# Patient Record
Sex: Male | Born: 1935 | Race: White | Hispanic: No | Marital: Married | Smoking: Former smoker
Health system: Southern US, Community
[De-identification: ages and names within clinical notes are randomized; demographics above are authoritative.]

## PROBLEM LIST (undated history)

## (undated) DIAGNOSIS — K219 Gastro-esophageal reflux disease without esophagitis: Secondary | ICD-10-CM

## (undated) DIAGNOSIS — F329 Major depressive disorder, single episode, unspecified: Secondary | ICD-10-CM

## (undated) DIAGNOSIS — K449 Diaphragmatic hernia without obstruction or gangrene: Secondary | ICD-10-CM

## (undated) DIAGNOSIS — K5790 Diverticulosis of intestine, part unspecified, without perforation or abscess without bleeding: Secondary | ICD-10-CM

## (undated) DIAGNOSIS — D649 Anemia, unspecified: Secondary | ICD-10-CM

## (undated) DIAGNOSIS — M5430 Sciatica, unspecified side: Secondary | ICD-10-CM

## (undated) DIAGNOSIS — F32A Depression, unspecified: Secondary | ICD-10-CM

## (undated) DIAGNOSIS — R972 Elevated prostate specific antigen [PSA]: Secondary | ICD-10-CM

## (undated) DIAGNOSIS — M353 Polymyalgia rheumatica: Secondary | ICD-10-CM

## (undated) DIAGNOSIS — K279 Peptic ulcer, site unspecified, unspecified as acute or chronic, without hemorrhage or perforation: Secondary | ICD-10-CM

## (undated) DIAGNOSIS — F419 Anxiety disorder, unspecified: Secondary | ICD-10-CM

## (undated) DIAGNOSIS — N390 Urinary tract infection, site not specified: Secondary | ICD-10-CM

## (undated) DIAGNOSIS — E538 Deficiency of other specified B group vitamins: Secondary | ICD-10-CM

## (undated) DIAGNOSIS — M069 Rheumatoid arthritis, unspecified: Secondary | ICD-10-CM

## (undated) DIAGNOSIS — D126 Benign neoplasm of colon, unspecified: Secondary | ICD-10-CM

## (undated) DIAGNOSIS — G459 Transient cerebral ischemic attack, unspecified: Secondary | ICD-10-CM

## (undated) DIAGNOSIS — M1990 Unspecified osteoarthritis, unspecified site: Secondary | ICD-10-CM

## (undated) DIAGNOSIS — E785 Hyperlipidemia, unspecified: Secondary | ICD-10-CM

## (undated) DIAGNOSIS — I10 Essential (primary) hypertension: Secondary | ICD-10-CM

## (undated) DIAGNOSIS — I2510 Atherosclerotic heart disease of native coronary artery without angina pectoris: Secondary | ICD-10-CM

## (undated) DIAGNOSIS — K589 Irritable bowel syndrome without diarrhea: Secondary | ICD-10-CM

## (undated) HISTORY — DX: Gastro-esophageal reflux disease without esophagitis: K21.9

## (undated) HISTORY — DX: Sciatica, unspecified side: M54.30

## (undated) HISTORY — DX: Anemia, unspecified: D64.9

## (undated) HISTORY — DX: Deficiency of other specified B group vitamins: E53.8

## (undated) HISTORY — PX: HERNIA REPAIR: SHX51

## (undated) HISTORY — DX: Anxiety disorder, unspecified: F41.9

## (undated) HISTORY — DX: Irritable bowel syndrome, unspecified: K58.9

## (undated) HISTORY — DX: Benign neoplasm of colon, unspecified: D12.6

## (undated) HISTORY — DX: Essential (primary) hypertension: I10

## (undated) HISTORY — DX: Atherosclerotic heart disease of native coronary artery without angina pectoris: I25.10

## (undated) HISTORY — DX: Depression, unspecified: F32.A

## (undated) HISTORY — DX: Elevated prostate specific antigen (PSA): R97.20

## (undated) HISTORY — DX: Polymyalgia rheumatica: M35.3

## (undated) HISTORY — DX: Unspecified osteoarthritis, unspecified site: M19.90

## (undated) HISTORY — DX: Peptic ulcer, site unspecified, unspecified as acute or chronic, without hemorrhage or perforation: K27.9

## (undated) HISTORY — DX: Urinary tract infection, site not specified: N39.0

## (undated) HISTORY — DX: Diaphragmatic hernia without obstruction or gangrene: K44.9

## (undated) HISTORY — DX: Transient cerebral ischemic attack, unspecified: G45.9

## (undated) HISTORY — DX: Major depressive disorder, single episode, unspecified: F32.9

## (undated) HISTORY — DX: Rheumatoid arthritis, unspecified: M06.9

## (undated) HISTORY — DX: Hyperlipidemia, unspecified: E78.5

## (undated) HISTORY — DX: Diverticulosis of intestine, part unspecified, without perforation or abscess without bleeding: K57.90

## (undated) HISTORY — PX: CATARACT EXTRACTION: SUR2

---

## 1998-07-28 DIAGNOSIS — D126 Benign neoplasm of colon, unspecified: Secondary | ICD-10-CM

## 1998-07-28 HISTORY — DX: Benign neoplasm of colon, unspecified: D12.6

## 1998-08-08 ENCOUNTER — Other Ambulatory Visit: Admission: RE | Admit: 1998-08-08 | Discharge: 1998-08-08 | Payer: Self-pay | Admitting: Gastroenterology

## 1999-03-30 HISTORY — PX: CHOLECYSTECTOMY: SHX55

## 1999-08-10 ENCOUNTER — Encounter: Admission: RE | Admit: 1999-08-10 | Discharge: 1999-08-10 | Payer: Self-pay | Admitting: Family Medicine

## 1999-08-10 ENCOUNTER — Encounter: Payer: Self-pay | Admitting: Family Medicine

## 1999-08-14 ENCOUNTER — Ambulatory Visit (HOSPITAL_COMMUNITY): Admission: RE | Admit: 1999-08-14 | Discharge: 1999-08-15 | Payer: Self-pay

## 1999-08-14 ENCOUNTER — Encounter (INDEPENDENT_AMBULATORY_CARE_PROVIDER_SITE_OTHER): Payer: Self-pay | Admitting: Specialist

## 2002-05-01 ENCOUNTER — Encounter: Admission: RE | Admit: 2002-05-01 | Discharge: 2002-05-01 | Payer: Self-pay | Admitting: *Deleted

## 2002-06-14 ENCOUNTER — Ambulatory Visit (HOSPITAL_COMMUNITY): Admission: RE | Admit: 2002-06-14 | Discharge: 2002-06-14 | Payer: Self-pay | Admitting: Gastroenterology

## 2002-06-14 ENCOUNTER — Encounter: Payer: Self-pay | Admitting: Gastroenterology

## 2003-05-01 ENCOUNTER — Inpatient Hospital Stay (HOSPITAL_COMMUNITY): Admission: EM | Admit: 2003-05-01 | Discharge: 2003-05-02 | Payer: Self-pay | Admitting: Emergency Medicine

## 2004-03-29 HISTORY — PX: PTCA: SHX146

## 2004-04-15 ENCOUNTER — Ambulatory Visit: Payer: Self-pay | Admitting: Family Medicine

## 2004-04-17 ENCOUNTER — Ambulatory Visit: Payer: Self-pay | Admitting: Family Medicine

## 2004-05-04 ENCOUNTER — Ambulatory Visit: Payer: Self-pay | Admitting: Cardiology

## 2004-05-05 ENCOUNTER — Ambulatory Visit (HOSPITAL_COMMUNITY): Admission: RE | Admit: 2004-05-05 | Discharge: 2004-05-06 | Payer: Self-pay | Admitting: Cardiology

## 2004-05-05 ENCOUNTER — Ambulatory Visit: Payer: Self-pay | Admitting: Cardiology

## 2004-05-19 ENCOUNTER — Ambulatory Visit: Payer: Self-pay

## 2004-05-27 ENCOUNTER — Ambulatory Visit: Payer: Self-pay | Admitting: Family Medicine

## 2004-06-15 ENCOUNTER — Encounter (HOSPITAL_COMMUNITY): Admission: RE | Admit: 2004-06-15 | Discharge: 2004-09-13 | Payer: Self-pay | Admitting: Cardiology

## 2004-07-16 ENCOUNTER — Ambulatory Visit: Payer: Self-pay | Admitting: Cardiology

## 2004-07-17 ENCOUNTER — Ambulatory Visit: Payer: Self-pay | Admitting: Cardiology

## 2004-07-29 ENCOUNTER — Ambulatory Visit: Payer: Self-pay | Admitting: Family Medicine

## 2004-08-06 ENCOUNTER — Ambulatory Visit: Payer: Self-pay | Admitting: Cardiology

## 2004-08-08 ENCOUNTER — Emergency Department (HOSPITAL_COMMUNITY): Admission: EM | Admit: 2004-08-08 | Discharge: 2004-08-08 | Payer: Self-pay | Admitting: Emergency Medicine

## 2004-08-12 ENCOUNTER — Ambulatory Visit: Payer: Self-pay | Admitting: Family Medicine

## 2004-08-13 ENCOUNTER — Ambulatory Visit: Payer: Self-pay | Admitting: Cardiology

## 2004-08-18 ENCOUNTER — Ambulatory Visit: Payer: Self-pay | Admitting: Internal Medicine

## 2005-01-13 ENCOUNTER — Ambulatory Visit: Payer: Self-pay | Admitting: Family Medicine

## 2005-02-10 ENCOUNTER — Ambulatory Visit: Payer: Self-pay | Admitting: Family Medicine

## 2005-03-17 ENCOUNTER — Ambulatory Visit: Payer: Self-pay | Admitting: Family Medicine

## 2005-03-30 ENCOUNTER — Ambulatory Visit: Payer: Self-pay | Admitting: Cardiology

## 2005-05-12 ENCOUNTER — Ambulatory Visit: Payer: Self-pay | Admitting: Family Medicine

## 2005-05-27 ENCOUNTER — Ambulatory Visit: Payer: Self-pay | Admitting: Family Medicine

## 2005-05-28 ENCOUNTER — Ambulatory Visit: Payer: Self-pay | Admitting: Family Medicine

## 2005-06-01 ENCOUNTER — Ambulatory Visit: Payer: Self-pay | Admitting: Cardiology

## 2005-07-19 ENCOUNTER — Ambulatory Visit: Payer: Self-pay | Admitting: Cardiology

## 2005-09-14 ENCOUNTER — Ambulatory Visit: Payer: Self-pay | Admitting: Family Medicine

## 2005-10-06 ENCOUNTER — Ambulatory Visit: Payer: Self-pay | Admitting: Family Medicine

## 2005-10-28 ENCOUNTER — Ambulatory Visit: Payer: Self-pay | Admitting: Cardiology

## 2005-10-28 ENCOUNTER — Ambulatory Visit: Payer: Self-pay | Admitting: Family Medicine

## 2005-11-02 ENCOUNTER — Ambulatory Visit (HOSPITAL_COMMUNITY): Admission: RE | Admit: 2005-11-02 | Discharge: 2005-11-02 | Payer: Self-pay | Admitting: Urology

## 2005-12-29 ENCOUNTER — Ambulatory Visit: Payer: Self-pay | Admitting: Family Medicine

## 2006-06-23 ENCOUNTER — Ambulatory Visit: Payer: Self-pay | Admitting: Family Medicine

## 2006-06-23 LAB — CONVERTED CEMR LAB
ALT: 38 units/L (ref 0–40)
Bilirubin, Direct: 0.2 mg/dL (ref 0.0–0.3)
CO2: 32 meq/L (ref 19–32)
Calcium: 9.7 mg/dL (ref 8.4–10.5)
Cholesterol: 152 mg/dL (ref 0–200)
Creatinine, Ser: 1.1 mg/dL (ref 0.4–1.5)
GFR calc Af Amer: 83 mL/min
Glucose, Bld: 81 mg/dL (ref 70–99)
Potassium: 4.1 meq/L (ref 3.5–5.1)
Total Bilirubin: 1.5 mg/dL — ABNORMAL HIGH (ref 0.3–1.2)
Total CHOL/HDL Ratio: 4.1
Total Protein: 6.8 g/dL (ref 6.0–8.3)
Triglycerides: 73 mg/dL (ref 0–149)

## 2006-06-26 ENCOUNTER — Encounter: Admission: RE | Admit: 2006-06-26 | Discharge: 2006-06-26 | Payer: Self-pay | Admitting: Family Medicine

## 2006-06-30 ENCOUNTER — Ambulatory Visit: Payer: Self-pay | Admitting: Gastroenterology

## 2006-07-07 ENCOUNTER — Ambulatory Visit: Payer: Self-pay | Admitting: Gastroenterology

## 2006-07-07 ENCOUNTER — Encounter (INDEPENDENT_AMBULATORY_CARE_PROVIDER_SITE_OTHER): Payer: Self-pay | Admitting: Specialist

## 2006-07-13 ENCOUNTER — Ambulatory Visit: Payer: Self-pay | Admitting: Family Medicine

## 2006-07-22 ENCOUNTER — Ambulatory Visit: Payer: Self-pay | Admitting: Cardiology

## 2006-07-26 ENCOUNTER — Encounter: Payer: Self-pay | Admitting: Gastroenterology

## 2006-07-26 ENCOUNTER — Ambulatory Visit: Payer: Self-pay | Admitting: Gastroenterology

## 2006-07-28 ENCOUNTER — Ambulatory Visit: Payer: Self-pay | Admitting: Family Medicine

## 2006-10-19 DIAGNOSIS — E785 Hyperlipidemia, unspecified: Secondary | ICD-10-CM

## 2006-10-19 DIAGNOSIS — I2510 Atherosclerotic heart disease of native coronary artery without angina pectoris: Secondary | ICD-10-CM | POA: Insufficient documentation

## 2006-10-19 DIAGNOSIS — K279 Peptic ulcer, site unspecified, unspecified as acute or chronic, without hemorrhage or perforation: Secondary | ICD-10-CM | POA: Insufficient documentation

## 2006-10-19 DIAGNOSIS — Z8679 Personal history of other diseases of the circulatory system: Secondary | ICD-10-CM

## 2006-10-31 ENCOUNTER — Encounter: Payer: Self-pay | Admitting: Family Medicine

## 2007-02-02 ENCOUNTER — Encounter: Payer: Self-pay | Admitting: Family Medicine

## 2007-02-09 ENCOUNTER — Telehealth: Payer: Self-pay | Admitting: Family Medicine

## 2007-03-20 ENCOUNTER — Ambulatory Visit: Payer: Self-pay | Admitting: Gastroenterology

## 2007-03-30 HISTORY — PX: HIP FRACTURE SURGERY: SHX118

## 2007-04-11 ENCOUNTER — Telehealth: Payer: Self-pay | Admitting: Family Medicine

## 2007-04-25 ENCOUNTER — Inpatient Hospital Stay (HOSPITAL_COMMUNITY): Admission: EM | Admit: 2007-04-25 | Discharge: 2007-04-29 | Payer: Self-pay | Admitting: Emergency Medicine

## 2007-06-22 ENCOUNTER — Ambulatory Visit: Payer: Self-pay | Admitting: Family Medicine

## 2007-06-26 LAB — CONVERTED CEMR LAB
Basophils Absolute: 0.1 10*3/uL (ref 0.0–0.1)
Glucose, Bld: 96 mg/dL (ref 70–99)
Hemoglobin: 13.4 g/dL (ref 13.0–17.0)
Hgb A1c MFr Bld: 5.8 % (ref 4.6–6.0)
Lymphocytes Relative: 27.9 % (ref 12.0–46.0)
MCHC: 32.7 g/dL (ref 30.0–36.0)
Monocytes Relative: 7 % (ref 3.0–11.0)
Neutro Abs: 3.1 10*3/uL (ref 1.4–7.7)
Neutrophils Relative %: 61 % (ref 43.0–77.0)
RDW: 12.1 % (ref 11.5–14.6)

## 2007-07-25 ENCOUNTER — Ambulatory Visit: Payer: Self-pay | Admitting: Cardiology

## 2007-07-28 ENCOUNTER — Encounter: Payer: Self-pay | Admitting: Family Medicine

## 2007-07-28 ENCOUNTER — Ambulatory Visit: Payer: Self-pay

## 2007-07-28 LAB — CONVERTED CEMR LAB
ALT: 22 units/L (ref 0–53)
AST: 23 units/L (ref 0–37)
Albumin: 3.5 g/dL (ref 3.5–5.2)
Alkaline Phosphatase: 105 units/L (ref 39–117)
Cholesterol: 108 mg/dL (ref 0–200)
HDL: 29.4 mg/dL — ABNORMAL LOW (ref >39.0)
Total Protein: 7 g/dL (ref 6.0–8.3)
Triglycerides: 64 mg/dL (ref 0–149)

## 2007-08-02 ENCOUNTER — Ambulatory Visit: Payer: Self-pay | Admitting: Family Medicine

## 2007-08-30 ENCOUNTER — Ambulatory Visit: Payer: Self-pay | Admitting: Family Medicine

## 2007-10-09 ENCOUNTER — Ambulatory Visit: Payer: Self-pay | Admitting: Cardiology

## 2007-10-09 LAB — CONVERTED CEMR LAB
ALT: 24 units/L (ref 0–53)
Alkaline Phosphatase: 74 units/L (ref 39–117)
Bilirubin, Direct: 0.2 mg/dL (ref 0.0–0.3)
Cholesterol: 123 mg/dL (ref 0–200)
Total Bilirubin: 1.2 mg/dL (ref 0.3–1.2)
Total Protein: 6.8 g/dL (ref 6.0–8.3)
VLDL: 15 mg/dL (ref 0–40)

## 2007-11-21 ENCOUNTER — Ambulatory Visit: Payer: Self-pay | Admitting: Family Medicine

## 2007-11-22 ENCOUNTER — Ambulatory Visit: Payer: Self-pay | Admitting: Family Medicine

## 2007-11-22 LAB — CONVERTED CEMR LAB
LDL Cholesterol: 66 mg/dL (ref 0–99)
Total CHOL/HDL Ratio: 3.8
Triglycerides: 111 mg/dL (ref 0–149)

## 2007-11-23 ENCOUNTER — Encounter: Payer: Self-pay | Admitting: Family Medicine

## 2007-11-24 ENCOUNTER — Encounter: Admission: RE | Admit: 2007-11-24 | Discharge: 2007-11-24 | Payer: Self-pay | Admitting: Otolaryngology

## 2007-11-24 LAB — CONVERTED CEMR LAB
Eosinophils Relative: 2 % (ref 0.0–5.0)
HCT: 45.5 % (ref 39.0–52.0)
Hemoglobin: 15.4 g/dL (ref 13.0–17.0)
Lymphocytes Relative: 18.2 % (ref 12.0–46.0)
Monocytes Relative: 5.6 % (ref 3.0–12.0)
Neutro Abs: 6.2 10*3/uL (ref 1.4–7.7)
Platelets: 140 10*3/uL — ABNORMAL LOW (ref 150–400)
RDW: 12.5 % (ref 11.5–14.6)
WBC: 8.5 10*3/uL (ref 4.5–10.5)

## 2007-11-28 HISTORY — PX: SALIVARY STONE REMOVAL: SHX5213

## 2007-11-30 ENCOUNTER — Encounter (INDEPENDENT_AMBULATORY_CARE_PROVIDER_SITE_OTHER): Payer: Self-pay | Admitting: Otolaryngology

## 2007-11-30 ENCOUNTER — Ambulatory Visit (HOSPITAL_COMMUNITY): Admission: RE | Admit: 2007-11-30 | Discharge: 2007-12-01 | Payer: Self-pay | Admitting: Otolaryngology

## 2007-12-02 ENCOUNTER — Emergency Department (HOSPITAL_COMMUNITY): Admission: EM | Admit: 2007-12-02 | Discharge: 2007-12-02 | Payer: Self-pay | Admitting: Emergency Medicine

## 2007-12-05 ENCOUNTER — Telehealth: Payer: Self-pay | Admitting: Family Medicine

## 2008-01-23 ENCOUNTER — Ambulatory Visit: Payer: Self-pay | Admitting: Family Medicine

## 2008-01-23 DIAGNOSIS — M479 Spondylosis, unspecified: Secondary | ICD-10-CM | POA: Insufficient documentation

## 2008-01-25 ENCOUNTER — Ambulatory Visit: Payer: Self-pay | Admitting: Cardiology

## 2008-02-13 ENCOUNTER — Ambulatory Visit: Payer: Self-pay | Admitting: Family Medicine

## 2008-02-13 LAB — CONVERTED CEMR LAB
Bilirubin Urine: NEGATIVE
Blood in Urine, dipstick: NEGATIVE
Glucose, Urine, Semiquant: NEGATIVE
Urobilinogen, UA: 0.2
pH: 6.5

## 2008-02-14 ENCOUNTER — Encounter: Admission: RE | Admit: 2008-02-14 | Discharge: 2008-02-14 | Payer: Self-pay | Admitting: Family Medicine

## 2008-02-14 LAB — CONVERTED CEMR LAB
CO2: 33 meq/L — ABNORMAL HIGH (ref 19–32)
Chloride: 107 meq/L (ref 96–112)
GFR calc Af Amer: 92 mL/min
GFR calc non Af Amer: 76 mL/min
Glucose, Bld: 74 mg/dL (ref 70–99)
Sodium: 144 meq/L (ref 135–145)

## 2008-02-16 LAB — CONVERTED CEMR LAB
Albumin: 3.4 g/dL — ABNORMAL LOW (ref 3.5–5.2)
Calcium: 9.3 mg/dL (ref 8.4–10.5)
Folate: 10.1 ng/mL
GFR calc Af Amer: 92 mL/min
GFR calc non Af Amer: 76 mL/min
Glucose, Bld: 74 mg/dL (ref 70–99)
Phosphorus: 2.7 mg/dL (ref 2.3–4.6)
Sodium: 144 meq/L (ref 135–145)
Vitamin B-12: 170 pg/mL — ABNORMAL LOW (ref 211–911)

## 2008-02-20 ENCOUNTER — Ambulatory Visit: Payer: Self-pay | Admitting: Family Medicine

## 2008-02-27 ENCOUNTER — Ambulatory Visit: Payer: Self-pay | Admitting: Family Medicine

## 2008-02-27 DIAGNOSIS — R972 Elevated prostate specific antigen [PSA]: Secondary | ICD-10-CM

## 2008-02-27 DIAGNOSIS — F341 Dysthymic disorder: Secondary | ICD-10-CM | POA: Insufficient documentation

## 2008-02-28 ENCOUNTER — Ambulatory Visit: Payer: Self-pay | Admitting: Cardiology

## 2008-03-01 ENCOUNTER — Ambulatory Visit: Payer: Self-pay | Admitting: Family Medicine

## 2008-03-01 ENCOUNTER — Ambulatory Visit: Payer: Self-pay | Admitting: Cardiology

## 2008-03-05 ENCOUNTER — Ambulatory Visit: Payer: Self-pay | Admitting: Family Medicine

## 2008-03-05 LAB — CONVERTED CEMR LAB: PSA: 4.89 ng/mL — ABNORMAL HIGH (ref 0.10–4.00)

## 2008-03-06 ENCOUNTER — Ambulatory Visit: Payer: Self-pay | Admitting: Gastroenterology

## 2008-03-06 DIAGNOSIS — Z860100 Personal history of colon polyps, unspecified: Secondary | ICD-10-CM | POA: Insufficient documentation

## 2008-03-06 DIAGNOSIS — K219 Gastro-esophageal reflux disease without esophagitis: Secondary | ICD-10-CM

## 2008-03-13 ENCOUNTER — Ambulatory Visit: Payer: Self-pay | Admitting: Family Medicine

## 2008-03-14 ENCOUNTER — Ambulatory Visit: Payer: Self-pay | Admitting: Cardiology

## 2008-03-14 LAB — CONVERTED CEMR LAB
Albumin: 3.7 g/dL (ref 3.5–5.2)
Alkaline Phosphatase: 68 units/L (ref 39–117)
LDL Cholesterol: 71 mg/dL (ref 0–99)
Total Bilirubin: 0.7 mg/dL (ref 0.3–1.2)
Total CHOL/HDL Ratio: 3.5
Triglycerides: 60 mg/dL (ref 0–149)

## 2008-03-26 ENCOUNTER — Encounter: Payer: Self-pay | Admitting: Family Medicine

## 2008-03-26 ENCOUNTER — Ambulatory Visit: Payer: Self-pay | Admitting: Internal Medicine

## 2008-04-11 ENCOUNTER — Ambulatory Visit: Payer: Self-pay | Admitting: Family Medicine

## 2008-05-15 ENCOUNTER — Ambulatory Visit: Payer: Self-pay | Admitting: Family Medicine

## 2008-06-24 ENCOUNTER — Telehealth: Payer: Self-pay | Admitting: Gastroenterology

## 2008-06-25 ENCOUNTER — Ambulatory Visit: Payer: Self-pay | Admitting: Internal Medicine

## 2008-06-25 DIAGNOSIS — K5730 Diverticulosis of large intestine without perforation or abscess without bleeding: Secondary | ICD-10-CM | POA: Insufficient documentation

## 2008-07-08 ENCOUNTER — Telehealth: Payer: Self-pay | Admitting: Gastroenterology

## 2008-10-15 ENCOUNTER — Encounter (INDEPENDENT_AMBULATORY_CARE_PROVIDER_SITE_OTHER): Payer: Self-pay | Admitting: *Deleted

## 2008-10-16 ENCOUNTER — Telehealth: Payer: Self-pay | Admitting: Cardiology

## 2008-11-28 ENCOUNTER — Ambulatory Visit: Payer: Self-pay | Admitting: Cardiology

## 2009-02-25 ENCOUNTER — Ambulatory Visit: Payer: Self-pay | Admitting: Family Medicine

## 2009-03-06 ENCOUNTER — Telehealth: Payer: Self-pay | Admitting: Family Medicine

## 2009-03-07 ENCOUNTER — Telehealth: Payer: Self-pay | Admitting: Gastroenterology

## 2009-03-11 ENCOUNTER — Ambulatory Visit: Payer: Self-pay | Admitting: Gastroenterology

## 2009-04-09 ENCOUNTER — Ambulatory Visit: Payer: Self-pay | Admitting: Gastroenterology

## 2009-06-17 ENCOUNTER — Telehealth: Payer: Self-pay | Admitting: Cardiology

## 2009-06-18 ENCOUNTER — Telehealth (INDEPENDENT_AMBULATORY_CARE_PROVIDER_SITE_OTHER): Payer: Self-pay | Admitting: *Deleted

## 2009-06-19 ENCOUNTER — Ambulatory Visit: Payer: Self-pay

## 2009-06-19 ENCOUNTER — Ambulatory Visit: Payer: Self-pay | Admitting: Internal Medicine

## 2009-06-19 ENCOUNTER — Encounter (HOSPITAL_COMMUNITY): Admission: RE | Admit: 2009-06-19 | Discharge: 2009-08-27 | Payer: Self-pay | Admitting: Cardiology

## 2009-06-25 ENCOUNTER — Encounter: Payer: Self-pay | Admitting: Cardiology

## 2009-06-26 ENCOUNTER — Encounter: Payer: Self-pay | Admitting: Family Medicine

## 2009-06-27 ENCOUNTER — Ambulatory Visit: Payer: Self-pay | Admitting: Cardiology

## 2009-07-03 ENCOUNTER — Telehealth: Payer: Self-pay | Admitting: Gastroenterology

## 2009-07-09 ENCOUNTER — Ambulatory Visit: Payer: Self-pay | Admitting: Family Medicine

## 2009-07-09 DIAGNOSIS — E039 Hypothyroidism, unspecified: Secondary | ICD-10-CM | POA: Insufficient documentation

## 2009-07-09 DIAGNOSIS — H3530 Unspecified macular degeneration: Secondary | ICD-10-CM | POA: Insufficient documentation

## 2009-07-09 DIAGNOSIS — D649 Anemia, unspecified: Secondary | ICD-10-CM

## 2009-07-09 LAB — CONVERTED CEMR LAB
Blood in Urine, dipstick: NEGATIVE
Ketones, urine, test strip: NEGATIVE
Nitrite: NEGATIVE
Specific Gravity, Urine: >=1.03

## 2009-07-10 LAB — CONVERTED CEMR LAB
Alkaline Phosphatase: 92 units/L (ref 39–117)
Basophils Absolute: 0 10*3/uL (ref 0.0–0.1)
Bilirubin, Direct: 0.2 mg/dL (ref 0.0–0.3)
CO2: 30 meq/L (ref 19–32)
Calcium: 9.5 mg/dL (ref 8.4–10.5)
Creatinine, Ser: 1.2 mg/dL (ref 0.4–1.5)
Eosinophils Absolute: 0.2 10*3/uL (ref 0.0–0.7)
Glucose, Bld: 87 mg/dL (ref 70–99)
HDL: 42.3 mg/dL (ref >39.00)
Lymphocytes Relative: 24 % (ref 12.0–46.0)
MCHC: 34.1 g/dL (ref 30.0–36.0)
Neutrophils Relative %: 66 % (ref 43.0–77.0)
RDW: 13.5 % (ref 11.5–14.6)
Total CHOL/HDL Ratio: 2
Triglycerides: 47 mg/dL (ref 0.0–149.0)

## 2009-07-14 ENCOUNTER — Encounter: Payer: Self-pay | Admitting: Family Medicine

## 2009-07-15 ENCOUNTER — Ambulatory Visit: Payer: Self-pay | Admitting: Family Medicine

## 2009-07-15 LAB — CONVERTED CEMR LAB
OCCULT 1: NEGATIVE
OCCULT 3: NEGATIVE
PSA: 4.08 ng/mL — ABNORMAL HIGH (ref 0.10–4.00)

## 2009-07-16 ENCOUNTER — Telehealth: Payer: Self-pay | Admitting: Gastroenterology

## 2009-07-23 ENCOUNTER — Ambulatory Visit: Payer: Self-pay | Admitting: Family Medicine

## 2009-07-23 DIAGNOSIS — L719 Rosacea, unspecified: Secondary | ICD-10-CM

## 2009-07-23 DIAGNOSIS — R1310 Dysphagia, unspecified: Secondary | ICD-10-CM | POA: Insufficient documentation

## 2009-07-29 ENCOUNTER — Telehealth: Payer: Self-pay | Admitting: Family Medicine

## 2009-07-30 ENCOUNTER — Ambulatory Visit: Payer: Self-pay | Admitting: Gastroenterology

## 2009-08-06 ENCOUNTER — Ambulatory Visit: Payer: Self-pay | Admitting: Gastroenterology

## 2009-08-10 ENCOUNTER — Encounter: Payer: Self-pay | Admitting: Family Medicine

## 2009-08-11 ENCOUNTER — Encounter: Payer: Self-pay | Admitting: Gastroenterology

## 2009-08-12 ENCOUNTER — Ambulatory Visit: Payer: Self-pay | Admitting: Family Medicine

## 2009-08-21 ENCOUNTER — Ambulatory Visit: Payer: Self-pay | Admitting: Family Medicine

## 2009-08-22 ENCOUNTER — Telehealth: Payer: Self-pay | Admitting: Family Medicine

## 2009-09-18 ENCOUNTER — Ambulatory Visit: Payer: Self-pay | Admitting: Family Medicine

## 2009-11-11 ENCOUNTER — Ambulatory Visit: Payer: Self-pay | Admitting: Family Medicine

## 2009-12-23 ENCOUNTER — Ambulatory Visit: Payer: Self-pay | Admitting: Family Medicine

## 2009-12-23 DIAGNOSIS — B370 Candidal stomatitis: Secondary | ICD-10-CM

## 2010-02-05 ENCOUNTER — Ambulatory Visit: Payer: Self-pay | Admitting: Family Medicine

## 2010-02-05 DIAGNOSIS — R030 Elevated blood-pressure reading, without diagnosis of hypertension: Secondary | ICD-10-CM

## 2010-02-05 LAB — CONVERTED CEMR LAB
AST: 23 units/L (ref 0–37)
Albumin: 3.4 g/dL — ABNORMAL LOW (ref 3.5–5.2)
Alkaline Phosphatase: 91 units/L (ref 39–117)
Basophils Absolute: 0 10*3/uL (ref 0.0–0.1)
Bilirubin, Direct: 0.1 mg/dL (ref 0.0–0.3)
Eosinophils Relative: 3 % (ref 0.0–5.0)
GFR calc non Af Amer: 76.25 mL/min (ref >60)
Glucose, Bld: 111 mg/dL — ABNORMAL HIGH (ref 70–99)
MCV: 91.6 fL (ref 78.0–100.0)
Monocytes Absolute: 0.2 10*3/uL (ref 0.1–1.0)
Neutrophils Relative %: 64.1 % (ref 43.0–77.0)
Platelets: 159 10*3/uL (ref 150.0–400.0)
Potassium: 3.6 meq/L (ref 3.5–5.1)
Sodium: 143 meq/L (ref 135–145)
Total Bilirubin: 0.5 mg/dL (ref 0.3–1.2)
WBC: 5.1 10*3/uL (ref 4.5–10.5)

## 2010-02-11 ENCOUNTER — Telehealth: Payer: Self-pay | Admitting: Family Medicine

## 2010-03-16 ENCOUNTER — Telehealth: Payer: Self-pay | Admitting: Gastroenterology

## 2010-03-16 ENCOUNTER — Ambulatory Visit: Payer: Self-pay | Admitting: Internal Medicine

## 2010-04-16 ENCOUNTER — Ambulatory Visit
Admission: RE | Admit: 2010-04-16 | Discharge: 2010-04-16 | Payer: Self-pay | Source: Home / Self Care | Attending: Family Medicine | Admitting: Family Medicine

## 2010-04-16 DIAGNOSIS — R05 Cough: Secondary | ICD-10-CM | POA: Insufficient documentation

## 2010-04-16 DIAGNOSIS — R413 Other amnesia: Secondary | ICD-10-CM | POA: Insufficient documentation

## 2010-04-22 ENCOUNTER — Telehealth: Payer: Self-pay | Admitting: Family Medicine

## 2010-04-30 NOTE — Letter (Signed)
Summary: EGD Instructions  [facility]  [address] [place], [place] [address]   Phone: [phone]  Fax: [phone]       [name]    1923/02/10    MRN: [id number]       Procedure Day Dorna Bloom: Wednesday May 11th, 2011     Arrival Time:  2:30pm     Procedure Time: 3:30pm     Location of Procedure:                    _ x _ [facility] (4th Floor)    PREPARATION FOR ENDOSCOPY   On 08/06/09 THE DAY OF THE PROCEDURE:  1.   No solid foods, milk or milk products are allowed after midnight the night before your procedure.  2.   Do not drink anything colored red or purple.  Avoid juices with pulp.  No orange juice.  3.  You may drink clear liquids until 1:30pm, which is 2 hours before your procedure.                                                                                                CLEAR LIQUIDS INCLUDE: Water Jello Ice Popsicles Tea (sugar ok, no milk/cream) Powdered fruit flavored drinks Coffee (sugar ok, no milk/cream) Gatorade Juice: apple, white grape, white cranberry  Lemonade Clear bullion, consomm, broth Carbonated beverages (any kind) Strained chicken noodle soup Hard Candy   MEDICATION INSTRUCTIONS  Unless otherwise instructed, you should take regular prescription medications with a small sip of water as early as possible the morning of your procedure.         OTHER INSTRUCTIONS  You will need a responsible adult at least 75 years of age to accompany you and drive you home.   This person must remain in the waiting room during your procedure.  Wear loose fitting clothing that is easily removed.  Leave jewelry and other valuables at home.  However, you may wish to bring a book to read or an iPod/MP3 player to listen to music as you wait for your procedure to start.  Remove all body piercing jewelry and leave at home.  Total time from sign-in until discharge is approximately 2-3 hours.  You should go home  directly after your procedure and rest.  You can resume normal activities the day after your procedure.  The day of your procedure you should not:   Drive   Make legal decisions   Operate machinery   Drink alcohol   Return to work  You will receive specific instructions about eating, activities and medications before you leave.    The above instructions have been reviewed and explained to me by   Marchelle Folks.     I fully understand and can verbalize these instructions _____________________________ Date _________

## 2010-04-30 NOTE — Assessment & Plan Note (Signed)
Summary: wife concerned about meds/pt rsc/cjr   Vital Signs:  Patient profile:   77 year old male Weight:      168 pounds BMI:     23.52 O2 Sat:      98 % Temp:     98 degrees F Pulse rate:   65 / minute BP sitting:   120 / 70  (left arm)  Vitals Entered By: Pura Spice, RN (Aug 12, 2009 11:53 AM) CC: discuss visit from dr hochrein and dr stark. wife concerned    History of Present Illness: This 77 year old white male is in with his wife to discuss his medical problems and also her concern regarding taking too many medications. She also relates that he is for anxious apprehensive and many times will raise his voice to her We will go over one medicine at a time and decide if there is any that we can leave off We have discussed his office visit with Dr. Margarite Gouge which reveals that he is not having any ischemia or cardiac problem causing any chest pain but relates the pain to esophageal spasm or GERD He has had upper endoscopy by Dr. Russella Dar which only revealed a hiatal hernia and has had esophagitis in the past but is relieved with Nexium b.i.d. She is anxiety depression has improved slightly with Lexapro 10 mg each day but it seems it needs to be increased in dosage No other complaints  Allergies: 1)  ! * Pcn Derivatives 2)  ! * Int. Crestor 3)  Amoxicillin (Amoxicillin) 4)  Erythromycin (Erythromycin)  Past History:  Past Medical History: Last updated: 04/09/2009 CONSTIPATION (ICD-564.00) IRRITABLE BOWEL SYNDROME (ICD-564.1) HEMORRHOIDS (ICD-455.6) DIVERTICULAR DISEASE (ICD-562.10) GASTRITIS (ICD-535.50) PERSONAL HX COLONIC POLYPS (ICD-V12.72) GERD (ICD-530.81) ANXIETY DEPRESSION (ICD-300.4) ELEVATED PROSTATE SPECIFIC ANTIGEN (ICD-790.93) VITAMIN B12 DEFICIENCY (ICD-266.2) UTI (ICD-599.0) ARTHRITIS, CERVICAL SPINE (ICD-721.90) SCIATICA (ICD-724.3) ANEMIA (ICD-285.9) TRANSIENT ISCHEMIC ATTACK, HX OF (ICD-V12.50) PEPTIC ULCER DISEASE (ICD-533.90) HYPERLIPIDEMIA  (ICD-272.4) CORONARY ARTERY DISEASE (ICD-414.00) (PTCA and stenting of proximal LAD with 75%     stenosis reduced to 0%.  This was in 2006.  Stress perfusion study     in April 2009 demonstrated well-preserved ejection fraction with no     evidence of ischemia or infarct)  Past Surgical History: Last updated: 04/09/2009 Cataract extraction-left eye Cholecystectomy Hernia Surgery Hip surgery after fracture 1/09-right Rt. sub. mand. gland extraction 9/09 Stent-05/06/2004 EGD-07/26/2006 Colonoscopy-07/07/2006  Social History: Last updated: 04/09/2009 Retired  Married Former Smoker-stopped 1974 Alcohol use-yes-occasional Drug use-no Daily Caffeine Use 1 cup/day Patient gets regular exercise.  Risk Factors: Smoking Status: quit (10/19/2006)  Review of Systems  The patient denies anorexia, fever, weight loss, weight gain, vision loss, decreased hearing, hoarseness, chest pain, syncope, dyspnea on exertion, peripheral edema, prolonged cough, headaches, hemoptysis, abdominal pain, melena, hematochezia, severe indigestion/heartburn, hematuria, incontinence, genital sores, muscle weakness, suspicious skin lesions, transient blindness, difficulty walking, depression, unusual weight change, abnormal bleeding, enlarged lymph nodes, angioedema, breast masses, and testicular masses.    Physical Exam  General:  Well-developed,well-nourished,in no acute distress; alert,appropriate and cooperative throughout examination Lungs:  Normal respiratory effort, chest expands symmetrically. Lungs are clear to auscultation, no crackles or wheezes. Heart:  Normal rate and regular rhythm. S1 and S2 normal without gallop, murmur, click, rub or other extra sounds. Abdomen:  Bowel sounds positive,abdomen soft and non-tender without masses, organomegaly or hernias noted. Skin:  rosacea has improved but continues to persist Psych:  appears anxious at times but also depressed   Impression &  Recommendations:  Problem # 1:  ROSACEA (ICD-695.3) Assessment Improved  Problem # 2:  DYSPHAGIA UNSPECIFIED (ICD-787.20) Assessment: Improved  Problem # 3:  ANXIETY DEPRESSION (ICD-300.4) Assessment: Unchanged increase Lexapro to 20 mg q. day  Problem # 4:  CORONARY ARTERY DISEASE (ICD-414.00) Assessment: Improved  His updated medication list for this problem includes:    Metoprolol Tartrate 25 Mg Tabs (Metoprolol tartrate) .Marland Kitchen... Take 1 tablet by mouth once a day    Nitroquick 0.4 Mg Subl (Nitroglycerin) ..... Use as directed    Bayer Aspirin Ec Low Dose 81 Mg Tbec (Aspirin) .Marland Kitchen... 2 po two times a day  Problem # 5:  GERD (ICD-530.81) Assessment: Improved  His updated medication list for this problem includes:    Nexium 40 Mg Cpdr (Esomeprazole magnesium) .Marland Kitchen... Take 1 capsule by mouth two times a day  Complete Medication List: 1)  Metoprolol Tartrate 25 Mg Tabs (Metoprolol tartrate) .... Take 1 tablet by mouth once a day 2)  Nexium 40 Mg Cpdr (Esomeprazole magnesium) .... Take 1 capsule by mouth two times a day 3)  Nitroquick 0.4 Mg Subl (Nitroglycerin) .... Use as directed 4)  Bayer Aspirin Ec Low Dose 81 Mg Tbec (Aspirin) .... 2 po two times a day 5)  Lipitor 40 Mg Tabs (Atorvastatin calcium) .... Once daily 6)  Tricor 145 Mg Tabs (Fenofibrate) .... Daily 7)  Miralax Powd (Polyethylene glycol 3350) .Marland Kitchen.. 1 measure  each day am or pm for a regular bm 8)  Ocuvite Preservision Tabs (Multiple vitamins-minerals) 9)  Lexapro 10 Mg Tabs (Escitalopram oxalate) .Marland Kitchen.. 1 once daily for anxiety and depression 10)  Vitamin D (ergocalciferol) 50000 Unit Caps (Ergocalciferol) .Marland KitchenMarland KitchenMarland Kitchen 1 weekly for 12 weeks 11)  Triamcinolone in Absorbase 0.05 % Oint (Triamcinolone acetonide) .... Apply once daily to affected area 12)  Cetaphil Defense Spf 50 Crea (Sunscreens) .... Apply daily 13)  Lexapro 20 Mg Tabs (Escitalopram oxalate) .Marland Kitchen.. 1 once daily for anxiety depression  Patient Instructions: 1)   Reviewed all her medications I feel that we cannot this continue any of them but increased Lexapro 20 mg 2)  Continue your medications as prescribed 3)  Return in 6 weeks to evaluate pain on a depression Prescriptions: LEXAPRO 20 MG TABS (ESCITALOPRAM OXALATE) 1 once daily for anxiety depression  #30 x 11   Entered and Authorized by:   Judithann Sheen MD   Signed by:   Judithann Sheen MD on 08/12/2009   Method used:   Electronically to        [id number] (retail)       [address]       [place], [place]  [address]       Ph: [phone] or [phone]       Fax: [phone]   RxID:   [id number]

## 2010-04-30 NOTE — Assessment & Plan Note (Signed)
Summary: cough/dm   Vital Signs:  Patient profile:   77 year old male Weight:      181 pounds O2 Sat:      96 % on Room air Temp:     98.4 degrees F oral Pulse rate:   70 / minute Pulse rhythm:   regular BP sitting:   120 / 80  (left arm) Cuff size:   regular  Vitals Entered By: Romualdo Bolk, CMA (AAMA) (April 16, 2010 2:12 PM)  O2 Flow:  Room air CC: Coughing x 3 weeks, lump on throat when eats and congestion   History of Present Illness: This 77 year old white married male he is in today complaining of cough for approximately 3 weeks. He had denied cough but he feels that there's a lump in his throat also the cough was related to heat after eating he will have a call. He also has a problem with nasal congestion and postnasal drainage which also aggravates a call he does take Mucinex DM with some help here didn't help him he take Nexium 40 mg b.i.d. to help esophageal reflux with associated cough Another problem he has been concerned about his memory and in fact it he got lost colon to the gym recently he could not find his wife this has happened worrying frequently but we discussed the fact for him to think about the possibility of taking the medication as there is  Current Medications (verified): 1)  Metoprolol Tartrate 25 Mg Tabs (Metoprolol Tartrate) .... Take 1 Tablet By Mouth Once A Day 2)  Nexium 40 Mg Cpdr (Esomeprazole Magnesium) .... Take 1 Capsule By Mouth Two Times A Day 3)  Nitroquick 0.4 Mg Subl (Nitroglycerin) .... Use As Directed 4)  Bayer Aspirin Ec Low Dose 81 Mg Tbec (Aspirin) .... 2 Po Two Times A Day 5)  Lipitor 40 Mg Tabs (Atorvastatin Calcium) .... Once Daily 6)  Tricor 145 Mg Tabs (Fenofibrate) .... Daily 7)  Miralax  Powd (Polyethylene Glycol 3350) .Marland Kitchen.. 1 Measure  Each Day Am or Pm For A Regular Bm 8)  Ocuvite Preservision  Tabs (Multiple Vitamins-Minerals) 9)  Triamcinolone in Absorbase 0.05 % Oint (Triamcinolone Acetonide) .... Apply Once Daily To  Affected Area 10)  Cetaphil Defense Spf 50  Crea (Sunscreens) .... Apply Daily 11)  Mucinex Fast-Max Cold & Sinus 10-650-400 Mg/40ml Liqd (Phenylephrine-Apap-Guaifenesin)  Allergies (verified): 1)  ! * Pcn Derivatives 2)  ! * Int. Crestor 3)  ! * Doxycycline 4)  ! Bactrim 5)  Amoxicillin (Amoxicillin) 6)  Erythromycin (Erythromycin)  Past History:  Past Medical History: Last updated: 04/09/2009 CONSTIPATION (ICD-564.00) IRRITABLE BOWEL SYNDROME (ICD-564.1) HEMORRHOIDS (ICD-455.6) DIVERTICULAR DISEASE (ICD-562.10) GASTRITIS (ICD-535.50) PERSONAL HX COLONIC POLYPS (ICD-V12.72) GERD (ICD-530.81) ANXIETY DEPRESSION (ICD-300.4) ELEVATED PROSTATE SPECIFIC ANTIGEN (ICD-790.93) VITAMIN B12 DEFICIENCY (ICD-266.2) UTI (ICD-599.0) ARTHRITIS, CERVICAL SPINE (ICD-721.90) SCIATICA (ICD-724.3) ANEMIA (ICD-285.9) TRANSIENT ISCHEMIC ATTACK, HX OF (ICD-V12.50) PEPTIC ULCER DISEASE (ICD-533.90) HYPERLIPIDEMIA (ICD-272.4) CORONARY ARTERY DISEASE (ICD-414.00) (PTCA and stenting of proximal LAD with 75%     stenosis reduced to 0%.  This was in 2006.  Stress perfusion study     in April 2009 demonstrated well-preserved ejection fraction with no     evidence of ischemia or infarct)  Past Surgical History: Last updated: 04/09/2009 Cataract extraction-left eye Cholecystectomy Hernia Surgery Hip surgery after fracture 1/09-right Rt. sub. mand. gland extraction 9/09 Stent-05/06/2004 EGD-07/26/2006 Colonoscopy-07/07/2006  Family History: Last updated: 04/09/2009 Family History Hypertension Family History of Cardiovascular disorder Family History of Colon Cancer: Mat. Uncle ?50  yo Family History of Coronary Artery Disease: Father  Social History: Last updated: 04/09/2009 Retired  Married Former Smoker-stopped 1974 Alcohol use-yes-occasional Drug use-no Daily Caffeine Use 1 cup/day Patient gets regular exercise.  Risk Factors: Smoking Status: quit (10/19/2006)  Review of Systems       See HPI  Physical Exam  General:  Well-developed,well-nourished,in no acute distress; alert,appropriate and cooperative throughout examination Head:  Normocephalic and atraumatic without obvious abnormalities. No apparent alopecia or balding. Eyes:  No corneal or conjunctival inflammation noted. EOMI. Perrla. Funduscopic exam benign, without hemorrhages, exudates or papilledema. Vision grossly normal. Ears:  External ear exam shows no significant lesions or deformities.  Otoscopic examination reveals clear canals, tympanic membranes are intact bilaterally without bulging, retraction, inflammation or discharge. Hearing is grossly normal bilaterally. Nose:  nasal mucosa boggy swollen draining clear fluid some nasal congestion Mouth:  postnasal drainage Neck:  No deformities, masses, or tenderness noted. Lungs:  Normal respiratory effort, chest expands symmetrically. Lungs are clear to auscultation, no crackles or wheezes. Heart:  Normal rate and regular rhythm. S1 and S2 normal without gallop, murmur, click, rub or other extra sounds. Abdomen:  Bowel sounds positive,abdomen soft and non-tender without masses, organomegaly or hernias noted.   Impression & Recommendations:  Problem # 1:  MEMORY LOSS (ICD-780.93) Assessment New  Aricept 5 mg for one month and increase to 10 mg  Orders: Prescription Created Electronically [phone])  Problem # 2:  COUGH (ICD-786.2) Assessment: Deteriorated  Problem # 3:  MACULAR DEGENERATION (ICD-362.50) Assessment: Unchanged  Problem # 4:  GERD (ICD-530.81)  His updated medication list for this problem includes:    Nexium 40 Mg Cpdr (Esomeprazole magnesium) .Marland Kitchen... Take 1 capsule by mouth two times a day  Problem # 5:  CORONARY ARTERY DISEASE (ICD-414.00) Assessment: Improved  His updated medication list for this problem includes:    Metoprolol Tartrate 25 Mg Tabs (Metoprolol tartrate) .Marland Kitchen... Take 1 tablet by mouth once a day    Nitroquick 0.4 Mg  Subl (Nitroglycerin) ..... Use as directed    Bayer Aspirin Ec Low Dose 81 Mg Tbec (Aspirin) .Marland Kitchen... 2 po two times a day  Complete Medication List: 1)  Metoprolol Tartrate 25 Mg Tabs (Metoprolol tartrate) .... Take 1 tablet by mouth once a day 2)  Nexium 40 Mg Cpdr (Esomeprazole magnesium) .... Take 1 capsule by mouth two times a day 3)  Nitroquick 0.4 Mg Subl (Nitroglycerin) .... Use as directed 4)  Bayer Aspirin Ec Low Dose 81 Mg Tbec (Aspirin) .... 2 po two times a day 5)  Lipitor 40 Mg Tabs (Atorvastatin calcium) .... Once daily 6)  Tricor 145 Mg Tabs (Fenofibrate) .... Daily 7)  Miralax Powd (Polyethylene glycol 3350) .Marland Kitchen.. 1 measure  each day am or pm for a regular bm 8)  Ocuvite Preservision Tabs (Multiple vitamins-minerals) 9)  Triamcinolone in Absorbase 0.05 % Oint (Triamcinolone acetonide) .... Apply once daily to affected area 10)  Cetaphil Defense Spf 50 Crea (Sunscreens) .... Apply daily 11)  Mucinex Fast-max Cold & Sinus 10-650-400 Mg/2ml Liqd (Phenylephrine-apap-guaifenesin) 12)  Nasonex 50 Mcg/act Susp (Mometasone furoate) .... 2 sprays eavh nostril qd 13)  Medrol (pak) 4 Mg Tabs (Methylprednisolone) .... As directed package 14)  Aricept 5 Mg Tabs (Donepezil hcl) .Marland Kitchen.. 1 once daily then reill with 10mg  tablets 15)  Hydromet 5-1.5 Mg/78ml Syrp (Hydrocodone-homatropine) .Marland Kitchen.. 1-2 tsp q4h as needed for cough  Patient Instructions: 1)  cOUGH SECONDARY TO POSTNAL DRAINAGE AS WELL AS RELATED TO ESOPHAGEAL REFLUX 2)  hYDROMET  COUGH EXPECTORANT 3)  MEDROL DOSEPAK 4)  sTART USING NASONEX DAILY 2 SPRAYS EACH NOSTRIL 5)  TO START ARICEPT 10 MG EACH DAY TO HELP MEMORY LOSS 6)  CONTINUE TAKING MUCINEX dm Prescriptions: HYDROMET 5-1.5 MG/5ML SYRP (HYDROCODONE-HOMATROPINE) 1-2 TSP Q4H as needed FOR COUGH  #230CC x 2   Entered and Authorized by:   Judithann Sheen MD   Signed by:   Judithann Sheen MD on 04/16/2010   Method used:   Print then Give to Patient   RxID:    [id number] ARICEPT 5 MG TABS (DONEPEZIL HCL) 1 once daily THEN REILL WITH 10MG  TABLETS  #30 x 11   Entered and Authorized by:   Judithann Sheen MD   Signed by:   Judithann Sheen MD on 04/16/2010   Method used:   Electronically to        [id number] ([facility]ail)       [address]       [place], [place]  [address]       Ph: [phone] or [phone]       Fax: [phone]   RxID:   [id number] MEDROL (PAK) 4 MG TABS (METHYLPREDNISOLONE) AS DIRECTED PACKAGE  #1 x 1   Entered and Authorized by:   Judithann Sheen MD   Signed by:   Judithann Sheen MD on 04/16/2010   Method used:   Electronically to        [id number] (retail)       [address]       [place], [place]  [address]       Ph: [phone] or [phone]       Fax: [phone]   RxID:   [id number]    Orders Added: 1)  Prescription Created Electronically [G8553] 2)  Est. Patient Level IV [62831]

## 2010-04-30 NOTE — Assessment & Plan Note (Signed)
Summary: problems with tonsils/cjr rsc due to car trouble/njr   Vital Signs:  Patient profile:   77 year old male Height:      71 inches (180.34 cm) Weight:      168.31 pounds (76.50 kg) O2 Sat:      97 % on Room air Temp:     98.3 degrees F (36.83 degrees C) oral Pulse rate:   85 / minute BP sitting:   150 / 64  (left arm) Cuff size:   regular  Vitals Entered By: Josph Macho RMA (February 05, 2010 9:12 AM)  O2 Flow:  Room air  Serial Vital Signs/Assessments:  Time      Position  BP       Pulse  Resp  Temp     By                     138/78                         Danise Edge MD  CC: problem w/tonsils on and off (discuss with MD)/ CF Is Patient Diabetic? No   History of Present Illness: 77 yo Caucasian male in today for evaluation of an insect bite that has previously been treated with an antibiotic in the past.  He is concerned about some irritation on the tip of his tongue, this irritation is slowly improving but still present. He reports this has been present since a TIA in 1991. He first noting tingling at the tip of the tongue then and it has never fully resolved. He never has seen any white plaque or obvious sores or lesions. He notes the mouthwash he was given at his last visit was somewhat helpful. He is denying any pharyngitis, dyspepsia, reflux, CP, palp, SOB, GI or GU c/o. He is concerned about some recurrent skin lesions he believes were bug bites and he worries he could still be dealing with some residual secondary infections but he denies any f/c/malaise/anorexia/fatigue.  Current Medications (verified): 1)  Metoprolol Tartrate 25 Mg Tabs (Metoprolol Tartrate) .... Take 1 Tablet By Mouth Once A Day 2)  Nexium 40 Mg Cpdr (Esomeprazole Magnesium) .... Take 1 Capsule By Mouth Two Times A Day 3)  Nitroquick 0.4 Mg Subl (Nitroglycerin) .... Use As Directed 4)  Bayer Aspirin Ec Low Dose 81 Mg Tbec (Aspirin) .... 2 Po Two Times A Day 5)  Lipitor 40 Mg Tabs (Atorvastatin  Calcium) .... Once Daily 6)  Tricor 145 Mg Tabs (Fenofibrate) .... Daily 7)  Miralax  Powd (Polyethylene Glycol 3350) .Marland Kitchen.. 1 Measure  Each Day Am or Pm For A Regular Bm 8)  Ocuvite Preservision  Tabs (Multiple Vitamins-Minerals) 9)  Triamcinolone in Absorbase 0.05 % Oint (Triamcinolone Acetonide) .... Apply Once Daily To Affected Area 10)  Cetaphil Defense Spf 50  Crea (Sunscreens) .... Apply Daily 11)  Clarinex 5 Mg Tabs (Desloratadine) .Marland Kitchen.. 1 Qd 12)  Clobetasol Propionate 0.05 % Oint (Clobetasol Propionate) 13)  Medrol 8 Mg Tabs (Methylprednisolone) .Marland Kitchen.. 1 Two Times A Day Pc For 7 Days Then 1 Once Daily For 11 Days 14)  Hydroxyzine Hcl 25 Mg Tabs (Hydroxyzine Hcl) .Marland Kitchen.. 1 Morn Midaternoon and Hs To Prevent Rash and Itching 15)  Diflucan 100 Mg Tabs (Fluconazole) .Marland Kitchen.. 1 Tab By Mouth Q Week X 2 16)  First-Dukes Mouthwash  Susp (Diphenhyd-Hydrocort-Nystatin) .... 5 Cc By Mouth 4 X Daily As Needed Mouth Sores, As Needed Pain X 7days  Allergies (verified): 1)  ! * Pcn Derivatives 2)  ! * Int. Crestor 3)  ! * Doxycycline 4)  ! Bactrim 5)  Amoxicillin (Amoxicillin) 6)  Erythromycin (Erythromycin)  Past History:  Past medical history reviewed for relevance to current acute and chronic problems. Social history (including risk factors) reviewed for relevance to current acute and chronic problems.  Past Medical History: Reviewed history from 04/09/2009 and no changes required. CONSTIPATION (ICD-564.00) IRRITABLE BOWEL SYNDROME (ICD-564.1) HEMORRHOIDS (ICD-455.6) DIVERTICULAR DISEASE (ICD-562.10) GASTRITIS (ICD-535.50) PERSONAL HX COLONIC POLYPS (ICD-V12.72) GERD (ICD-530.81) ANXIETY DEPRESSION (ICD-300.4) ELEVATED PROSTATE SPECIFIC ANTIGEN (ICD-790.93) VITAMIN B12 DEFICIENCY (ICD-266.2) UTI (ICD-599.0) ARTHRITIS, CERVICAL SPINE (ICD-721.90) SCIATICA (ICD-724.3) ANEMIA (ICD-285.9) TRANSIENT ISCHEMIC ATTACK, HX OF (ICD-V12.50) PEPTIC ULCER DISEASE (ICD-533.90) HYPERLIPIDEMIA  (ICD-272.4) CORONARY ARTERY DISEASE (ICD-414.00) (PTCA and stenting of proximal LAD with 75%     stenosis reduced to 0%.  This was in 2006.  Stress perfusion study     in April 2009 demonstrated well-preserved ejection fraction with no     evidence of ischemia or infarct)  Social History: Reviewed history from 04/09/2009 and no changes required. Retired  Married Former Smoker-stopped 1974 Alcohol use-yes-occasional Drug use-no Daily Caffeine Use 1 cup/day Patient gets regular exercise.  Review of Systems      See HPI  Physical Exam  General:  Well-developed,well-nourished,in no acute distress; alert,appropriate and cooperative throughout examination Head:  Normocephalic and atraumatic without obvious abnormalities. No apparent alopecia or balding. Ears:  External ear exam shows no significant lesions or deformities.  Otoscopic examination reveals clear canals, tympanic membranes are intact bilaterally  Mouth:  Oral mucosa and oropharynx without lesions or exudates.  Teeth in good repair. Neck:  No deformities, masses, or tenderness noted. Lungs:  Normal respiratory effort, chest expands symmetrically. Lungs are clear to auscultation, no crackles or wheezes. Heart:  Normal rate and regular rhythm. S1 and S2 normal without gallop, murmur, click, rub or other extra sounds. Abdomen:  Bowel sounds positive,abdomen soft and non-tender without masses, organomegaly or hernias noted. Extremities:  No clubbing, cyanosis, edema, or deformity noted with normal full range of motion of all joints.   Skin:  Intact without suspicious lesions or rashes Cervical Nodes:  No lymphadenopathy noted Psych:  Cognition and judgment appear intact. Alert and cooperative with normal attention span and concentration. No apparent delusions, illusions, hallucinations   Impression & Recommendations:  Problem # 1:  CANDIDIASIS, ORAL (ICD-112.0) No obvious lesions in mouth, consider a neurologic disorder vs a  candidal flare but will not further investigate unless symptoms worsen  Problem # 2:  CELLULITIS, ARM (ICD-682.3)  Orders: Specimen Handling (16109) Venipuncture (60454) TLB-CBC Platelet - w/Differential (85025-CBCD) Resolved but patient is very anxious so will evaluate labs to provide further clinical information  Problem # 3:  ELEVATED BP READING WITHOUT DX HYPERTENSION (ICD-796.2)  His updated medication list for this problem includes:    Metoprolol Tartrate 25 Mg Tabs (Metoprolol tartrate) .Marland Kitchen... Take 1 tablet by mouth once a day Improved on repeat no changes today to meds, avoid sodium  Complete Medication List: 1)  Metoprolol Tartrate 25 Mg Tabs (Metoprolol tartrate) .... Take 1 tablet by mouth once a day 2)  Nexium 40 Mg Cpdr (Esomeprazole magnesium) .... Take 1 capsule by mouth two times a day 3)  Nitroquick 0.4 Mg Subl (Nitroglycerin) .... Use as directed 4)  Bayer Aspirin Ec Low Dose 81 Mg Tbec (Aspirin) .... 2 po two times a day 5)  Lipitor 40 Mg Tabs (Atorvastatin calcium) .... Once daily  6)  Tricor 145 Mg Tabs (Fenofibrate) .... Daily 7)  Miralax Powd (Polyethylene glycol 3350) .Marland Kitchen.. 1 measure  each day am or pm for a regular bm 8)  Ocuvite Preservision Tabs (Multiple vitamins-minerals) 9)  Triamcinolone in Absorbase 0.05 % Oint (Triamcinolone acetonide) .... Apply once daily to affected area 10)  Cetaphil Defense Spf 50 Crea (Sunscreens) .... Apply daily 11)  Clarinex 5 Mg Tabs (Desloratadine) .Marland Kitchen.. 1 qd 12)  Clobetasol Propionate 0.05 % Oint (Clobetasol propionate) 13)  Medrol 8 Mg Tabs (Methylprednisolone) .Marland Kitchen.. 1 two times a day pc for 7 days then 1 once daily for 11 days 14)  Hydroxyzine Hcl 25 Mg Tabs (Hydroxyzine hcl) .Marland Kitchen.. 1 morn midaternoon and hs to prevent rash and itching 15)  Diflucan 100 Mg Tabs (Fluconazole) .Marland Kitchen.. 1 tab by mouth q week x 2 16)  First-dukes Mouthwash Susp (Diphenhyd-hydrocort-nystatin) .... 5 cc by mouth 4 x daily as needed mouth sores, as needed  pain x 7days 17)  Align 4 Mg Caps (Probiotic product) .Marland Kitchen.. 1 cap daily x 30 days then as needed for antibiotic use 18)  Bactroban 2 % Oint (Mupirocin) .... Apply small amount via clean qtip to each nostril at bedtime x 7 days  Other Orders: TLB-BMP (Basic Metabolic Panel-BMET) (80048-METABOL) TLB-Hepatic/Liver Function Pnl (80076-HEPATIC)  Patient Instructions: 1)  Please schedule a follow-up appointment in 1 to 2 months with Dr Scotty Court 2)  Start the probiotic Align caps, 1 daily for the next month to help the mouth. 3)  Wash the skin daily with Cetaphil antibacterial soap 4)  If any lesions develop let us see them and cleanse regularly with [facility] Astringent. 5)  Bath once weekly with 1 tbls of Bleach in the bath 6)  Apply Bactroban ointment to b/l nares nightly x 7 nights Prescriptions: BACTROBAN 2 % OINT (MUPIROCIN) apply small amount via clean qtip to each nostril at bedtime x 7 days  #1 tube x 0   Entered and Authorized by:   Danise Edge MD   Signed by:   Danise Edge MD on 02/05/2010   Method used:   Historical   RxID:   [id number]    Orders Added: 1)  Specimen Handling [99000] 2)  Venipuncture [46962] 3)  TLB-CBC Platelet - w/Differential [85025-CBCD] 4)  TLB-BMP (Basic Metabolic Panel-BMET) [80048-METABOL] 5)  TLB-Hepatic/Liver Function Pnl [80076-HEPATIC] 6)  Est. Patient Level IV [95284]

## 2010-04-30 NOTE — Progress Notes (Signed)
Summary: triage   Phone Note Call from Patient Call back at Home Phone [phone]   Caller: Patient Call For: Dr. Russella Dar Reason for Call: Talk to Nurse Summary of Call: dysphagia, loss of appetite, weight loss, severe weakness and fatigue... offered next available (June) and pt says he cannot wait that long Initial call taken by: Vallarie Mare,  July 16, 2009 11:22 AM  Follow-up for Phone Call        Patient  reports that he has had worsening dysphagia , he reports that he has to swallow multiple times to get his food down. He reports daily nausea but no vomiting.  He preports an 7 lb wt loss , according to EMR wt was 176 06/27/09 last weight 169 on 07/09/09.  He reports no appetite and he forces himself to eat.  He reports at the office visit in January yoiu indicated a colon could be done if he wanted to further persue, but not needed.  Patient  is requesting a direct colon and EGD to evaluate the above.  Please advise Follow-up by: Darcey Nora RN, CGRN,  July 16, 2009 11:59 AM  Additional Follow-up for Phone Call Additional follow up Details #1::        Given his multiple complaints he needs and REV. Should see an extender if I do not have availablity. Additional Follow-up by: Meryl Dare MD Clementeen Graham,  July 16, 2009 12:12 PM    Additional Follow-up for Phone Call Additional follow up Details #2::    Patient  offered an appointment with the extender for this week, patient prefers to wait for Dr Russella Dar.  REV scheduled for 07/30/09 10:15 Follow-up by: Darcey Nora RN, CGRN,  July 16, 2009 1:38 PM

## 2010-04-30 NOTE — Consult Note (Signed)
Summary: [facility]  [facility]   Imported By: Maryln Gottron 11/26/2009 10:22:13  _____________________________________________________________________  External Attachment:    Type:   Image     Comment:   External Document

## 2010-04-30 NOTE — Assessment & Plan Note (Signed)
Summary: consult re: bacterial inf/cjr   Vital Signs:  Patient profile:   77 year old male Weight:      165 pounds BMI:     23.10 O2 Sat:      97 % Temp:     98.1 degrees F Pulse rate:   69 / minute Pulse rhythm:   regular BP sitting:   130 / 76  (left arm)  Vitals Entered By: Pura Spice, RN (November 11, 2009 9:48 AM) CC: states has "Baacterial infection all over"    History of Present Illness: This 77 year old white paranasal recently has had a cellulitis or bacterial infection with rash and was treated with Bactroban and improved. He has now had a recurrence of an erythematous rash over his entire body space on the chest and her aspects of the forearm. Lesions are fascicular but not purulent His wife blames the rash on working on the grass and contact with fertilizers, rash is very pruritic Hypertension well controlled Patient is emotionally much better not as anxious and depressed, continues to take Lexapro 20 mg q. day  Allergies: 1)  ! * Pcn Derivatives 2)  ! * Int. Crestor 3)  ! * Doxycycline 4)  ! Bactrim 5)  Amoxicillin (Amoxicillin) 6)  Erythromycin (Erythromycin)  Past History:  Past Medical History: Last updated: 04/09/2009 CONSTIPATION (ICD-564.00) IRRITABLE BOWEL SYNDROME (ICD-564.1) HEMORRHOIDS (ICD-455.6) DIVERTICULAR DISEASE (ICD-562.10) GASTRITIS (ICD-535.50) PERSONAL HX COLONIC POLYPS (ICD-V12.72) GERD (ICD-530.81) ANXIETY DEPRESSION (ICD-300.4) ELEVATED PROSTATE SPECIFIC ANTIGEN (ICD-790.93) VITAMIN B12 DEFICIENCY (ICD-266.2) UTI (ICD-599.0) ARTHRITIS, CERVICAL SPINE (ICD-721.90) SCIATICA (ICD-724.3) ANEMIA (ICD-285.9) TRANSIENT ISCHEMIC ATTACK, HX OF (ICD-V12.50) PEPTIC ULCER DISEASE (ICD-533.90) HYPERLIPIDEMIA (ICD-272.4) CORONARY ARTERY DISEASE (ICD-414.00) (PTCA and stenting of proximal LAD with 75%     stenosis reduced to 0%.  This was in 2006.  Stress perfusion study     in April 2009 demonstrated well-preserved ejection fraction with  no     evidence of ischemia or infarct)  Past Surgical History: Last updated: 04/09/2009 Cataract extraction-left eye Cholecystectomy Hernia Surgery Hip surgery after fracture 1/09-right Rt. sub. mand. gland extraction 9/09 Stent-05/06/2004 EGD-07/26/2006 Colonoscopy-07/07/2006  Social History: Last updated: 04/09/2009 Retired  Married Former Smoker-stopped 1974 Alcohol use-yes-occasional Drug use-no Daily Caffeine Use 1 cup/day Patient gets regular exercise.  Risk Factors: Smoking Status: quit (10/19/2006)  Review of Systems      See HPI  The patient denies anorexia, fever, weight loss, weight gain, vision loss, decreased hearing, hoarseness, chest pain, syncope, dyspnea on exertion, peripheral edema, prolonged cough, headaches, hemoptysis, abdominal pain, melena, hematochezia, severe indigestion/heartburn, hematuria, incontinence, genital sores, muscle weakness, suspicious skin lesions, transient blindness, difficulty walking, depression, unusual weight change, abnormal bleeding, enlarged lymph nodes, angioedema, breast masses, and testicular masses.    Physical Exam  General:  Well-developed,well-nourished,in no acute distress; alert,appropriate and cooperative throughout examination Lungs:  Normal respiratory effort, chest expands symmetrically. Lungs are clear to auscultation, no crackles or wheezes. Heart:  Normal rate and regular rhythm. S1 and S2 normal without gallop, murmur, click, rub or other extra sounds. Skin:  erythematous rash distributed over the bodies as well as anterior chest and inner aspects of the forearm some lesions or vesicular others or not   Impression & Recommendations:  Problem # 1:  DERMATITIS, ATOPIC (ICD-691.8) Assessment New  His updated medication list for this problem includes:    Triamcinolone in Absorbase 0.05 % Oint (Triamcinolone acetonide) .Marland Kitchen... Apply once daily to affected area    Clobetasol Propionate 0.05 % Oint (Clobetasol  propionate)  Orders: Prescription Created Electronically [phone])  Problem # 2:  INSECT BITE (ICD-919.4) Assessment: Improved  Problem # 3:  DYSPHAGIA UNSPECIFIED (ICD-787.20) Assessment: Improved  Problem # 4:  MACULAR DEGENERATION (ICD-362.50) Assessment: Improved  Problem # 5:  GERD (ICD-530.81) Assessment: Improved  His updated medication list for this problem includes:    Nexium 40 Mg Cpdr (Esomeprazole magnesium) .Marland Kitchen... Take 1 capsule by mouth two times a day  Problem # 6:  ANXIETY DEPRESSION (ICD-300.4) Assessment: Improved Lexapro 20 mg q.d.  Complete Medication List: 1)  Metoprolol Tartrate 25 Mg Tabs (Metoprolol tartrate) .... Take 1 tablet by mouth once a day 2)  Nexium 40 Mg Cpdr (Esomeprazole magnesium) .... Take 1 capsule by mouth two times a day 3)  Nitroquick 0.4 Mg Subl (Nitroglycerin) .... Use as directed 4)  Bayer Aspirin Ec Low Dose 81 Mg Tbec (Aspirin) .... 2 po two times a day 5)  Lipitor 40 Mg Tabs (Atorvastatin calcium) .... Once daily 6)  Tricor 145 Mg Tabs (Fenofibrate) .... Daily 7)  Miralax Powd (Polyethylene glycol 3350) .Marland Kitchen.. 1 measure  each day am or pm for a regular bm 8)  Ocuvite Preservision Tabs (Multiple vitamins-minerals) 9)  Lexapro 10 Mg Tabs (Escitalopram oxalate) .Marland Kitchen.. 1 once daily for anxiety and depression 10)  Triamcinolone in Absorbase 0.05 % Oint (Triamcinolone acetonide) .... Apply once daily to affected area 11)  Cetaphil Defense Spf 50 Crea (Sunscreens) .... Apply daily 12)  Lexapro 20 Mg Tabs (Escitalopram oxalate) .Marland Kitchen.. 1 once daily for anxiety depression 13)  Clarinex 5 Mg Tabs (Desloratadine) .Marland Kitchen.. 1 qd 14)  Clobetasol Propionate 0.05 % Oint (Clobetasol propionate) 15)  Medrol 8 Mg Tabs (Methylprednisolone) .Marland Kitchen.. 1 two times a day pc for 7 days then 1 once daily for 11 days 16)  Hydroxyzine Hcl 25 Mg Tabs (Hydroxyzine hcl) .Marland Kitchen.. 1 morn midaternoon and hs to prevent rash and itching  Patient Instructions: 1)  in general a few you  do and much better her likely to continue the regular medications that you're taking at the present time 2)  For the atopic dermatitis and pruritus or itching take medications as prescribed Medrol, hydroxyzine and steroid cream 3)  Return or follow better Prescriptions: HYDROXYZINE HCL 25 MG TABS (HYDROXYZINE HCL) 1 morn midaternoon and hs to prevent rash and itching  #60 x 5   Entered and Authorized by:   Judithann Sheen MD   Signed by:   Judithann Sheen MD on 11/11/2009   Method used:   Electronically to        [id number] (retail)       [address]       [place], [place]  [address]       Ph: [phone] or [phone]       Fax: [phone]   RxID:   [id number] MEDROL 8 MG TABS (METHYLPREDNISOLONE) 1 two times a day pc for 7 days then 1 once daily for 11 days  #18 x 3   Entered and Authorized by:   Judithann Sheen MD   Signed by:   Judithann Sheen MD on 11/11/2009   Method used:   Electronically to        [id number] (retail)       [address]       [place], [place]  [address]       Ph: [phone] or [phone]       Fax: [phone]   RxID:  [id number]  

## 2010-04-30 NOTE — Progress Notes (Signed)
Summary: Nexium  Medications Added NEXIUM 40 MG CPDR (ESOMEPRAZOLE MAGNESIUM) Take 1 capsule by mouth two times a day       Phone Note Call from Patient   Caller: Patient Call For: Dr. Russella Dar Reason for Call: Refill Medication Summary of Call: needs rx for Nexium renewed... [facility] [address]... would like 90 day supply Initial call taken by: Vallarie Mare,  July 03, 2009 9:51 AM  Follow-up for Phone Call        Rx was sent to pts pharmacy.  Follow-up by: Christie Nottingham CMA (AAMA),  July 03, 2009 10:08 AM    New/Updated Medications: NEXIUM 40 MG CPDR (ESOMEPRAZOLE MAGNESIUM) Take 1 capsule by mouth two times a day Prescriptions: NEXIUM 40 MG CPDR (ESOMEPRAZOLE MAGNESIUM) Take 1 capsule by mouth two times a day  #180 x 3   Entered by:   Christie Nottingham CMA (AAMA)   Authorized by:   Meryl Dare MD [facility]   Signed by:   Christie Nottingham CMA (AAMA) on 07/03/2009   Method used:   Electronically to        [id number] (retail)       [address]       [place], [place]  [address]       Ph: [phone] or [phone]       Fax: [phone]   RxID:   [id number]

## 2010-04-30 NOTE — Assessment & Plan Note (Signed)
Summary: f/u myoview 06-19-09/sl  Medications Added DOXYCYCLINE HYCLATE 50 MG CAPS (DOXYCYCLINE HYCLATE) 1 by mouth two times a day      Allergies Added:   Visit Type:  Follow-up Referring Provider:  n/a Primary Provider:  Rickard Patience, MD  CC:  chest pain.  History of Present Illness: The patient presents for followup of chest discomfort. He had a stress perfusion study last week which demonstrated a well preserved ejection fraction and no evidence of ischemia. This was ordered because of chest tightness that he gets occasionally with walking or when he is upset and tense. This will go away spontaneously. It's not always reproducible with activity. He can go for walks on some days without shortness of breath or chest discomfort. Again the stress test was normal. On further conversation he is more irritable and anxious than he has been. He is not having any new GI complaints and recently saw his gastroenterologist. He's not had any new shortness of breath, PND or orthopnea.  Current Medications (verified): 1)  Metoprolol Tartrate 25 Mg Tabs (Metoprolol Tartrate) .... Take 1 Tablet By Mouth Once A Day 2)  Nexium 40 Mg Cpdr (Esomeprazole Magnesium) .... Take 1 Capsule By Mouth Two Times A Day 3)  Nitroquick 0.4 Mg Subl (Nitroglycerin) .... Use As Directed 4)  Bayer Aspirin Ec Low Dose 81 Mg Tbec (Aspirin) .... 2 Po Two Times A Day 5)  Lipitor 40 Mg Tabs (Atorvastatin Calcium) .... Once Daily 6)  Tricor 145 Mg Tabs (Fenofibrate) .... Daily 7)  Mucinex Dm Maximum Strength 60-1200 Mg Xr12h-Tab (Dextromethorphan-Guaifenesin) .... As Needed 8)  Bepreve 1.5 % Soln (Bepotastine Besilate) .... One Drop Each Eye Two Times A Day 9)  Macula Protect .Marland Kitchen.. 4 By Mouth Daiy 10)  Miralax  Powd (Polyethylene Glycol 3350) .Marland Kitchen.. 1 Measure  Each Day Am or Pm For A Regular Bm 11)  Doxycycline Hyclate 50 Mg Caps (Doxycycline Hyclate) .Marland Kitchen.. 1 By Mouth Two Times A Day  Allergies (verified): 1)  ! * Pcn  Derivatives 2)  ! * Int. Crestor 3)  Amoxicillin (Amoxicillin) 4)  Erythromycin (Erythromycin)  Past History:  Past Medical History: Reviewed history from 04/09/2009 and no changes required. CONSTIPATION (ICD-564.00) IRRITABLE BOWEL SYNDROME (ICD-564.1) HEMORRHOIDS (ICD-455.6) DIVERTICULAR DISEASE (ICD-562.10) GASTRITIS (ICD-535.50) PERSONAL HX COLONIC POLYPS (ICD-V12.72) GERD (ICD-530.81) ANXIETY DEPRESSION (ICD-300.4) ELEVATED PROSTATE SPECIFIC ANTIGEN (ICD-790.93) VITAMIN B12 DEFICIENCY (ICD-266.2) UTI (ICD-599.0) ARTHRITIS, CERVICAL SPINE (ICD-721.90) SCIATICA (ICD-724.3) ANEMIA (ICD-285.9) TRANSIENT ISCHEMIC ATTACK, HX OF (ICD-V12.50) PEPTIC ULCER DISEASE (ICD-533.90) HYPERLIPIDEMIA (ICD-272.4) CORONARY ARTERY DISEASE (ICD-414.00) (PTCA and stenting of proximal LAD with 75%     stenosis reduced to 0%.  This was in 2006.  Stress perfusion study     in April 2009 demonstrated well-preserved ejection fraction with no     evidence of ischemia or infarct)  Past Surgical History: Reviewed history from 04/09/2009 and no changes required. Cataract extraction-left eye Cholecystectomy Hernia Surgery Hip surgery after fracture 1/09-right Rt. sub. mand. gland extraction 9/09 Stent-05/06/2004 EGD-07/26/2006 Colonoscopy-07/07/2006  Review of Systems       As stated in the HPI and negative for all other systems.   Vital Signs:  Patient profile:   77 year old male Height:      71 inches Weight:      176 pounds Pulse rate:   78 / minute Resp:     16 per minute BP sitting:   132 / 78  (right arm)  Vitals Entered By: Marrion Coy, CNA (June 27, 2009 9:15  AM)  Physical Exam  General:  Well developed, well nourished, in no acute distress. Head:  normocephalic and atraumatic Eyes:  PERRLA/EOM intact; conjunctiva and lids normal. Nose:  External nasal examination shows no deformity or inflammation. Nasal mucosa are pink and moist without lesions or exudates. Mouth:   dentures.  Oral mucosa normal. Neck:  Neck supple, no JVD. No masses, thyromegaly or abnormal cervical nodes. Chest Wall:  no deformities or breast masses noted Lungs:  Clear bilaterally to auscultation and percussion. Heart:  Non-displaced PMI, chest non-tender; regular rate and rhythm, S1, S2 without murmurs, rubs or gallops. Carotid upstroke normal, no bruit. Normal abdominal aortic size, no bruits. Femorals normal pulses, no bruits. Pedals normal pulses. No edema, no varicosities. Abdomen:  Bowel sounds positive; abdomen soft and non-tender without masses, organomegaly, or hernias noted. No hepatosplenomegaly. Msk:  Back normal, normal gait. Muscle strength and tone normal. Extremities:  No clubbing or cyanosis. Neurologic:  Alert and oriented x 3. Skin:  Intact without lesions or rashes. Psych:  Normal affect.   Impression & Recommendations:  Problem # 1:  ANXIETY DEPRESSION (ICD-300.4) I feel that this is strongly tied to his current symptoms. I have discussed this with him and he would be receptive to treatment. I will send this note to Dr. Scotty Court to see if he will consider addressing this.  Problem # 2:  CORONARY ARTERY DISEASE (ICD-414.00) His perfusion study was within normal limits. I did not suggest a cardiac etiology or further testing at this point. He needs continued risk reduction.  Problem # 3:  HYPERLIPIDEMIA (ICD-272.4) Per Dr. Scotty Court with a goal LDL less than 100 and HDL greater than 40.  Patient Instructions: 1)  Your physician recommends that you schedule a follow-up appointment in: 12 MONTHS WITH DR [facility] 2)  Your physician recommends that you continue on your current medications as directed. Please refer to the Current Medication list given to you today.

## 2010-04-30 NOTE — Progress Notes (Signed)
Summary: Triage   Phone Note Call from Patient Call back at Home Phone [phone]   Caller: Patient Call For: Dr.stark Reason for Call: Talk to Nurse Summary of Call: Pt is coughing alot with mucus coming up, wants to speak with nurse  about who he needs to see for this primary care/ENT/GI Initial call taken by: [place] Johnson,  March 16, 2010 10:31 AM  Follow-up for Phone Call        productive cough.  He reports coughing and having mucus comimg up.  He is advised should see his primary care MD. Follow-up by: Darcey Nora RN, CGRN,  March 16, 2010 12:09 PM

## 2010-04-30 NOTE — Assessment & Plan Note (Signed)
Summary: rash/dm   Vital Signs:  Patient profile:   77 year old male Weight:      165 pounds BMI:     23.10 O2 Sat:      92 % Temp:     98.3 degrees F Pulse rate:   68 / minute Pulse rhythm:   regular BP sitting:   110 / 72  (left arm)  Vitals Entered By: Pura Spice, RN (Aug 21, 2009 11:40 AM) CC: ck bumps on rt arm denies itching or burning or pain from area.    History of Present Illness: This 77 year old white married male he is in today complaining of leg erythematous tender or painful it she wants centimeter lesion under her right upper arm which he is able to palpate. This occurred 3-4 days ago and hasn't decreased in size and symptoms and he is in for treatment Also relates he continues to have some costochondral pain but not as severe as previously Anxiety and depression have not Improved sufficiently and discussed increasing Lexapro 1-1/2 tablet per day He has coronary artery disease and is under the care cardiologist as well as persistent and recurrent GERD which haven't relates he has a difficult time distinguishing between the 2 Continues to lose weight but we have been unable to determine the reason listed is due to anxiety stress As had studies done to determine the cause and all were negative at this time           Allergies: 1)  ! * Pcn Derivatives 2)  ! * Int. Crestor 3)  ! * Doxycycline 4)  Amoxicillin (Amoxicillin) 5)  Erythromycin (Erythromycin)  Past History:  Past Medical History: Last updated: 04/09/2009 CONSTIPATION (ICD-564.00) IRRITABLE BOWEL SYNDROME (ICD-564.1) HEMORRHOIDS (ICD-455.6) DIVERTICULAR DISEASE (ICD-562.10) GASTRITIS (ICD-535.50) PERSONAL HX COLONIC POLYPS (ICD-V12.72) GERD (ICD-530.81) ANXIETY DEPRESSION (ICD-300.4) ELEVATED PROSTATE SPECIFIC ANTIGEN (ICD-790.93) VITAMIN B12 DEFICIENCY (ICD-266.2) UTI (ICD-599.0) ARTHRITIS, CERVICAL SPINE (ICD-721.90) SCIATICA (ICD-724.3) ANEMIA (ICD-285.9) TRANSIENT ISCHEMIC  ATTACK, HX OF (ICD-V12.50) PEPTIC ULCER DISEASE (ICD-533.90) HYPERLIPIDEMIA (ICD-272.4) CORONARY ARTERY DISEASE (ICD-414.00) (PTCA and stenting of proximal LAD with 75%     stenosis reduced to 0%.  This was in 2006.  Stress perfusion study     in April 2009 demonstrated well-preserved ejection fraction with no     evidence of ischemia or infarct)  Past Surgical History: Last updated: 04/09/2009 Cataract extraction-left eye Cholecystectomy Hernia Surgery Hip surgery after fracture 1/09-right Rt. sub. mand. gland extraction 9/09 Stent-05/06/2004 EGD-07/26/2006 Colonoscopy-07/07/2006  Social History: Last updated: 04/09/2009 Retired  Married Former Smoker-stopped 1974 Alcohol use-yes-occasional Drug use-no Daily Caffeine Use 1 cup/day Patient gets regular exercise.  Risk Factors: Smoking Status: quit (10/19/2006)  Review of Systems      See HPI  The patient denies anorexia, fever, weight loss, weight gain, vision loss, decreased hearing, hoarseness, chest pain, syncope, dyspnea on exertion, peripheral edema, prolonged cough, headaches, hemoptysis, abdominal pain, melena, hematochezia, severe indigestion/heartburn, hematuria, incontinence, genital sores, muscle weakness, suspicious skin lesions, transient blindness, difficulty walking, depression, unusual weight change, abnormal bleeding, enlarged lymph nodes, angioedema, breast masses, and testicular masses.    Physical Exam  General:  Well-developed,well-nourished,in no acute distress; alert,appropriate and cooperative throughout examinationunderweight appearing.   Chest Wall:  minimal costochondral tenderness to through 5 Lungs:  Normal respiratory effort, chest expands symmetrically. Lungs are clear to auscultation, no crackles or wheezes. Heart:  Normal rate and regular rhythm. S1 and S2 normal without gallop, murmur, click, rub or other extra sounds. Abdomen:  Bowel sounds positive,abdomen  soft and non-tender without masses,  organomegaly or hernias noted. Skin:  There is a 1 cm elevated bump on posterior aspect of the right upper arm near erythematous and tender Axillary Nodes:  No palpable lymphadenopathy   Impression & Recommendations:  Problem # 1:  INSECT BITE (ICD-919.4) Assessment New clarinex  5mg  qd  Problem # 2:  CELLULITIS, ARM (ICD-682.3) Assessment: New  The following medications were removed from the medication list:    Doxycycline Hyclate 100 Mg Caps (Doxycycline hyclate) .Marland Kitchen... 1 two times a day until finished for cellulitis from insect bite His updated medication list for this problem includes:    Bactrim Ds 800-160 Mg Tabs (Sulfamethoxazole-trimethoprim)  Orders: Prescription Created Electronically [phone])  Problem # 3:  COSTOCHONDRITIS, LEFT (ICD-733.6) Assessment: New  His updated medication list for this problem includes:    Vitamin D (ergocalciferol) 50000 Unit Caps (Ergocalciferol) .Marland Kitchen... 1 weekly for 12 weeks mild, no other tx at this time  Problem # 4:  ANXIETY DEPRESSION (ICD-300.4) Assessment: Improved  Problem # 5:  GERD (ICD-530.81) Assessment: Improved  His updated medication list for this problem includes:    Nexium 40 Mg Cpdr (Esomeprazole magnesium) .Marland Kitchen... Take 1 capsule by mouth two times a day  Problem # 6:  CORONARY ARTERY DISEASE (ICD-414.00) Assessment: Unchanged  His updated medication list for this problem includes:    Metoprolol Tartrate 25 Mg Tabs (Metoprolol tartrate) .Marland Kitchen... Take 1 tablet by mouth once a day    Nitroquick 0.4 Mg Subl (Nitroglycerin) ..... Use as directed    Bayer Aspirin Ec Low Dose 81 Mg Tbec (Aspirin) .Marland Kitchen... 2 po two times a day  Complete Medication List: 1)  Metoprolol Tartrate 25 Mg Tabs (Metoprolol tartrate) .... Take 1 tablet by mouth once a day 2)  Nexium 40 Mg Cpdr (Esomeprazole magnesium) .... Take 1 capsule by mouth two times a day 3)  Nitroquick 0.4 Mg Subl (Nitroglycerin) .... Use as directed 4)  Bayer Aspirin Ec Low Dose  81 Mg Tbec (Aspirin) .... 2 po two times a day 5)  Lipitor 40 Mg Tabs (Atorvastatin calcium) .... Once daily 6)  Tricor 145 Mg Tabs (Fenofibrate) .... Daily 7)  Miralax Powd (Polyethylene glycol 3350) .Marland Kitchen.. 1 measure  each day am or pm for a regular bm 8)  Ocuvite Preservision Tabs (Multiple vitamins-minerals) 9)  Lexapro 10 Mg Tabs (Escitalopram oxalate) .Marland Kitchen.. 1 once daily for anxiety and depression 10)  Vitamin D (ergocalciferol) 50000 Unit Caps (Ergocalciferol) .Marland KitchenMarland KitchenMarland Kitchen 1 weekly for 12 weeks 11)  Triamcinolone in Absorbase 0.05 % Oint (Triamcinolone acetonide) .... Apply once daily to affected area 12)  Cetaphil Defense Spf 50 Crea (Sunscreens) .... Apply daily 13)  Lexapro 20 Mg Tabs (Escitalopram oxalate) .Marland Kitchen.. 1 once daily for anxiety depression 14)  Clarinex 5 Mg Tabs (Desloratadine) .Marland Kitchen.. 1 qd 15)  Bactrim Ds 800-160 Mg Tabs (Sulfamethoxazole-trimethoprim)  Patient Instructions: 1)  insect  bite with underlying cellulitis 2)  clarinex daily for 10 days 3)  Doxycycline 100mg  two times a day 4)  for 10 days 5)  take 1/2 tab Lexapro for 1 week then resume with whole tab 6)  costochondritis, mild no new tratment aat this time, idf gets more severe then call Prescriptions: DOXYCYCLINE HYCLATE 100 MG CAPS (DOXYCYCLINE HYCLATE) 1 two times a day until finished for cellulitis from insect bite  #20 x 0   Entered and Authorized by:   Judithann Sheen MD   Signed by:   Judithann Sheen MD on  08/21/2009   Method used:   Electronically to        [id number] (retail)       [address]       [place], [place]  [address]       Ph: [phone] or [phone]       Fax: [phone]   RxID:   [id number]

## 2010-04-30 NOTE — Assessment & Plan Note (Signed)
Summary: dysphagia, wt loss,/sheri   History of Present Illness Visit Type: Follow-up Visit Primary GI MD: Elie Goody MD [facility] Primary Provider: Rickard Patience, MD Requesting Provider: n/a Chief Complaint: occasional solid and liquid dysphagia, 10 pound weight loss since December when he was taking Doxycycline-pt is unsure if the two are related History of Present Illness:   [name] returns today with his wife, complaining of intermittent difficulty swallowing, solids and liquids, and a gradual 10 pound weight loss since December. He feels his swallowing symptoms may have worsened after taking a course of doxycycline in March. She relates a poor appetite no taste for food. He was recently started on Lexapro by Dr. Scotty Court for anxiety and depression. He had a recent extensive cardiac evaluation by Dr. Antoine Poche and anxiety was felt to be the likley cause of his chest pain.   GI Review of Systems    Reports chest pain, dysphagia with liquids, dysphagia with solids, loss of appetite, nausea, and  weight loss.   Weight loss of 10 pounds over 4 months.   Denies abdominal pain, acid reflux, belching, bloating, heartburn, vomiting, vomiting blood, and  weight gain.        Denies anal fissure, black tarry stools, change in bowel habit, constipation, diarrhea, diverticulosis, fecal incontinence, heme positive stool, hemorrhoids, irritable bowel syndrome, jaundice, light color stool, liver problems, rectal bleeding, and  rectal pain.   Current Medications (verified): 1)  Metoprolol Tartrate 25 Mg Tabs (Metoprolol Tartrate) .... Take 1 Tablet By Mouth Once A Day 2)  Nexium 40 Mg Cpdr (Esomeprazole Magnesium) .... Take 1 Capsule By Mouth Two Times A Day 3)  Nitroquick 0.4 Mg Subl (Nitroglycerin) .... Use As Directed 4)  Bayer Aspirin Ec Low Dose 81 Mg Tbec (Aspirin) .... 2 Po Two Times A Day 5)  Lipitor 40 Mg Tabs (Atorvastatin Calcium) .... Once Daily 6)  Tricor 145 Mg Tabs (Fenofibrate)  .... Daily 7)  Miralax  Powd (Polyethylene Glycol 3350) .Marland Kitchen.. 1 Measure  Each Day Am or Pm For A Regular Bm 8)  Ocuvite Preservision  Tabs (Multiple Vitamins-Minerals) 9)  Lexapro 10 Mg Tabs (Escitalopram Oxalate) .Marland Kitchen.. 1 Once Daily For Anxiety and Depression 10)  Vitamin D (Ergocalciferol) 50000 Unit Caps (Ergocalciferol) .Marland Kitchen.. 1 Weekly For 12 Weeks 11)  Triamcinolone in Absorbase 0.05 % Oint (Triamcinolone Acetonide) .... Apply Once Daily To Affected Area 12)  Cetaphil Defense Spf 50  Crea (Sunscreens) .... Apply Daily  Allergies (verified): 1)  ! * Pcn Derivatives 2)  ! * Int. Crestor 3)  Amoxicillin (Amoxicillin) 4)  Erythromycin (Erythromycin)  Past History:  Past Medical History: Reviewed history from 04/09/2009 and no changes required. CONSTIPATION (ICD-564.00) IRRITABLE BOWEL SYNDROME (ICD-564.1) HEMORRHOIDS (ICD-455.6) DIVERTICULAR DISEASE (ICD-562.10) GASTRITIS (ICD-535.50) PERSONAL HX COLONIC POLYPS (ICD-V12.72) GERD (ICD-530.81) ANXIETY DEPRESSION (ICD-300.4) ELEVATED PROSTATE SPECIFIC ANTIGEN (ICD-790.93) VITAMIN B12 DEFICIENCY (ICD-266.2) UTI (ICD-599.0) ARTHRITIS, CERVICAL SPINE (ICD-721.90) SCIATICA (ICD-724.3) ANEMIA (ICD-285.9) TRANSIENT ISCHEMIC ATTACK, HX OF (ICD-V12.50) PEPTIC ULCER DISEASE (ICD-533.90) HYPERLIPIDEMIA (ICD-272.4) CORONARY ARTERY DISEASE (ICD-414.00) (PTCA and stenting of proximal LAD with 75%     stenosis reduced to 0%.  This was in 2006.  Stress perfusion study     in April 2009 demonstrated well-preserved ejection fraction with no     evidence of ischemia or infarct)  Past Surgical History: Reviewed history from 04/09/2009 and no changes required. Cataract extraction-left eye Cholecystectomy Hernia Surgery Hip surgery after fracture 1/09-right Rt. sub. mand. gland extraction 9/09 Stent-05/06/2004 EGD-07/26/2006 Colonoscopy-07/07/2006  Family History: Reviewed history  from 04/09/2009 and no changes required. Family History  Hypertension Family History of Cardiovascular disorder Family History of Colon Cancer: Mat. Uncle ?75 yo Family History of Coronary Artery Disease: Father  Social History: Reviewed history from 04/09/2009 and no changes required. Retired  Married Former Smoker-stopped 1974 Alcohol use-yes-occasional Drug use-no Daily Caffeine Use 1 cup/day Patient gets regular exercise.  Review of Systems       The patient complains of voice change.         The pertinent positives and negatives are noted as above and in the HPI. All other ROS were reviewed and were negative.   Vital Signs:  Patient profile:   77 year old male Height:      71 inches Weight:      167 pounds BMI:     23.38 Pulse rate:   68 / minute Pulse rhythm:   regular BP sitting:   128 / 58  (left arm) Cuff size:   regular  Vitals Entered By: Francee Piccolo CMA Duncan Dull) (Jul 30, 2009 10:45 AM)  Physical Exam  General:  Well developed, well nourished, no acute distress. Head:  Normocephalic and atraumatic. Eyes:  PERRLA, no icterus. Mouth:  No deformity or lesions, dentition normal. Lungs:  Clear throughout to auscultation. Heart:  Regular rate and rhythm; no murmurs, rubs,  or bruits. Abdomen:  Soft, nontender and nondistended. No masses, hepatosplenomegaly or hernias noted. Normal bowel sounds. Psych:  depressed affect.    Impression & Recommendations:  Problem # 1:  OTHER DYSPHAGIA (ICD-787.29) Intermittent solid and liquid dysphagia, associated with a slow steady weight loss, anorexia, and no taste for food. He recently had cardiac evaluation for chest pain which was negative. He remains on Nexium twice daily for GERD. EGD in April 2008 showed only mild gastritis. He underwent empiric dilation for dysphagia without a stricture. The risks, benefits and alternatives to endoscopy with possible biopsy and possible dilation were discussed with the patient and they consent to proceed. The procedure will be scheduled  electively. Orders: EGD (EGD)  Problem # 2:  ANXIETY DEPRESSION (ICD-300.4) I am concerned that the majority of his gastrointestinal complaints are related to anxiety and depression and he is advised to follow up with Dr. Scotty Court.  Problem # 3:  CONSTIPATION (ICD-564.00) Problem stable on MiraLax.  Problem # 4:  WEIGHT LOSS-ABNORMAL (ICD-783.21) A slow, steady, mild weight loss, likely related to her decreased intake related to appetite changes. See problem #2.  Patient Instructions: 1)  Upper Endoscopy brochure given.  2)  Please continue current medications.  3)  Copy sent to : Rickard Patience, MD 4)  The medication list was reviewed and reconciled.  All changed / newly prescribed medications were explained.  A complete medication list was provided to the patient / caregiver.  Appended Document: dysphagia, wt loss,/sheri reviewed

## 2010-04-30 NOTE — Letter (Signed)
Summary: Patient [facility] Biopsy Results  [facility]  [address] [place], [place] [address]   Phone: [phone]  Fax: [phone]        Aug 10, 2009 MRN: [id number]    [facility] [address] [place], [place]  [address]    Dear Mr. Czaja,  I am pleased to inform you that the biopsies taken during your recent endoscopic examination did not show any evidence of cancer upon pathologic examination. The biopsies showed minimal chronic inflammation and intestinal metaplasia.  Continue with the treatment plan as outlined on the day of your      exam.  Please call us if you are having persistent problems or have questions about your condition that have not been fully answered at this time.  Sincerely,  Meryl Dare MD [facility]  This letter has been electronically signed by your physician.  Appended Document: Patient Notice-Endo Biopsy Results reviewed

## 2010-04-30 NOTE — Progress Notes (Signed)
  Phone Note Call from Patient   Caller: Patient Call For: Judithann Sheen MD Summary of Call: Medications seems to be helping, but wants to know if he should refill the dose pack of medrol.  better but still coughing some. [phone] Initial call taken by: Lynann Beaver CMA AAMA,  April 22, 2010 1:14 PM  Follow-up for Phone Call        will call and refill medrol Follow-up by: Judithann Sheen MD,  April 25, 2010 3:53 PM

## 2010-04-30 NOTE — Progress Notes (Signed)
Summary: Shari Prows  Phone Note Call from Patient   Caller: Wallene Huh, wife Complaint: Urinary/GYN Problems Summary of Call: She thinks he needs to go to psychiatrist.  Micah Flesher to heart specialist as Dr. Scotty Court said.  He said the stents are fine.  He is losing weight, won't eat.  She thinks it's all the meds causing this.  Also it's always something.  Yesterday he said his thumb was burning.  Please call her back.   Initial call taken by: Rudy Jew, RN,  Jul 29, 2009 11:48 AM  Follow-up for Phone Call        wife called and spoken to about issues with her vit d --stated she missed dose last week -reassurance given to her to cont. this week . Her biggest complaint/concern is her husb. She thinks takes to much medicine and stomach issures . Pt will be seeing Dr Russella Dar tomorrow and assured her those stomach issues will be addressed at office visit. She thinks he needs referral to psychiatric eval. Appt  given for him next wednes Aug 06 2009. wife aware. Follow-up by: Pura Spice, RN,  Jul 29, 2009 1:00 PM     Appended Document: Ina Wife did not want Malic to hear that she was calling Dr. Scotty Court & telling him that she thinks he needs psychiatrist appointment.

## 2010-04-30 NOTE — Assessment & Plan Note (Signed)
Summary: cough/congestion/njr   Vital Signs:  Patient profile:   77 year old male Weight:      175 pounds Temp:     98.1 degrees F oral BP sitting:   110 / 70  (right arm) Cuff size:   regular  Vitals Entered By: Duard Brady LPN (March 16, 2010 3:34 PM) CC: c/o head congestion, productive cough, 'throat congestion" Is Patient Diabetic? No   Primary Care Alora Gorey:  Rickard Patience, MD  CC:  c/o head congestion, productive cough, and 'throat congestion".  History of Present Illness: 17 -year-old patient who is seen today with a one week history of head and chest congestion, mild sore throat, and productive cough.  There's been no documented fever.  Paroxysmal of cough and have aggravated his heartburn, slightly area and he has treated gastroesophageal reflux disease, hypertension, and coronary artery disease, medical regimen includes b.i.d., Nexium, denies any chest pain, wheezing, or shortness of breath  Allergies: 1)  ! * Pcn Derivatives 2)  ! * Int. Crestor 3)  ! * Doxycycline 4)  ! Bactrim 5)  Amoxicillin (Amoxicillin) 6)  Erythromycin (Erythromycin)  Past History:  Past Medical History: Reviewed history from 04/09/2009 and no changes required. CONSTIPATION (ICD-564.00) IRRITABLE BOWEL SYNDROME (ICD-564.1) HEMORRHOIDS (ICD-455.6) DIVERTICULAR DISEASE (ICD-562.10) GASTRITIS (ICD-535.50) PERSONAL HX COLONIC POLYPS (ICD-V12.72) GERD (ICD-530.81) ANXIETY DEPRESSION (ICD-300.4) ELEVATED PROSTATE SPECIFIC ANTIGEN (ICD-790.93) VITAMIN B12 DEFICIENCY (ICD-266.2) UTI (ICD-599.0) ARTHRITIS, CERVICAL SPINE (ICD-721.90) SCIATICA (ICD-724.3) ANEMIA (ICD-285.9) TRANSIENT ISCHEMIC ATTACK, HX OF (ICD-V12.50) PEPTIC ULCER DISEASE (ICD-533.90) HYPERLIPIDEMIA (ICD-272.4) CORONARY ARTERY DISEASE (ICD-414.00) (PTCA and stenting of proximal LAD with 75%     stenosis reduced to 0%.  This was in 2006.  Stress perfusion study     in April 2009 demonstrated well-preserved  ejection fraction with no     evidence of ischemia or infarct)  Past Surgical History: Reviewed history from 04/09/2009 and no changes required. Cataract extraction-left eye Cholecystectomy Hernia Surgery Hip surgery after fracture 1/09-right Rt. sub. mand. gland extraction 9/09 Stent-05/06/2004 EGD-07/26/2006 Colonoscopy-07/07/2006  Review of Systems       The patient complains of anorexia, hoarseness, prolonged cough, and severe indigestion/heartburn.  The patient denies fever, weight loss, weight gain, vision loss, decreased hearing, chest pain, syncope, dyspnea on exertion, peripheral edema, headaches, hemoptysis, abdominal pain, melena, hematochezia, hematuria, incontinence, genital sores, muscle weakness, suspicious skin lesions, transient blindness, difficulty walking, depression, unusual weight change, abnormal bleeding, enlarged lymph nodes, angioedema, breast masses, and testicular masses.    Physical Exam  General:  Well-developed,well-nourished,in no acute distress; alert,appropriate and cooperative throughout examination Head:  Normocephalic and atraumatic without obvious abnormalities. No apparent alopecia or balding. Eyes:  No corneal or conjunctival inflammation noted. EOMI. Perrla. Funduscopic exam benign, without hemorrhages, exudates or papilledema. Vision grossly normal. Ears:  External ear exam shows no significant lesions or deformities.  Otoscopic examination reveals clear canals, tympanic membranes are intact bilaterally without bulging, retraction, inflammation or discharge. Hearing is grossly normal bilaterally. Mouth:  Oral mucosa and oropharynx without lesions or exudates.  Teeth in good repair. Neck:  No deformities, masses, or tenderness noted. Lungs:  Normal respiratory effort, chest expands symmetrically. Lungs are clear to auscultation, no crackles or wheezes. Heart:  Normal rate and regular rhythm. S1 and S2 normal without gallop, murmur, click, rub or other  extra sounds. Abdomen:  Bowel sounds positive,abdomen soft and non-tender without masses, organomegaly or hernias noted.   Impression & Recommendations:  Problem # 1:  URI (ICD-465.9)  The following medications were removed from  the medication list:    Clarinex 5 Mg Tabs (Desloratadine) .Marland Kitchen... 1 qd His updated medication list for this problem includes:    Bayer Aspirin Ec Low Dose 81 Mg Tbec (Aspirin) .Marland Kitchen... 2 po two times a day  Problem # 2:  CORONARY ARTERY DISEASE (ICD-414.00)  His updated medication list for this problem includes:    Metoprolol Tartrate 25 Mg Tabs (Metoprolol tartrate) .Marland Kitchen... Take 1 tablet by mouth once a day    Nitroquick 0.4 Mg Subl (Nitroglycerin) ..... Use as directed    Bayer Aspirin Ec Low Dose 81 Mg Tbec (Aspirin) .Marland Kitchen... 2 po two times a day  Complete Medication List: 1)  Metoprolol Tartrate 25 Mg Tabs (Metoprolol tartrate) .... Take 1 tablet by mouth once a day 2)  Nexium 40 Mg Cpdr (Esomeprazole magnesium) .... Take 1 capsule by mouth two times a day 3)  Nitroquick 0.4 Mg Subl (Nitroglycerin) .... Use as directed 4)  Bayer Aspirin Ec Low Dose 81 Mg Tbec (Aspirin) .... 2 po two times a day 5)  Lipitor 40 Mg Tabs (Atorvastatin calcium) .... Once daily 6)  Tricor 145 Mg Tabs (Fenofibrate) .... Daily 7)  Miralax Powd (Polyethylene glycol 3350) .Marland Kitchen.. 1 measure  each day am or pm for a regular bm 8)  Ocuvite Preservision Tabs (Multiple vitamins-minerals) 9)  Triamcinolone in Absorbase 0.05 % Oint (Triamcinolone acetonide) .... Apply once daily to affected area 10)  Cetaphil Defense Spf 50 Crea (Sunscreens) .... Apply daily  Patient Instructions: 1)  Get plenty of rest, drink lots of clear liquids, and use Tylenol or Ibuprofen for fever and comfort. Return in 7-10 days if you're not better:sooner if you're feeling worse.   Orders Added: 1)  Est. Patient Level III [16109]

## 2010-04-30 NOTE — Assessment & Plan Note (Signed)
Summary: Cardiology Nuclear Study  Nuclear Med Background Indications for Stress Test: Stent Patency, PTCA Patency   History: Angioplasty, Heart Catheterization, Myocardial Perfusion Study, Stents  History Comments: '06 Heart Cath EF>60%-Angioplasty-Stents LAD N/O CAD 05/09 MPS NL EF 65%  Symptoms: Chest Tightness, Dizziness, Nausea, Palpitations, SOB    Nuclear Pre-Procedure Cardiac Risk Factors: Family History - CAD, History of Smoking, Lipids, TIA Caffeine/Decaff Intake: None NPO After: 8:00 PM Lungs: clear IV 0.9% NS with Angio Cath: 20g     IV Site: (R) AC IV Started by: Irean Hong RN Chest Size (in) 40     Height (in): 71 Weight (lb): 169 BMI: 23.66  Nuclear Med Study 1 or 2 day study:  1 day     Stress Test Type:  Stress Reading MD:  Arvilla Meres, MD     Referring MD:  Shela Commons.Hochrein Resting Radionuclide:  Technetium 4m Tetrofosmin     Resting Radionuclide Dose:  10.6 mCi  Stress Radionuclide:  Technetium 76m Tetrofosmin     Stress Radionuclide Dose:  33.0 mCi   Stress Protocol Exercise Time (min):  6:00 min     Max HR:  137 bpm     Predicted Max HR:  136 bpm  Max Systolic BP: 207 mm Hg     Percent Max HR:  100.74 %     METS: 7.0 Rate Pressure Product:  16109    Stress Test Technologist:  Milana Na EMT-P     Nuclear Technologist:  Harlow Asa CNMT  Rest Procedure  Myocardial perfusion imaging was performed at rest 45 minutes following the intravenous administration of Myoview Technetium 23m Tetrofosmin.  Stress Procedure  The patient exercised for 6:00. The patient stopped due to fatigue, sob, bilateral leg tightness, and chest tightness.  There were no significant ST-T wave changes and occ pacs/pvcs.  Myoview was injected at peak exercise and myocardial perfusion imaging was performed after a brief delay.  QPS Raw Data Images:  Normal; no motion artifact; normal heart/lung ratio. Stress Images:  There is normal uptake in all areas. Rest Images:   Normal homogeneous uptake in all areas of the myocardium. Subtraction (SDS):  Normal Transient Ischemic Dilatation:  1.05  (Normal <1.22)  Lung/Heart Ratio:  .30  (Normal <0.45)  Quantitative Gated Spect Images QGS EDV:  86 ml QGS ESV:  27 ml QGS EF:  69 % QGS cine images:  Normal  Findings Normal nuclear study      Overall Impression  Exercise Capacity: Fair exercise capacity. BP Response: Hypertensive blood pressure response. Clinical Symptoms: CP and dyspnea predomianntly in recovery. + bilateral leg tightness with walking ECG Impression: No significant ST segment change suggestive of ischemia. Overall Impression: Normal stress nuclear study. Overall Impression Comments: Normal myoview. Exertional leg tightness suggestive of possible PAD.   Appended Document: Cardiology Nuclear Study No evidence of ischemia.  Appended Document: Cardiology Nuclear Study pt's wife aware of results.

## 2010-04-30 NOTE — Op Note (Signed)
Summary: Intravitreal Injection/[facility]  Intravitreal Injection/[facility]   Imported By: Maryln Gottron 07/25/2009 15:15:00  _____________________________________________________________________  External Attachment:    Type:   Image     Comment:   External Document

## 2010-04-30 NOTE — Progress Notes (Signed)
Summary: pt having very little chest tightness   Phone Note Call from Patient Call back at Home Phone [phone]   Caller: Patient Reason for Call: Talk to Nurse, Talk to Doctor Summary of Call: per pt day before yesterday he had chest tightness upon excertion ,loss of appetite,numbness in his chest. per pt he is having very little tightness right now and pcp told him to call and tell us about it. Initial call taken by: Omer Jack,  June 17, 2009 4:02 PM  Follow-up for Phone Call        episode lasted 2 hours SOB, chest tightness, pt was at home.  took a NTG did not help. If starts walking now the tightness comes comes back. can be walking for about 5 min and pain starts.  he has no energy.  Discussed the above with Dr Eden Emms.  Pt to have a stress test this week and follow up with Dr Antoine Poche asap.      Patient Instructions: 1)  Your physician recommends that you schedule a follow-up appointment in: asap with Dr Antoine Poche after stress test

## 2010-04-30 NOTE — Assessment & Plan Note (Signed)
Summary: burning sensation on pts tongue/hurts to eat/cjr   Vital Signs:  Patient profile:   77 year old male Height:      71 inches (180.34 cm) Weight:      168 pounds (76.36 kg) O2 Sat:      98 % on Room air Temp:     98.4 degrees F (36.89 degrees C) oral Pulse rate:   86 / minute BP sitting:   128 / 62  (left arm) Cuff size:   regular  Vitals Entered By: Josph Macho RMA (December 23, 2009 3:02 PM)  O2 Flow:  Room air CC: Burning sensation on tongue Xseveral months/ hurts to eat/ CF Is Patient Diabetic? No   History of Present Illness: patient is 77 yo Caucasian male in today concerned about lesions and pain on the tip of his tongue. He first noted symptoms back in July and they will not resolve. of note he took an extended course of antibiotics in May and June for a cellulitis on his arm. The cellulitis on his arm fully resolved unfortunately in July he developed small painful bumps on the tip of his tongue. Over the next few weeks he then began to develop some pain and soreness on the left side of his tongue. The symptoms on the left side resolved and he now just left of the pain on the tip of his tongue. He denies any recent flare his heartburn. His bowels are moving comfortably, no black or tarry stool. Nexium controls his reflux most days. He said no dyspepsia, belching, sore throat or sour taste in his mouth in the morning. He denies any chest pain, palpitations, shortness of breath, GI or GU complaints at this time  Current Medications (verified): 1)  Metoprolol Tartrate 25 Mg Tabs (Metoprolol Tartrate) .... Take 1 Tablet By Mouth Once A Day 2)  Nexium 40 Mg Cpdr (Esomeprazole Magnesium) .... Take 1 Capsule By Mouth Two Times A Day 3)  Nitroquick 0.4 Mg Subl (Nitroglycerin) .... Use As Directed 4)  Bayer Aspirin Ec Low Dose 81 Mg Tbec (Aspirin) .... 2 Po Two Times A Day 5)  Lipitor 40 Mg Tabs (Atorvastatin Calcium) .... Once Daily 6)  Tricor 145 Mg Tabs (Fenofibrate) ....  Daily 7)  Miralax  Powd (Polyethylene Glycol 3350) .Marland Kitchen.. 1 Measure  Each Day Am or Pm For A Regular Bm 8)  Ocuvite Preservision  Tabs (Multiple Vitamins-Minerals) 9)  Lexapro 10 Mg Tabs (Escitalopram Oxalate) .Marland Kitchen.. 1 Once Daily For Anxiety and Depression 10)  Triamcinolone in Absorbase 0.05 % Oint (Triamcinolone Acetonide) .... Apply Once Daily To Affected Area 11)  Cetaphil Defense Spf 50  Crea (Sunscreens) .... Apply Daily 12)  Lexapro 20 Mg Tabs (Escitalopram Oxalate) .Marland Kitchen.. 1 Once Daily For Anxiety Depression 13)  Clarinex 5 Mg Tabs (Desloratadine) .Marland Kitchen.. 1 Qd 14)  Clobetasol Propionate 0.05 % Oint (Clobetasol Propionate) 15)  Medrol 8 Mg Tabs (Methylprednisolone) .Marland Kitchen.. 1 Two Times A Day Pc For 7 Days Then 1 Once Daily For 11 Days 16)  Hydroxyzine Hcl 25 Mg Tabs (Hydroxyzine Hcl) .Marland Kitchen.. 1 Morn Midaternoon and Hs To Prevent Rash and Itching  Allergies (verified): 1)  ! * Pcn Derivatives 2)  ! * Int. Crestor 3)  ! * Doxycycline 4)  ! Bactrim 5)  Amoxicillin (Amoxicillin) 6)  Erythromycin (Erythromycin)  Past History:  Past medical history reviewed for relevance to current acute and chronic problems. Social history (including risk factors) reviewed for relevance to current acute and chronic problems.  Past  Medical History: Reviewed history from 04/09/2009 and no changes required. CONSTIPATION (ICD-564.00) IRRITABLE BOWEL SYNDROME (ICD-564.1) HEMORRHOIDS (ICD-455.6) DIVERTICULAR DISEASE (ICD-562.10) GASTRITIS (ICD-535.50) PERSONAL HX COLONIC POLYPS (ICD-V12.72) GERD (ICD-530.81) ANXIETY DEPRESSION (ICD-300.4) ELEVATED PROSTATE SPECIFIC ANTIGEN (ICD-790.93) VITAMIN B12 DEFICIENCY (ICD-266.2) UTI (ICD-599.0) ARTHRITIS, CERVICAL SPINE (ICD-721.90) SCIATICA (ICD-724.3) ANEMIA (ICD-285.9) TRANSIENT ISCHEMIC ATTACK, HX OF (ICD-V12.50) PEPTIC ULCER DISEASE (ICD-533.90) HYPERLIPIDEMIA (ICD-272.4) CORONARY ARTERY DISEASE (ICD-414.00) (PTCA and stenting of proximal LAD with 75%     stenosis  reduced to 0%.  This was in 2006.  Stress perfusion study     in April 2009 demonstrated well-preserved ejection fraction with no     evidence of ischemia or infarct)  Social History: Reviewed history from 04/09/2009 and no changes required. Retired  Married Former Smoker-stopped 1974 Alcohol use-yes-occasional Drug use-no Daily Caffeine Use 1 cup/day Patient gets regular exercise.  Review of Systems      See HPI  Physical Exam  General:  Well-developed,well-nourished,in no acute distress; alert,appropriate and cooperative throughout examination Head:  Normocephalic and atraumatic without obvious abnormalities. No apparent alopecia or balding. Ears:  External ear exam shows no significant lesions or deformities.  Otoscopic examination reveals clear canals, tympanic membranes are intact bilaterally without bulging, retraction, inflammation or discharge. Hearing is grossly normal bilaterally. Nose:  External nasal examination shows no deformity or inflammation. Nasal mucosa are pink and moist without lesions or exudates. Mouth:  Oral mucosa and oropharynx without lesions or exudates.  upper and lower dentures noted. Tongue  at tip erythematous and mildly denuded of normal papillae. Mild white coat on tongue Neck:  No deformities, masses, or tenderness noted. Lungs:  Normal respiratory effort, chest expands symmetrically. Lungs are clear to auscultation, no crackles or wheezes. Heart:  Normal rate and regular rhythm. S1 and S2 normal without gallop, murmur, click, rub or other extra sounds. Abdomen:  Bowel sounds positive,abdomen soft and non-tender without masses, organomegaly or hernias noted. Extremities:  No clubbing, cyanosis, edema, or deformity noted with normal full range of motion of all joints.   Psych:  Cognition and judgment appear intact. Alert and cooperative with normal attention span and concentration. No apparent delusions, illusions, hallucinations   Impression &  Recommendations:  Problem # 1:  CANDIDIASIS, ORAL (ICD-112.0) Likely secondary to prolonged antibiotic use earlier this year. Will have him start a probiotic daily and start him on Fluconazole 100mg  q wk and Magic Mouthwash as directed, report if symptoms worsen or do not improve.  Problem # 2:  CELLULITIS, ARM (ICD-682.3) Resolved with antibiotic treatment, no recurrence off of medicaitons.  Problem # 3:  GERD (ICD-530.81)  His updated medication list for this problem includes:    Nexium 40 Mg Cpdr (Esomeprazole magnesium) .Marland Kitchen... Take 1 capsule by mouth two times a day No recent flare in symptoms, well controlled on Nexium avoid offending foods.  Complete Medication List: 1)  Metoprolol Tartrate 25 Mg Tabs (Metoprolol tartrate) .... Take 1 tablet by mouth once a day 2)  Nexium 40 Mg Cpdr (Esomeprazole magnesium) .... Take 1 capsule by mouth two times a day 3)  Nitroquick 0.4 Mg Subl (Nitroglycerin) .... Use as directed 4)  Bayer Aspirin Ec Low Dose 81 Mg Tbec (Aspirin) .... 2 po two times a day 5)  Lipitor 40 Mg Tabs (Atorvastatin calcium) .... Once daily 6)  Tricor 145 Mg Tabs (Fenofibrate) .... Daily 7)  Miralax Powd (Polyethylene glycol 3350) .Marland Kitchen.. 1 measure  each day am or pm for a regular bm 8)  Ocuvite Preservision Tabs (Multiple  vitamins-minerals) 9)  Lexapro 10 Mg Tabs (Escitalopram oxalate) .Marland Kitchen.. 1 once daily for anxiety and depression 10)  Triamcinolone in Absorbase 0.05 % Oint (Triamcinolone acetonide) .... Apply once daily to affected area 11)  Cetaphil Defense Spf 50 Crea (Sunscreens) .... Apply daily 12)  Lexapro 20 Mg Tabs (Escitalopram oxalate) .Marland Kitchen.. 1 once daily for anxiety depression 13)  Clarinex 5 Mg Tabs (Desloratadine) .Marland Kitchen.. 1 qd 14)  Clobetasol Propionate 0.05 % Oint (Clobetasol propionate) 15)  Medrol 8 Mg Tabs (Methylprednisolone) .Marland Kitchen.. 1 two times a day pc for 7 days then 1 once daily for 11 days 16)  Hydroxyzine Hcl 25 Mg Tabs (Hydroxyzine hcl) .Marland Kitchen.. 1 morn  midaternoon and hs to prevent rash and itching 17)  Diflucan 100 Mg Tabs (Fluconazole) .Marland Kitchen.. 1 tab by mouth q week x 2 18)  First-dukes Mouthwash Susp (Diphenhyd-hydrocort-nystatin) .... 5 cc by mouth 4 x daily as needed mouth sores, as needed pain x 7days  Patient Instructions: 1)  Please schedule a follow-up appointment as needed if symptoms worsenor do not improve. 2)  Avoid acidic and salty foods 3)  Start a probiotic, such as Align caps daily for the next one to two months. Consider a daily yogurt as well Prescriptions: FIRST-DUKES MOUTHWASH  SUSP (DIPHENHYD-HYDROCORT-NYSTATIN) 5 cc by mouth 4 x daily as needed mouth sores, as needed pain x 7days  #240cc x 1   Entered and Authorized by:   Danise Edge MD   Signed by:   Danise Edge MD on 12/23/2009   Method used:   Electronically to        [id number] (retail)       [address]       [place], [place]  [address]       Ph: [phone] or [phone]       Fax: [phone]   RxID:   [id number] DIFLUCAN 100 MG TABS (FLUCONAZOLE) 1 tab by mouth q week x 2  #2 x 1   Entered and Authorized by:   Danise Edge MD   Signed by:   Danise Edge MD on 12/23/2009   Method used:   Electronically to        [id number] (retail)       [address]       [place], [place]  [address]       Ph: [phone] or [phone]       Fax: [phone]   RxID:   [id number]

## 2010-04-30 NOTE — Assessment & Plan Note (Signed)
Summary: 6 WK ROV // RS/PT RSC/CJR   Vital Signs:  Patient profile:   77 year old male Weight:      161 pounds BMI:     22.54 O2 Sat:      67 % Temp:     97.8 degrees F Pulse rate:   67 / minute Pulse rhythm:   regular BP sitting:   120 / 80  (left arm)  Vitals Entered By: Pura Spice, RN (September 18, 2009 9:10 AM) CC: 6 wk ck up stopped taking lexapro and stated the bactrim changed taste on his taste buds    History of Present Illness: This 77 year old white male is in for 6 week followup exam regarding a rash on his arm resulting from an insect bite and infection and is now improved and gone away with no evidence of a rash no problem regarding this matter. He also relates to discuss chondritis is much better no problem at this time We discussed to some length his problem with anxiety and depression over the past several months and since starting Lexapro 10 mg a day he is functioning much better Blood pressure is good and has no cardiac symptoms  Allergies: 1)  ! * Pcn Derivatives 2)  ! * Int. Crestor 3)  ! * Doxycycline 4)  ! Bactrim 5)  Amoxicillin (Amoxicillin) 6)  Erythromycin (Erythromycin)  Past History:  Past Medical History: Last updated: 04/09/2009 CONSTIPATION (ICD-564.00) IRRITABLE BOWEL SYNDROME (ICD-564.1) HEMORRHOIDS (ICD-455.6) DIVERTICULAR DISEASE (ICD-562.10) GASTRITIS (ICD-535.50) PERSONAL HX COLONIC POLYPS (ICD-V12.72) GERD (ICD-530.81) ANXIETY DEPRESSION (ICD-300.4) ELEVATED PROSTATE SPECIFIC ANTIGEN (ICD-790.93) VITAMIN B12 DEFICIENCY (ICD-266.2) UTI (ICD-599.0) ARTHRITIS, CERVICAL SPINE (ICD-721.90) SCIATICA (ICD-724.3) ANEMIA (ICD-285.9) TRANSIENT ISCHEMIC ATTACK, HX OF (ICD-V12.50) PEPTIC ULCER DISEASE (ICD-533.90) HYPERLIPIDEMIA (ICD-272.4) CORONARY ARTERY DISEASE (ICD-414.00) (PTCA and stenting of proximal LAD with 75%     stenosis reduced to 0%.  This was in 2006.  Stress perfusion study     in April 2009 demonstrated well-preserved  ejection fraction with no     evidence of ischemia or infarct)  Past Surgical History: Last updated: 04/09/2009 Cataract extraction-left eye Cholecystectomy Hernia Surgery Hip surgery after fracture 1/09-right Rt. sub. mand. gland extraction 9/09 Stent-05/06/2004 EGD-07/26/2006 Colonoscopy-07/07/2006  Social History: Last updated: 04/09/2009 Retired  Married Former Smoker-stopped 1974 Alcohol use-yes-occasional Drug use-no Daily Caffeine Use 1 cup/day Patient gets regular exercise.  Risk Factors: Smoking Status: quit (10/19/2006)  Review of Systems      See HPI  The patient denies anorexia, fever, weight loss, weight gain, vision loss, decreased hearing, hoarseness, chest pain, syncope, dyspnea on exertion, peripheral edema, prolonged cough, headaches, hemoptysis, abdominal pain, melena, hematochezia, severe indigestion/heartburn, hematuria, incontinence, genital sores, muscle weakness, suspicious skin lesions, transient blindness, difficulty walking, depression, unusual weight change, abnormal bleeding, enlarged lymph nodes, angioedema, breast masses, and testicular masses.    Physical Exam  General:  Well-developed,well-nourished,in no acute distress; alert,appropriate and cooperative throughout examination Chest Wall:  no costochondral tenderness Lungs:  Normal respiratory effort, chest expands symmetrically. Lungs are clear to auscultation, no crackles or wheezes. Heart:  Normal rate and regular rhythm. S1 and S2 normal without gallop, murmur, click, rub or other extra sounds. Abdomen:  Bowel sounds positive,abdomen soft and non-tender without masses, organomegaly or hernias noted. Extremities:  no edema Psych:  Oriented X3.  appear slightly depressed but much improved   Impression & Recommendations:  Problem # 1:  COSTOCHONDRITIS, LEFT (ICD-733.6) Assessment Improved  Problem # 2:  CELLULITIS, ARM (ICD-682.3) Assessment: Improved  His updated  medication list for  this problem includes:    Bactrim Ds 800-160 Mg Tabs (Sulfamethoxazole-trimethoprim)became allergic to Bactrim  Problem # 3:  INSECT BITE (ICD-919.4) Assessment: Improved  Problem # 4:  ANXIETY DEPRESSION (ICD-300.4) Assessment: Improved  Problem # 5:  COSTOCHONDRITIS, LEFT (ICD-733.6)  Complete Medication List: 1)  Metoprolol Tartrate 25 Mg Tabs (Metoprolol tartrate) .... Take 1 tablet by mouth once a day 2)  Nexium 40 Mg Cpdr (Esomeprazole magnesium) .... Take 1 capsule by mouth two times a day 3)  Nitroquick 0.4 Mg Subl (Nitroglycerin) .... Use as directed 4)  Bayer Aspirin Ec Low Dose 81 Mg Tbec (Aspirin) .... 2 po two times a day 5)  Lipitor 40 Mg Tabs (Atorvastatin calcium) .... Once daily 6)  Tricor 145 Mg Tabs (Fenofibrate) .... Daily 7)  Miralax Powd (Polyethylene glycol 3350) .Marland Kitchen.. 1 measure  each day am or pm for a regular bm 8)  Ocuvite Preservision Tabs (Multiple vitamins-minerals) 9)  Lexapro 10 Mg Tabs (Escitalopram oxalate) .Marland Kitchen.. 1 once daily for anxiety and depression 10)  Vitamin D (ergocalciferol) 50000 Unit Caps (Ergocalciferol) .Marland KitchenMarland KitchenMarland Kitchen 1 weekly for 12 weeks 11)  Triamcinolone in Absorbase 0.05 % Oint (Triamcinolone acetonide) .... Apply once daily to affected area 12)  Cetaphil Defense Spf 50 Crea (Sunscreens) .... Apply daily 13)  Lexapro 20 Mg Tabs (Escitalopram oxalate) .Marland Kitchen.. 1 once daily for anxiety depression 14)  Clarinex 5 Mg Tabs (Desloratadine) .Marland Kitchen.. 1 qd 15)  Bactrim Ds 800-160 Mg Tabs (Sulfamethoxazole-trimethoprim)  Patient Instructions: 1)  yOU ARE DOING GREAT , CONTINUE SAME MEDICATIONS

## 2010-04-30 NOTE — Miscellaneous (Signed)
  Clinical Lists Changes  Observations: Added new observation of NUCLEAR NOS: Exercise Capacity: Fair exercise capacity. BP Response: Hypertensive blood pressure response. Clinical Symptoms: CP and dyspnea predomianntly in recovery. + bilateral leg tightness with walking ECG Impression: No significant ST segment change suggestive of ischemia. Overall Impression: Normal stress nuclear study. Overall Impression Comments: Normal myoview. Exertional leg tightness suggestive of possible PAD.     (06/19/2009 14:50)      Nuclear Study  Procedure date:  06/19/2009  Findings:      Exercise Capacity: Fair exercise capacity. BP Response: Hypertensive blood pressure response. Clinical Symptoms: CP and dyspnea predomianntly in recovery. + bilateral leg tightness with walking ECG Impression: No significant ST segment change suggestive of ischemia. Overall Impression: Normal stress nuclear study. Overall Impression Comments: Normal myoview. Exertional leg tightness suggestive of possible PAD.

## 2010-04-30 NOTE — Assessment & Plan Note (Signed)
Summary: CPX (PT WILL COME IN FASTING) // RS   Vital Signs:  Patient profile:   77 year old male Weight:      169 pounds O2 Sat:      96 % Temp:     98 degrees F Pulse rate:   76 / minute BP sitting:   140 / 84  (left arm) Cuff size:   regular  Vitals Entered By: Pura Spice, RN (July 09, 2009 10:07 AM)  Contraindications/Deferment of Procedures/Staging:    Test/Procedure: Pneumovax vaccine    Reason for deferment: patient declined     Test/Procedure: TD vaccine    Reason for deferment: declined  CC: go over meds Refill meds and  Dr hochrein per pt thinks anxiety states had thourough cardiac exam    History of Present Illness: This 77 year old white male with known heart or artery disease in the care of Dr. Virgina Jock who feels heart wise he is doing fine but thinks the patient is anxious and needs treatment Patient recently had chest pain for one one half hour and had a complete cardiac workup with no positive findings, impression was that it was anxiety Patient is exercising 3 times per week As decreased appetite has lost weight 176-169 Has macular degeneration and has injections at [facility], every 3 weeks And in his under the care of Dr. Ferne Coe and sees him on a regular basis for his GI problem He is now taken alprazolam oh 0.25 t.i.d. for anxiety  Allergies: 1)  ! * Pcn Derivatives 2)  ! * Int. Crestor 3)  Amoxicillin (Amoxicillin) 4)  Erythromycin (Erythromycin)  Past History:  Past Medical History: Last updated: 04/09/2009 CONSTIPATION (ICD-564.00) IRRITABLE BOWEL SYNDROME (ICD-564.1) HEMORRHOIDS (ICD-455.6) DIVERTICULAR DISEASE (ICD-562.10) GASTRITIS (ICD-535.50) PERSONAL HX COLONIC POLYPS (ICD-V12.72) GERD (ICD-530.81) ANXIETY DEPRESSION (ICD-300.4) ELEVATED PROSTATE SPECIFIC ANTIGEN (ICD-790.93) VITAMIN B12 DEFICIENCY (ICD-266.2) UTI (ICD-599.0) ARTHRITIS, CERVICAL SPINE (ICD-721.90) SCIATICA (ICD-724.3) ANEMIA  (ICD-285.9) TRANSIENT ISCHEMIC ATTACK, HX OF (ICD-V12.50) PEPTIC ULCER DISEASE (ICD-533.90) HYPERLIPIDEMIA (ICD-272.4) CORONARY ARTERY DISEASE (ICD-414.00) (PTCA and stenting of proximal LAD with 75%     stenosis reduced to 0%.  This was in 2006.  Stress perfusion study     in April 2009 demonstrated well-preserved ejection fraction with no     evidence of ischemia or infarct)  Past Surgical History: Last updated: 04/09/2009 Cataract extraction-left eye Cholecystectomy Hernia Surgery Hip surgery after fracture 1/09-right Rt. sub. mand. gland extraction 9/09 Stent-05/06/2004 EGD-07/26/2006 Colonoscopy-07/07/2006  Social History: Last updated: 04/09/2009 Retired  Married Former Smoker-stopped 1974 Alcohol use-yes-occasional Drug use-no Daily Caffeine Use 1 cup/day Patient gets regular exercise.  Risk Factors: Smoking Status: quit (10/19/2006)  Past History:  Care Management: Cardiology: Dr Antoine Poche  Gastroenterology: Dr Russella Dar  Ophthalmology: Dr Hazle Quant  Dermatology:Dr Tafeen   Review of Systems      See HPI  The patient denies anorexia, fever, weight loss, weight gain, vision loss, decreased hearing, hoarseness, chest pain, syncope, dyspnea on exertion, peripheral edema, prolonged cough, headaches, hemoptysis, abdominal pain, melena, hematochezia, severe indigestion/heartburn, hematuria, incontinence, genital sores, muscle weakness, suspicious skin lesions, transient blindness, difficulty walking, depression, unusual weight change, abnormal bleeding, enlarged lymph nodes, angioedema, breast masses, and testicular masses.    Physical Exam  General:  Well-developed,well-nourished,in no acute distress; alert,appropriate and cooperative throughout examination Head:  Normocephalic and atraumatic without obvious abnormalities. No apparent alopecia or balding. Eyes:  No corneal or conjunctival inflammation noted. EOMI. Perrla. Funduscopic exam benign, without hemorrhages, exudates  or papilledema. Vision grossly  normal. Ears:  External ear exam shows no significant lesions or deformities.  Otoscopic examination reveals clear canals, tympanic membranes are intact bilaterally without bulging, retraction, inflammation or discharge. Hearing is grossly normal bilaterally. Nose:  External nasal examination shows no deformity or inflammation. Nasal mucosa are pink and moist without lesions or exudates. Mouth:  Oral mucosa and oropharynx without lesions or exudates.  Teeth in good repair. Neck:  No deformities, masses, or tenderness noted. Chest Wall:  No deformities, masses, tenderness or gynecomastia noted. Breasts:  No masses or gynecomastia noted Lungs:  Normal respiratory effort, chest expands symmetrically. Lungs are clear to auscultation, no crackles or wheezes. Heart:  Normal rate and regular rhythm. S1 and S2 normal without gallop, murmur, click, rub or other extra sounds. Abdomen:  Bowel sounds positive,abdomen soft and non-tender without masses, organomegaly or hernias noted. Rectal:  No external abnormalities noted. Normal sphincter tone. No rectal masses or tenderness. Genitalia:  Testes bilaterally descended without nodularity, tenderness or masses. No scrotal masses or lesions. No penis lesions or urethral discharge. Prostate:  1+ enlarged.   Msk:  No deformity or scoliosis noted of thoracic or lumbar spine.   Pulses:  R and L carotid,radial,femoral,dorsalis pedis and posterior tibial pulses are full and equal bilaterally Extremities:  No clubbing, cyanosis, edema, or deformity noted with normal full range of motion of all joints.   Neurologic:  No cranial nerve deficits noted. Station and gait are normal. Plantar reflexes are down-going bilaterally. DTRs are symmetrical throughout. Sensory, motor and coordinative functions appear intact. Skin:  Intact without suspicious lesions or rashes Cervical Nodes:  No lymphadenopathy noted Axillary Nodes:  No palpable  lymphadenopathy Inguinal Nodes:  No significant adenopathy Psych:  Cognition and judgment appear intact. Alert and cooperative with normal attention span and concentration. No apparent delusions, illusions, hallucinations   Impression & Recommendations:  Problem # 1:  MACULAR DEGENERATION (ICD-362.50) Assessment New injections at [facility]  Problem # 2:  IRRITABLE BOWEL SYNDROME (ICD-564.1) Assessment: Improved  Problem # 3:  GERD (ICD-530.81) Assessment: Improved  His updated medication list for this problem includes:    Nexium 40 Mg Cpdr (Esomeprazole magnesium) .Marland Kitchen... Take 1 capsule by mouth two times a day  Problem # 4:  ANXIETY DEPRESSION (ICD-300.4) Assessment: Deteriorated Lexapro 10 mg q.d.  Problem # 5:  ARTHRITIS, CERVICAL SPINE (ICD-721.90) Assessment: Improved  Problem # 6:  CORONARY ARTERY DISEASE (ICD-414.00) Assessment: Improved  His updated medication list for this problem includes:    Metoprolol Tartrate 25 Mg Tabs (Metoprolol tartrate) .Marland Kitchen... Take 1 tablet by mouth once a day    Nitroquick 0.4 Mg Subl (Nitroglycerin) ..... Use as directed    Bayer Aspirin Ec Low Dose 81 Mg Tbec (Aspirin) .Marland Kitchen... 2 po two times a day  Orders: TLB-BMP (Basic Metabolic Panel-BMET) (80048-METABOL)  Complete Medication List: 1)  Metoprolol Tartrate 25 Mg Tabs (Metoprolol tartrate) .... Take 1 tablet by mouth once a day 2)  Nexium 40 Mg Cpdr (Esomeprazole magnesium) .... Take 1 capsule by mouth two times a day 3)  Nitroquick 0.4 Mg Subl (Nitroglycerin) .... Use as directed 4)  Bayer Aspirin Ec Low Dose 81 Mg Tbec (Aspirin) .... 2 po two times a day 5)  Lipitor 40 Mg Tabs (Atorvastatin calcium) .... Once daily 6)  Tricor 145 Mg Tabs (Fenofibrate) .... Daily 7)  Mucinex Dm Maximum Strength 60-1200 Mg Xr12h-tab (Dextromethorphan-guaifenesin) .... As needed 8)  Bepreve 1.5 % Soln (Bepotastine besilate) .... One drop each eye two times  a day 9)  Macula Protect  .Marland Kitchen.. 4 by  mouth daiy 10)  Miralax Powd (Polyethylene glycol 3350) .Marland Kitchen.. 1 measure  each day am or pm for a regular bm 11)  Doxycycline Hyclate 50 Mg Caps (Doxycycline hyclate) .Marland Kitchen.. 1 by mouth two times a day 12)  Ocuvite Preservision Tabs (Multiple vitamins-minerals) 13)  Lexapro 10 Mg Tabs (Escitalopram oxalate) .Marland Kitchen.. 1 once daily for manxiety and depression 14)  Vitamin D (ergocalciferol) 50000 Unit Caps (Ergocalciferol) .Marland KitchenMarland KitchenMarland Kitchen 1 weekly for 12 weeks  Other Orders: Venipuncture (24401) T-Vitamin D (25-Hydroxy) (02725-36644) UA Dipstick w/o Micro (automated)  (81003) TLB-PSA (Prostate Specific Antigen) (84153-PSA) TLB-Lipid Panel (80061-LIPID) TLB-CBC Platelet - w/Differential (85025-CBCD) TLB-Hepatic/Liver Function Pnl (80076-HEPATIC) TLB-TSH (Thyroid Stimulating Hormone) (84443-TSH)  Patient Instructions: 1)  I feel mphysicxal exam is good with no problrms other than what is being treated 2)   think the Lexapro will help you tremendously, call me at least 1 week  before the samples are gone 3)  have refilled your medications 4)  will call lab results Prescriptions: VITAMIN D (ERGOCALCIFEROL) 50000 UNIT CAPS (ERGOCALCIFEROL) 1 weekly for 12 weeks  #12 x 1   Entered and Authorized by:   Judithann Sheen MD   Signed by:   Judithann Sheen MD on 07/10/2009   Method used:   Electronically to        [id number] (retail)       [address]       [place], [place]  [address]       Ph: [phone] or [phone]       Fax: [phone]   RxID:   [id number] NITROQUICK 0.4 MG SUBL (NITROGLYCERIN) use as directed  #30 x 11   Entered and Authorized by:   Judithann Sheen MD   Signed by:   Judithann Sheen MD on 07/09/2009   Method used:   Electronically to        [id number] (retail)       [address]       [place], [place]  [address]       Ph: [phone] or [phone]       Fax: [phone]   RxID:   [id number] LIPITOR 40 MG TABS (ATORVASTATIN CALCIUM) once  daily  #90 x 3   Entered and Authorized by:   Judithann Sheen MD   Signed by:   Judithann Sheen MD on 07/09/2009   Method used:   Electronically to        [id number] (retail)       [address]       [place], [place]  [address]       Ph: [phone] or [phone]       Fax: [phone]   RxID:   [id number]     Laboratory Results   Urine Tests    Routine Urinalysis   Color: yellow Appearance: Clear Glucose: negative   (Normal Range: Negative) Bilirubin: negative   (Normal Range: Negative) Ketone: negative   (Normal Range: Negative) Spec. Gravity: >=1.030   (Normal Range: 1.003-1.035) Blood: negative   (Normal Range: Negative) pH: 5.5   (Normal Range: 5.0-8.0) Protein: 1+   (Normal Range: Negative) Urobilinogen: 0.2   (Normal Range: 0-1) Nitrite: negative   (Normal Range: Negative) Leukocyte Esterace: negative   (Normal Range: Negative)    Comments: Rita Ohara  July 09, 2009 12:01 PM

## 2010-04-30 NOTE — Progress Notes (Signed)
Summary: Nuclear Pre-Procedure  Phone Note Outgoing Call   Call placed by: Milana Na, EMT-P,  June 18, 2009 1:10 PM Summary of Call: Reviewed information on Myoview Information Sheet (see scanned document for further details).  Spoke with patient.      Nuclear Med Background Indications for Stress Test: Stent Patency, PTCA Patency   History: Angioplasty, Heart Catheterization, Myocardial Perfusion Study, Stents  History Comments: '06 Heart Cath EF>60%-Angioplasty-Stents LAD N/O CAD 05/09 MPS NL EF 65%  Symptoms: Chest Tightness, Dizziness, Nausea, SOB    Nuclear Pre-Procedure Cardiac Risk Factors: Family History - CAD, History of Smoking, Lipids, TIA Height (in): 70  Nuclear Med Study Referring MD:  J.Hochrein

## 2010-04-30 NOTE — Progress Notes (Signed)
Summary: not able to change med list  Phone Note Call from Patient Call back at Home Phone [phone]   Call For: fry for stafford Summary of Call: Hasn't started doxycycline for insect bite,forgot to tell him that it takes appetite, makes him weak, not hungry, losing weight.  Different antibiotic?   [facility]  Allergic to pcn & shouldn't take doxy.  Initial call taken by: Rudy Jew, RN,  Aug 22, 2009 10:03 AM  Follow-up for Phone Call        change this to Bactrim DS, take two times a day for 10 days Follow-up by: Nelwyn Salisbury MD,  Aug 22, 2009 1:29 PM  Additional Follow-up for Phone Call Additional follow up Details #1::        Called in.  Unable to document.  To Almira Coaster to change med list.  Patient notified.   Additional Follow-up by: Rudy Jew, RN,  Aug 22, 2009 2:56 PM    Additional Follow-up for Phone Call Additional follow up Details #2::    allergy to doxycycline added to allergy list Follow-up by: Pura Spice, RN,  Aug 26, 2009 9:00 AM

## 2010-04-30 NOTE — Progress Notes (Signed)
Summary: new rx needed  Phone Note Refill Request Call back at Home Phone [phone] Message from:  Patient--walk in  Refills Requested: Medication #1:  BACTROBAN 2 % OINT apply small amount via clean qtip to each nostril at bedtime x 7 days. [facility]  Initial call taken by: Warnell Forester,  February 11, 2010 10:40 AM  Follow-up for Phone Call        Please advise? Follow-up by: Josph Macho RMA,  February 11, 2010 5:13 PM  Additional Follow-up for Phone Call Additional follow up Details #1::        He can have a refill on bactroban, I wrote it on 11/10, he should be able to get it OTC, see med module for sig Additional Follow-up by: Danise Edge MD,  February 12, 2010 8:40 AM    Prescriptions: Idelle Jo 2 % OINT (MUPIROCIN) apply small amount via clean qtip to each nostril at bedtime x 7 days  #1 tube x 0   Entered by:   Josph Macho RMA   Authorized by:   Danise Edge MD   Signed by:   Josph Macho RMA on 02/12/2010   Method used:   Electronically to        [id number] (ret[facility]       [address]       [place], [place]  [address]       Ph: [phone] or [phone]       Fax: [phone]   RxID:   [id number]

## 2010-04-30 NOTE — Letter (Signed)
Summary: Patient [facility] Biopsy Results  [facility]  [address] [place], [place] [address]   Phone: [phone]  Fax: [phone]        Aug 10, 2009 MRN: [id number]    [facility] [address] [place], [place]  [address]    Dear Mr. Rademaker,  I am pleased to inform you that the biopsies taken during your recent endoscopic examination did not show any evidence of cancer upon pathologic examination. The biopsies showed minimal chronic inflammation and intestinal metaplasia.  Continue with the treatment plan as outlined on the day of your      exam.  Please call us if you are having persistent problems or have questions about your condition that have not been fully answered at this time.  Sincerely,  Meryl Dare MD [facility]  This letter has been electronically signed by your physician.   Appended Document: Patient Notice-Endo Biopsy Results letter mailed

## 2010-04-30 NOTE — Procedures (Signed)
Summary: Upper Endoscopy  Patient: [name] Note: All result statuses are Final unless otherwise noted.  Tests: (1) Upper Endoscopy (EGD)   EGD Upper Endoscopy       DONE     [facility]     [address]     [place], [place]  [address]           ENDOSCOPY PROCEDURE REPORT           PATIENT:  [name], [name]  MR#:  [id number]     BIRTHDATE:  July 06, 1922, 87 yrs. old  GENDER:  male     ENDOSCOPIST:  Judie Petit T. Russella Dar, MD, [facility]           PROCEDURE DATE:  08/06/2009     PROCEDURE:  EGD with biopsy, and with dilatation over guidewire     ASA CLASS:  Class II     INDICATIONS:  dysphagia, weight loss     MEDICATIONS:  Fentanyl 50 mcg IV, Versed 3 mg IV     TOPICAL ANESTHETIC:  Exactacain Spray     DESCRIPTION OF PROCEDURE:   After the risks benefits and     alternatives of the procedure were thoroughly explained, informed     consent was obtained.  The LB GIF-H180 [id number] endoscope was     introduced through the mouth and advanced to the second portion of     the duodenum, without limitations.  The instrument was slowly     withdrawn as the mucosa was fully examined.     <<PROCEDUREIMAGES>>     The esophagus and gastroesophageal junction were completely normal     in appearance. Savary dilation over a guidewire with a 17mm     dilator for dysphagia without a stricture, with no heme or     resistance noted. The duodenal bulb was normal in appearance, as     was the postbulbar duodenum.  Mild gastritis was found in the body     and the antrum of the stomach. It was patchy, erythematous and     granular. Biopsies of the antrum and body of the stomach were     obtained and sent to pathology.  Retroflexed views revealed no     abnormalities. The scope was then withdrawn from the patient and     the procedure completed.           COMPLICATIONS:  None           ENDOSCOPIC IMPRESSION:     1) Mild gastritis     RECOMMENDATIONS:     1) Anti-reflux regimen     2) Await  pathology results     3) continue PPI     4) post dilation instructions     5) follow-up: primary MD as planned           Vitoria Conyer T. Russella Dar, MD, [facility]           CC:  Tawny Asal, MD           n.     Rosalie DoctorVenita Lick. Satara Virella at 08/06/2009 03:27 PM           Loder, [place], [id number]  Note: An exclamation mark (!) indicates a result that was not dispersed into the flowsheet. Document Creation Date: 08/06/2009 3:29 PM _______________________________________________________________________  (1) Order result status: Final Collection or observation date-time: 08/06/2009 15:22 Requested date-time:  Receipt date-time:  Reported date-time:  Referring Physician:   Ordering Physician: Claudette Head (  [id number]) Specimen Source:  Source: Launa Grill Order Number: [id number] Lab site:

## 2010-04-30 NOTE — Assessment & Plan Note (Signed)
Summary: to discuss new Ca diagnosis/dm   Vital Signs:  Patient profile:   77 year old male Weight:      166 pounds O2 Sat:      82 % Temp:     98.2 degrees F Pulse rate:   82 / minute BP sitting:   110 / 74  (left arm)  Vitals Entered By: Pura Spice, RN (July 23, 2009 1:06 PM) CC: consult   Allergies: 1)  ! * Pcn Derivatives 2)  ! * Int. Crestor 3)  Amoxicillin (Amoxicillin) 4)  Erythromycin (Erythromycin)  Past History:  Past Medical History: Last updated: 04/09/2009 CONSTIPATION (ICD-564.00) IRRITABLE BOWEL SYNDROME (ICD-564.1) HEMORRHOIDS (ICD-455.6) DIVERTICULAR DISEASE (ICD-562.10) GASTRITIS (ICD-535.50) PERSONAL HX COLONIC POLYPS (ICD-V12.72) GERD (ICD-530.81) ANXIETY DEPRESSION (ICD-300.4) ELEVATED PROSTATE SPECIFIC ANTIGEN (ICD-790.93) VITAMIN B12 DEFICIENCY (ICD-266.2) UTI (ICD-599.0) ARTHRITIS, CERVICAL SPINE (ICD-721.90) SCIATICA (ICD-724.3) ANEMIA (ICD-285.9) TRANSIENT ISCHEMIC ATTACK, HX OF (ICD-V12.50) PEPTIC ULCER DISEASE (ICD-533.90) HYPERLIPIDEMIA (ICD-272.4) CORONARY ARTERY DISEASE (ICD-414.00) (PTCA and stenting of proximal LAD with 75%     stenosis reduced to 0%.  This was in 2006.  Stress perfusion study     in April 2009 demonstrated well-preserved ejection fraction with no     evidence of ischemia or infarct)  Past Surgical History: Last updated: 04/09/2009 Cataract extraction-left eye Cholecystectomy Hernia Surgery Hip surgery after fracture 1/09-right Rt. sub. mand. gland extraction 9/09 Stent-05/06/2004 EGD-07/26/2006 Colonoscopy-07/07/2006  Social History: Last updated: 04/09/2009 Retired  Married Former Smoker-stopped 1974 Alcohol use-yes-occasional Drug use-no Daily Caffeine Use 1 cup/day Patient gets regular exercise.  Risk Factors: Smoking Status: quit (10/19/2006)   Impression & Recommendations:  Problem # 1:  ROSACEA (ICD-695.3) Assessment Deteriorated Tridesilon b.i.d.  Problem # 2:  DYSPHAGIA  UNSPECIFIED (ICD-787.20) Assessment: Deteriorated recommend continue Nexium but consult Dr.Starkegarding endoscopy and further evaluation and treatment  Problem # 3:  MACULAR DEGENERATION (ICD-362.50) Assessment: Unchanged  Problem # 4:  GERD (ICD-530.81) Assessment: Deteriorated  His updated medication list for this problem includes:    Nexium 40 Mg Cpdr (Esomeprazole magnesium) .Marland Kitchen... Take 1 capsule by mouth two times a day  Problem # 5:  ANXIETY DEPRESSION (ICD-300.4) Assessment: Improved  Complete Medication List: 1)  Metoprolol Tartrate 25 Mg Tabs (Metoprolol tartrate) .... Take 1 tablet by mouth once a day 2)  Nexium 40 Mg Cpdr (Esomeprazole magnesium) .... Take 1 capsule by mouth two times a day 3)  Nitroquick 0.4 Mg Subl (Nitroglycerin) .... Use as directed 4)  Bayer Aspirin Ec Low Dose 81 Mg Tbec (Aspirin) .... 2 po two times a day 5)  Lipitor 40 Mg Tabs (Atorvastatin calcium) .... Once daily 6)  Tricor 145 Mg Tabs (Fenofibrate) .... Daily 7)  Miralax Powd (Polyethylene glycol 3350) .Marland Kitchen.. 1 measure  each day am or pm for a regular bm 8)  Ocuvite Preservision Tabs (Multiple vitamins-minerals) 9)  Lexapro 10 Mg Tabs (Escitalopram oxalate) .Marland Kitchen.. 1 once daily for anxiety and depression 10)  Vitamin D (ergocalciferol) 50000 Unit Caps (Ergocalciferol) .Marland KitchenMarland KitchenMarland Kitchen 1 weekly for 12 weeks 11)  Triamcinolone in Absorbase 0.05 % Oint (Triamcinolone acetonide) .... Apply once daily to affected area 12)  Cetaphil Defense Spf 50 Crea (Sunscreens) .... Apply daily  Patient Instructions: 1)  esophageal spasm and dysphagia recommend continuing treatment under Dr. Russella Dar 2)  Continue Tridesilon for rosacea

## 2010-04-30 NOTE — Assessment & Plan Note (Signed)
Summary: F/U Constipation, saw NP   History of Present Illness Visit Type: follow up  Primary GI MD: Elie Goody MD [facility] Primary Provider: Rickard Patience, MD Requesting Provider: n/a Chief Complaint: F/u for constipation. Pt states that he is better and denies any GI complaints  History of Present Illness:   [name] returns today for followup of constipation,gas and bloating. His constipation resolved with the regular use of MiraLax. He states recently he has not needed MiraLax. He is concerned about the cause of his underlying constipation is concerned about the possibility of an underlying colon stricture or colon cancer. His colonoscopy in April 2008 revealed diverticulosis, hemorrhoids, and small colon polyps and I reassured him that it was very unlikely to be colon cancer or colon stricture. However if his symptoms persist or worsen, we will further evaluate.    GI Review of Systems      Denies abdominal pain, acid reflux, belching, bloating, chest pain, dysphagia with liquids, dysphagia with solids, heartburn, loss of appetite, nausea, vomiting, vomiting blood, weight loss, and  weight gain.        Denies anal fissure, black tarry stools, change in bowel habit, constipation, diarrhea, diverticulosis, fecal incontinence, heme positive stool, hemorrhoids, irritable bowel syndrome, jaundice, light color stool, liver problems, rectal bleeding, and  rectal pain.   Current Medications (verified): 1)  Metoprolol Tartrate 25 Mg Tabs (Metoprolol Tartrate) .... Take 1 Tablet By Mouth Once A Day 2)  Nexium 40 Mg Cpdr (Esomeprazole Magnesium) .... Take 1 Capsule By Mouth Two Times A Day 3)  Nitroquick 0.4 Mg Subl (Nitroglycerin) .... Use As Directed 4)  Bayer Aspirin Ec Low Dose 81 Mg Tbec (Aspirin) .... 2 Po Two Times A Day 5)  Lipitor 40 Mg Tabs (Atorvastatin Calcium) .... Once Daily 6)  Tricor 145 Mg Tabs (Fenofibrate) .... Daily 7)  Mucinex Dm Maximum Strength 60-1200 Mg  Xr12h-Tab (Dextromethorphan-Guaifenesin) .... As Needed 8)  Bepreve 1.5 % Soln (Bepotastine Besilate) .... One Drop Each Eye Two Times A Day 9)  Macula Protect .Marland Kitchen.. 4 By Mouth Daiy 10)  Miralax  Powd (Polyethylene Glycol 3350) .Marland Kitchen.. 1 Measure  Each Day Am or Pm For A Regular Bm  Allergies (verified): 1)  ! * Pcn Derivatives 2)  Amoxicillin (Amoxicillin) 3)  Erythromycin (Erythromycin)  Past History:  Past Medical History: CONSTIPATION (ICD-564.00) IRRITABLE BOWEL SYNDROME (ICD-564.1) HEMORRHOIDS (ICD-455.6) DIVERTICULAR DISEASE (ICD-562.10) GASTRITIS (ICD-535.50) PERSONAL HX COLONIC POLYPS (ICD-V12.72) GERD (ICD-530.81) ANXIETY DEPRESSION (ICD-300.4) ELEVATED PROSTATE SPECIFIC ANTIGEN (ICD-790.93) VITAMIN B12 DEFICIENCY (ICD-266.2) UTI (ICD-599.0) ARTHRITIS, CERVICAL SPINE (ICD-721.90) SCIATICA (ICD-724.3) ANEMIA (ICD-285.9) TRANSIENT ISCHEMIC ATTACK, HX OF (ICD-V12.50) PEPTIC ULCER DISEASE (ICD-533.90) HYPERLIPIDEMIA (ICD-272.4) CORONARY ARTERY DISEASE (ICD-414.00) (PTCA and stenting of proximal LAD with 75%     stenosis reduced to 0%.  This was in 2006.  Stress perfusion study     in April 2009 demonstrated well-preserved ejection fraction with no     evidence of ischemia or infarct)  Past Surgical History: Cataract extraction-left eye Cholecystectomy Hernia Surgery Hip surgery after fracture 1/09-right Rt. sub. mand. gland extraction 9/09 Stent-05/06/2004 EGD-07/26/2006 Colonoscopy-07/07/2006  Family History: Family History Hypertension Family History of Cardiovascular disorder Family History of Colon Cancer: Mat. Uncle ?15 yo Family History of Coronary Artery Disease: Father  Social History: Retired  Married Former Smoker-stopped 1974 Alcohol use-yes-occasional Drug use-no Daily Caffeine Use 1 cup/day Patient gets regular exercise.  Review of Systems       The pertinent positives and negatives are noted as above and in  the HPI. All other ROS were reviewed  and were negative.   Vital Signs:  Patient profile:   77 year old male Height:      70 inches Weight:      174 pounds BSA:     1.97 Pulse rate:   72 / minute Pulse rhythm:   regular BP sitting:   98 / 64  (right arm) Cuff size:   regular  Vitals Entered By: Ok Anis CMA (April 09, 2009 2:14 PM)  Physical Exam  General:  Well developed, well nourished, no acute distress. Head:  Normocephalic and atraumatic. Eyes:  PERRLA, no icterus. Mouth:  No deformity or lesions, dentition normal. Lungs:  Clear throughout to auscultation. Heart:  Regular rate and rhythm; no murmurs, rubs,  or bruits. Abdomen:  Soft, nontender and nondistended. No masses, hepatosplenomegaly or hernias noted. Normal bowel sounds. Psych:  Alert and cooperative. Normal mood and affect.  Impression & Recommendations:  Problem # 1:  CONSTIPATION (ICD-564.00) MiraLax b.i.d. as needed. Increase daily water and fiber intake. Pt. reassured-see HPI.  Problem # 2:  FLATULENCE-GAS-BLOATING (ICD-787.3) Begin a low gas diet and Align once daily.  Problem # 3:  PERSONAL HX COLONIC POLYPS (ICD-V12.72) Personal history of adenomatous colon polyps diagnosed in May 2000. Surveillance colonoscopy recommended in April 2013 if his health status appropriate.  Patient Instructions: 1)  Start Align one tablet by mouth once daily x 1 month. 2)  Please continue current medications.  3)  Copy sent to : Rickard Patience, MD 4)  The medication list was reviewed and reconciled.  All changed / newly prescribed medications were explained.  A complete medication list was provided to the patient / caregiver. 5)  Please schedule a follow-up appointment as needed.

## 2010-05-09 ENCOUNTER — Other Ambulatory Visit: Payer: Self-pay | Admitting: Family Medicine

## 2010-05-11 ENCOUNTER — Telehealth: Payer: Self-pay | Admitting: *Deleted

## 2010-05-11 NOTE — Telephone Encounter (Signed)
Has appt for cataract surgery but has cancelled due to kidney pain.  Wanted Dr. Scotty Court to know.

## 2010-05-13 ENCOUNTER — Other Ambulatory Visit: Payer: Self-pay | Admitting: Family Medicine

## 2010-05-13 NOTE — Telephone Encounter (Signed)
Has appt 10AM on 2/16

## 2010-05-14 ENCOUNTER — Ambulatory Visit (INDEPENDENT_AMBULATORY_CARE_PROVIDER_SITE_OTHER): Payer: Medicare Other | Admitting: Family Medicine

## 2010-05-14 VITALS — BP 140/86 | HR 84 | Wt 176.0 lb

## 2010-05-14 DIAGNOSIS — E785 Hyperlipidemia, unspecified: Secondary | ICD-10-CM

## 2010-05-14 DIAGNOSIS — R05 Cough: Secondary | ICD-10-CM

## 2010-05-14 DIAGNOSIS — I2510 Atherosclerotic heart disease of native coronary artery without angina pectoris: Secondary | ICD-10-CM

## 2010-05-14 MED ORDER — ATORVASTATIN CALCIUM 40 MG PO TABS: 40.0000 mg | ORAL_TABLET | Freq: Every day | ORAL | Status: DC

## 2010-05-25 ENCOUNTER — Encounter: Payer: Self-pay | Admitting: Family Medicine

## 2010-05-25 NOTE — Progress Notes (Signed)
  Subjective:    Patient ID: [name], male    DOB: 06-23-1922, 77 y.o.   MRN: [id number] This 77 year old white male was in the discussed treatment for hyperlipidemia. He had some concern when they changed his Prandin Lipitor to generic and wanted to discuss this with me. Which we agreed that generic is satisfactory. He relates he continues to have some postnasal drainage nasal congestion resulting in call which is not as severe as it has been in the past. He decided to stop the air sat thinking it made no difference in his memory loss. He has no coronary symptoms at this time and his GERD appears to be control uric continues to have episodes of pain from his cervical arthritis. He relates he continues to see the urologist regarding his elevated PSA in the past HPI    Review of Systems see history of present illness    Objective:   Physical Exam Patient is well-developed well-nourished cooperative male who does not appear to be ill and in no distress examination of the head eyes ears nose and throat are revealed postnasal drainage Heart and lungs are clear       Assessment & Plan:  Patient doing well and he agrees to take generic Lipitor continue other medications has decided to stop  Aricept

## 2010-06-04 ENCOUNTER — Ambulatory Visit (INDEPENDENT_AMBULATORY_CARE_PROVIDER_SITE_OTHER)
Admission: RE | Admit: 2010-06-04 | Discharge: 2010-06-04 | Disposition: A | Discharge status: Home / Self Care | Payer: Medicare Other | Source: Ambulatory Visit | Attending: Internal Medicine | Admitting: Internal Medicine

## 2010-06-04 ENCOUNTER — Ambulatory Visit (INDEPENDENT_AMBULATORY_CARE_PROVIDER_SITE_OTHER): Payer: Medicare Other | Admitting: Internal Medicine

## 2010-06-04 ENCOUNTER — Encounter: Payer: Self-pay | Admitting: Internal Medicine

## 2010-06-04 DIAGNOSIS — M549 Dorsalgia, unspecified: Secondary | ICD-10-CM

## 2010-06-04 DIAGNOSIS — E785 Hyperlipidemia, unspecified: Secondary | ICD-10-CM

## 2010-06-04 LAB — POCT URINALYSIS DIPSTICK
Bilirubin, UA: NEGATIVE
Blood, UA: NEGATIVE
Glucose, UA: NEGATIVE
Leukocytes, UA: NEGATIVE
Spec Grav, UA: 1.025
Urobilinogen, UA: 0.2

## 2010-06-04 MED ORDER — CYCLOBENZAPRINE HCL 5 MG PO TABS: 5.0000 mg | ORAL_TABLET | Freq: Three times a day (TID) | ORAL | Status: DC | PRN

## 2010-06-04 MED ORDER — DICLOFENAC SODIUM 50 MG PO TBEC: 50.0000 mg | DELAYED_RELEASE_TABLET | Freq: Two times a day (BID) | ORAL | Status: DC | PRN

## 2010-06-04 MED ORDER — ATORVASTATIN CALCIUM 40 MG PO TABS: ORAL_TABLET | ORAL | Status: DC

## 2010-06-04 NOTE — Progress Notes (Signed)
  Subjective:    Patient ID: [name], male    DOB: [birth year], 77 y.o.   MRN: [id number]  HPI Pt presents to clinic for evaluation of back pain. Notes 2 wk h/o right LBP without radiation, leg paresthesias, injury/trauma. Pain worse with position change. No alleviating factors. Taking no medication for the problem. Has known h/o kidney stones. States at onset of pain went to urologist for evaluation and underwent reportedly unremarkable CT scan. UA today reviewed without blood.     Review of Systems  Constitutional: Negative for fever and chills.  Musculoskeletal: Positive for back pain. Negative for arthralgias.       Objective:   Physical Exam  Constitutional: He appears well-developed and well-nourished.  HENT:  Head: Normocephalic and atraumatic.  Right Ear: External ear normal.  Left Ear: External ear normal.  Eyes: Conjunctivae are normal. No scleral icterus.  Musculoskeletal:       Back exam- No midline LS spine tenderness or bony abnormality. +bilateral right >left paraspinal muscle spasm and tenderness. Able to wt bear and ambulate without assistance.  Skin: Skin is dry.          Assessment & Plan:

## 2010-06-04 NOTE — Assessment & Plan Note (Signed)
Attempt low dose muscle relaxer prn and cautioned NW:[id number] sedating effect. Attempt short course of nsaid with food and no other nsaid (limited time only due to h/o PUD). Obtain LS xray. Followup closely if no improvement or worsening.

## 2010-06-05 ENCOUNTER — Telehealth: Payer: Self-pay

## 2010-06-05 NOTE — Telephone Encounter (Signed)
Pt aware.

## 2010-06-05 NOTE — Telephone Encounter (Signed)
Message copied by Kyung Rudd on Fri Jun 05, 2010 10:52 AM ------      Message from: Letitia Libra, Maisie Fus      Created: Thu Jun 04, 2010  9:00 PM       Back xray shows degenerative changes only. No fractures or other bone abnormalities

## 2010-06-12 ENCOUNTER — Other Ambulatory Visit: Payer: Self-pay | Admitting: Internal Medicine

## 2010-06-15 ENCOUNTER — Telehealth: Payer: Self-pay

## 2010-06-15 MED ORDER — CYCLOBENZAPRINE HCL 5 MG PO TABS: 5.0000 mg | ORAL_TABLET | Freq: Three times a day (TID) | ORAL | Status: DC | PRN

## 2010-06-15 NOTE — Telephone Encounter (Signed)
Flexeril refilled verbal order Dr Scotty Court

## 2010-06-15 NOTE — Telephone Encounter (Signed)
ok 

## 2010-06-24 ENCOUNTER — Telehealth: Payer: Self-pay | Admitting: Family Medicine

## 2010-06-24 ENCOUNTER — Ambulatory Visit (INDEPENDENT_AMBULATORY_CARE_PROVIDER_SITE_OTHER): Payer: Medicare Other | Admitting: Family Medicine

## 2010-06-24 ENCOUNTER — Encounter: Payer: Self-pay | Admitting: Family Medicine

## 2010-06-24 DIAGNOSIS — IMO0002 Reserved for concepts with insufficient information to code with codable children: Secondary | ICD-10-CM

## 2010-06-24 DIAGNOSIS — E559 Vitamin D deficiency, unspecified: Secondary | ICD-10-CM

## 2010-06-24 DIAGNOSIS — M545 Low back pain: Secondary | ICD-10-CM

## 2010-06-24 NOTE — Telephone Encounter (Signed)
ok 

## 2010-06-24 NOTE — Patient Instructions (Signed)
Get back on cyclobenzaprine 5 mg at night as needed for low back pain and spasm.

## 2010-06-24 NOTE — Progress Notes (Signed)
  Subjective:    Patient ID: [name], male    DOB: 1922-04-02, 77 y.o.   MRN: [id number]  HPI Patient seen with some recurrence right lower lumbar back spasm. Symptoms occur more at night and worse supine. Recently treated with low-dose cyclobenzaprine 5 mg which helped. He still has some medication at home. No radiculopathy symptoms. No injury. No incontinence. Denies numbness or weakness.  Patient relates some diffuse myalgias.  On Lipitor he states for about 30 years with no problem. In reviewing records has had very low vitamin D of 15 and only briefly took replacement but currently not on vitamin D replacement. No recent falls. Generally feels weak.   Review of Systems  Constitutional: Negative for fever, chills and appetite change.  HENT: Negative for neck stiffness.   Respiratory: Negative for cough and shortness of breath.   Cardiovascular: Negative for chest pain and leg swelling.  Gastrointestinal: Negative for abdominal pain.  Genitourinary: Negative for dysuria.  Musculoskeletal: Positive for back pain. Negative for gait problem.  Skin: Negative for rash.  Neurological: Positive for weakness. Negative for dizziness.  Hematological: Negative for adenopathy. Does not bruise/bleed easily.  Psychiatric/Behavioral: Negative for confusion.       Objective:   Physical Exam  Constitutional: He is oriented to person, place, and time. He appears well-developed and well-nourished.  HENT:  Head: Normocephalic and atraumatic.  Neck: Normal range of motion. No thyromegaly present.  Cardiovascular: Normal rate and regular rhythm.  Exam reveals no gallop.   Pulmonary/Chest: Effort normal and breath sounds normal. No respiratory distress. He has no wheezes. He has no rales.  Abdominal: There is no tenderness.  Musculoskeletal: He exhibits no edema.       Straight leg raise is negative bilaterally. No lower extremity edema. Good distal pulses in both feet  Lymphadenopathy:    He  has no cervical adenopathy.  Neurological: He is alert and oriented to person, place, and time.       Full-strength with plantar flexion dorsiflexion in both feet. Sensory function intact to touch. Deep tender reflexes 2+ knee and ankle bilateral  Skin: No rash noted.  Psychiatric: He has a normal mood and affect.          Assessment & Plan:  #1 recurrent low back pain. Probable muscle spasm. Continue low-dose cyclobenzaprine with caution about potential for constipation and dry mouth. Recent lumbar x-rays revealed degenerative changes and no acute finding.  #2 leg pains. Differential could include statin but on statin for several years. Possibly related to very low vitamin D. Reassess 25-hydroxy vitamin D level. If low replace

## 2010-06-24 NOTE — Telephone Encounter (Signed)
Pt called and is req to change doctors from Dr Scotty Court to Dr Rodena Medin, due to availabilty issues. Pls advise if ok to change doctors?

## 2010-06-25 ENCOUNTER — Other Ambulatory Visit: Payer: Self-pay | Admitting: Internal Medicine

## 2010-06-25 LAB — VITAMIN D 25 HYDROXY (VIT D DEFICIENCY, FRACTURES): Vit D, 25-Hydroxy: 26 ng/mL — ABNORMAL LOW (ref 30–89)

## 2010-06-25 MED ORDER — ERGOCALCIFEROL 1.25 MG (50000 UT) PO CAPS: 50000.0000 [IU] | ORAL_CAPSULE | ORAL | Status: DC

## 2010-06-25 NOTE — Telephone Encounter (Signed)
Pt informed and he voiced his understanding. 

## 2010-07-08 ENCOUNTER — Other Ambulatory Visit: Payer: Self-pay | Admitting: Cardiology

## 2010-07-08 NOTE — Telephone Encounter (Signed)
OK to change to Dr. Rodena Medin

## 2010-07-08 NOTE — Telephone Encounter (Signed)
Ok tochange to Dr. Rodena Medin

## 2010-07-08 NOTE — Telephone Encounter (Signed)
okto change to Dr. Rodena Medin

## 2010-07-08 NOTE — Telephone Encounter (Signed)
Is change of pcp ok with you Dr Scotty Court? Pls advise.

## 2010-07-09 ENCOUNTER — Encounter: Payer: Self-pay | Admitting: Internal Medicine

## 2010-07-09 ENCOUNTER — Ambulatory Visit (INDEPENDENT_AMBULATORY_CARE_PROVIDER_SITE_OTHER): Payer: Medicare Other | Admitting: Internal Medicine

## 2010-07-09 DIAGNOSIS — IMO0001 Reserved for inherently not codable concepts without codable children: Secondary | ICD-10-CM

## 2010-07-09 DIAGNOSIS — M2550 Pain in unspecified joint: Secondary | ICD-10-CM

## 2010-07-09 DIAGNOSIS — J029 Acute pharyngitis, unspecified: Secondary | ICD-10-CM

## 2010-07-09 DIAGNOSIS — E538 Deficiency of other specified B group vitamins: Secondary | ICD-10-CM

## 2010-07-09 DIAGNOSIS — R5383 Other fatigue: Secondary | ICD-10-CM

## 2010-07-09 DIAGNOSIS — M791 Myalgia, unspecified site: Secondary | ICD-10-CM

## 2010-07-09 LAB — SEDIMENTATION RATE: Sed Rate: 20 mm/hr (ref 0–22)

## 2010-07-09 LAB — VITAMIN B12: Vitamin B-12: 211 pg/mL (ref 211–911)

## 2010-07-09 NOTE — Telephone Encounter (Signed)
Pt has been notified that both doctors have agreed to pcp change.

## 2010-07-09 NOTE — Assessment & Plan Note (Signed)
Patient denies relationship to statin. Obtain CK enzyme

## 2010-07-09 NOTE — Assessment & Plan Note (Signed)
Suspicious for inflammatory arthritis. Improved with prednisone. Obtain ESR, rheumatoid factor and ANA. Consider rheumatology consult pending these results

## 2010-07-09 NOTE — Assessment & Plan Note (Signed)
Obtain B12 and TSH

## 2010-07-09 NOTE — Progress Notes (Signed)
  Subjective:    Patient ID: [name], male    DOB: 03/30/1922, 77 y.o.   MRN: [id number]  HPI patient presents to clinic for evaluation of arthralgia, fatigue and myalgias. States has recently had episode of left hand pain swelling erythema and warmth. Primarily involved fifth MCP. Solids orthopedic physician and received a steroid injection without improvement. With subsequent swelling and inflammatory changes orthopedic place patient on steroid taper and is on the last day today. He was also placed on indomethacin which he tolerates without abdominal burning or abdominal pain. No other joints are involved. Has had no previous attacks. Also complains of chronic intermittent myalgias involving [place] the muscles as well as thighs. Patient does not feel is related to Lipitor. Also has one-month history of intermittent sore throat without fever chills or difficulty swallowing. Has generalized chronic fatigue. No other complaints.  Reviewed pmh, medications and allergies   Review of Systems see history of present illness     Objective:   Physical Exam  Vitals reviewed. Constitutional: He appears well-developed and well-nourished. No distress.  HENT:  Head: Normocephalic and atraumatic.  Nose: Nose normal.  Mouth/Throat: Oropharynx is clear and moist. No oropharyngeal exudate.  Eyes: Conjunctivae are normal. No scleral icterus.  Neck: Neck supple.  Musculoskeletal:       Mild thickening of the MCP and DIP joints. No erythema warmth or effusion. Mildly limited flexion of left fingers. Nontender MCPs and wrists. No nodules noted.  Neurological: He is alert.  Skin: Skin is warm and dry. He is not diaphoretic. No erythema.  Psychiatric: He has a normal mood and affect.          Assessment & Plan:

## 2010-07-10 LAB — ANA: Anti Nuclear Antibody(ANA): NEGATIVE

## 2010-07-15 ENCOUNTER — Other Ambulatory Visit: Payer: Self-pay | Admitting: Internal Medicine

## 2010-07-15 ENCOUNTER — Telehealth: Payer: Self-pay

## 2010-07-15 DIAGNOSIS — M25549 Pain in joints of unspecified hand: Secondary | ICD-10-CM

## 2010-07-15 NOTE — Progress Notes (Signed)
Pt aware.

## 2010-07-15 NOTE — Telephone Encounter (Signed)
Message copied by Kyung Rudd on Wed Jul 15, 2010  1:13 PM ------      Message from: Letitia Libra, Maisie Fus      Created: Tue Jul 14, 2010  7:47 PM       Labs nl except b12 mildly low. Recommend otc b12 po qd. Also if hand swelling/pain continues would recommend rheumatology consult as discussed

## 2010-07-15 NOTE — Telephone Encounter (Signed)
Pt aware and verbalized understanding. Pt is requesting the rheumatology referral.

## 2010-07-21 ENCOUNTER — Other Ambulatory Visit: Payer: Self-pay | Admitting: Gastroenterology

## 2010-08-11 NOTE — Op Note (Signed)
NAME:  [name], [name]                ACCOUNT NO.:  [id number]   MEDICAL RECORD NO.:  [id number]          PATIENT TYPE:  INP   LOCATION:  1610                         FACILITY:  [facility]   PHYSICIAN:  Ollen Gross, M.D.    DATE OF BIRTH:  1922-12-29   DATE OF PROCEDURE:  04/26/2007  DATE OF DISCHARGE:                               OPERATIVE REPORT   PREOPERATIVE DIAGNOSIS:  Right femoral neck fracture.   POSTOPERATIVE DIAGNOSIS:  Right femoral neck fracture.   PROCEDURE:  Right hip pinning.   SURGEON:  Ollen Gross, M.D., no assistant.   ANESTHESIA:  General.   ESTIMATED BLOOD LOSS:  Minimal.   DRAINS:  None.   COMPLICATIONS:  None.   CONDITION:  Stable to recovery.   BRIEF CLINICAL NOTE:  [name] is an 77 year old male had a fall at  home two days ago sustaining a nondisplaced right femoral neck fracture.  He presents now for in situ pinning of the right hip.   PROCEDURE IN DETAIL:  After successful administration of general  anesthetic, the patient was placed on the fracture table with his right  foot in a well-padded traction boot, left lower extremity well-padded  leg holder.  Fluoro confirms that the fracture remained nondisplaced.  His thigh is then prepped and draped in the usual sterile fashion.  A  guide pin is passed over the thigh to gain our angle of inclination for  the pinning.  Incision is made based on this over the lateral thigh  about a two inch incision.  Skin cut with a 10 blade through  subcutaneous tissue to the fascia lata which was incised in line with  the skin incision.  Guide pin is passed so as to enter the lateral  cortex of the femur and be slightly posterior on the lateral and central  on the AP.  Two other pins were placed, one inferior to this, one  anterior to this.  The lengths are 100 mm, 90 mm and 90 mm respectively.  The 65 Ace cannulated screws were then passed over the guide pin and is  excellent purchase with the screws.  The  threads all crossed the  fracture line.  The guide pin is then removed.  AP views and lateral  fluoro views taken and hardware was in good position.  The wound is then  copiously irrigated with saline solution and the fascia lata closed with  interrupted 2-0 Vicryl subcu  interrupted 2-0 Vicryl subcuticular running 4-0 Monocryl.  The incision  is cleaned and dried and Steri-Strips and sterile dressing applied.  He  is then taken off the fracture table, placed on the hospital bed,  awakened and transferred to recovery in stable condition.      Ollen Gross, M.D.  Electronically Signed     FA/MEDQ  D:  04/26/2007  T:  04/27/2007  Job:  [id number]

## 2010-08-11 NOTE — Assessment & Plan Note (Signed)
[facility]                            CARDIOLOGY OFFICE NOTE   NAME:Burgess, Adam SEIDMAN                       MRN:          [id number]  DATE:07/25/2007                            DOB:          1922-12-21    PRIMARY:  Dr. Scotty Court.   REASON FOR PRESENTATION:  Evaluate patient with chest pain.   HISTORY OF PRESENT ILLNESS:  The patient is a pleasant 77 year old  gentleman with coronary disease as described below.  Since I last saw  him, he was in [place].  He had some chest pain there and had a treadmill  test; this was negative for any evidence of ischemia.  This was a plain  old exercise treadmill (POET).  At that time, he was having some chest  discomfort.  He said that he did well until last couple of days.  He  noted chest discomfort while in the shower yesterday.  He actually took  a sublingual nitroglycerin.  He had the same pain again this morning.  He said it is a substernal discomfort.  It is moderate to severe in  intensity.  It lasted 3-4 minutes.  It did not radiate to his neck or to  his arms.  There was no associated nausea, vomiting or diaphoresis.  He  does not think it was his GI pain.  He cannot remember whether it was  similar to his previous cardiac complaints.   Of note, he broke his hip last year and is getting around okay with a  cane for emotional support.   PAST MEDICAL HISTORY:  1. Coronary artery disease (PTCA and stenting of the proximal LAD with      75% stenosis reduced to 0%; this was in 2006).  2. Peptic ulcer disease.  3. Transient ischemic attack in 1990 with left-sided clumsiness.  4. Anxiety and stress.  5. Radicular pain.  6. Cholelithiasis.  7. Dyslipidemia.   ALLERGIES:  PENICILLIN.  He is intolerant of CRESTOR.   MEDICATIONS:  1. Aspirin 81 mg daily.  2. Nexium 40 mg daily.  3. Metoprolol 25 mg a day.  4. Lipitor 40 mg daily.  5. Multivitamin.   REVIEW OF SYSTEMS:  As stated in HPI and otherwise  negative for other  systems.   PHYSICAL EXAMINATION:  The patient is in no distress.  Blood pressure 133/57, heart rate 78 and regular, weight 163 pounds,  body mass index 22.5.  HEENT:  Eyes are unremarkable; pupils equal, round and reactive to  light; fundi not visualized.  Oral mucosa normal.  NECK:  No jugular distention at 45 degrees.  Carotid upstroke brisk and  symmetrical.  No bruits.  No thyromegaly.  LYMPHATICS:  No adenopathy.  LUNGS:  Clear to auscultation bilaterally.  BACK:  No costovertebral angle tenderness.  CHEST:  Unremarkable.  HEART:  PMI not displaced or sustained, S1 and S2 within normal, no S3,  no S4, no clicks, no rubs, no murmurs.  ABDOMEN:  Flat; positive bowel  sounds, normal in frequency and pitch; no bruits, no rebound, no  guarding, no midline pulsatile mass, no organomegaly.  SKIN:  No rash, no nodules.  EXTREMITIES:  Pulse 2+, no edema.   EKG:  Sinus rhythm with premature atrial contractions and a bigeminal  pattern.  Axis within normal.  Intervals within normal.  No acute ST-T  wave changes.   ASSESSMENT AND PLAN:  1. Chest discomfort:  The patient's chest discomfort today and      yesterday was worrisome.  He had the coronary disease as described.      Given the above, he needs a stress perfusion study as the pretest      probability is at least moderately high for recurrent obstructive      coronary disease.  He says he thinks he will be able to walk on a      treadmill and we will try this; if not, he can be converted to an      adenosine Cardiolite.  2. Dyslipidemia:  We will make sure he has a recent lipid profile; if      not, we will have it drawn when he comes back for his stress test.  3. Transient ischemic attack:  He is remaining on aspirin.  4. Followup:  I will see him back in 6 months or sooner if needed.     Rollene Rotunda, MD, [facility]  Electronically Signed    JH/MedQ  DD: 07/25/2007  DT: 07/25/2007  Job #: [id number]   cc:    Ellin Saba., MD

## 2010-08-11 NOTE — Assessment & Plan Note (Signed)
[facility]                            CARDIOLOGY OFFICE NOTE   NAME:[name], [name]                       MRN:          [id number]  DATE:01/25/2008                            DOB:          03-May-1922    PRIMARY CARE PHYSICIAN:  Tawny Asal, MD.   REASON FOR PRESENTATION:  Evaluate the patient with coronary artery  disease.   HISTORY OF PRESENT ILLNESS:  The patient presents for evaluation as in  the above.  Since I last saw him, he had to come off Advicor because of  its side effects.  He has had his lipids rechecked.  His HDL was down a  little bit.  He is not having any chest discomfort.  He has been  exercising routinely.  He is active in his yard.  With this level of  activity, he denies any chest discomfort, neck, or arm discomfort.  He  has had no palpitations, presyncope, or syncope.  He has had no PND or  orthopnea.  He has had no shortness of breath.  He does get some leg  tiredness.  Of note, the patient did have a salivary stone that had to  be surgically removed since I last saw him.   Of note, his stress perfusion study in May 2009 demonstrating no  evidence of ischemia infarct.  His EF is 65%.   PAST MEDICAL HISTORY:  Coronary artery disease (PTCA and stenting of the  proximal LAD with 75% stenosis reduced to 0%.  This was in 2006), peptic  ulcer disease, transient ischemic attack in 1990 with left-sided  clumsiness, anxiety and stress, radicular pain, cholelithiasis,  dyslipidemia, and salivary stone removed.   ALLERGIES:  PENICILLIN.  He has also been intolerant to CRESTOR.   INDICATIONS:  1. Aspirin 81 mg daily.  2. Nexium 40 mg daily.  3. Metoprolol 25 mg daily.  4. Lipitor 40 mg daily.  5. Multivitamin.   REVIEW OF SYSTEMS:  As stated in the HPI and otherwise negative for  other systems.   PHYSICAL EXAMINATION:  GENERAL:  The patient is in no distress.  VITAL SIGNS:  Blood pressure is 129/63, heart rate 73 and  regular,  weight 165 pounds, and body mass index is 22.  HEENT:  Eyes are unremarkable, pupils are equal, round, and reactive to  light, fundi not visualized, oral mucosa unremarkable.  NECK:  No jugular venous distention at 45 degrees, carotid upstroke  brisk and symmetric, no bruits, no thyromegaly.  LYMPHATICS:  No cervical, axillary, or inguinal adenopathy.  LUNGS:  Clear to auscultation bilaterally.  BACK:  No costovertebral angle tenderness.  CHEST:  Unremarkable.  HEART:  PMI not displaced or sustained, S1 and S2 within normal limits.  No S3, no S4, no clicks, no rubs, or murmurs.  ABDOMEN:  Flat, positive bowel sounds.  Normal in frequency and pitch,  no bruits, no rebound, no guarding or midline pulsatile mass.  No  hepatomegaly or splenomegaly.  SKIN:  No rashes, no nodules.  EXTREMITIES:  Pulses are 2+ throughout, no edema, no cyanosis or  clubbing.  NEURO:  Oriented to person, place, and time, cranial nerves II-XII  grossly intact, motor grossly intact.   EKG; sinus rhythm, rate 73, axis within normal limits, intervals within  normals limits, and no acute ST-wave change.   ASSESSMENT AND PLAN:  1. Coronary artery disease.  The patient is having no ongoing      symptoms.  He had a negative stress perfusion study.  No further      cardiovascular testing is suggested.  He will continue with the      risk reduction.  2. Dyslipidemia.  With his low HDL, I would like to try to raise this      and I will start fenofibrate in addition to his Lipitor.  We will      get a lipid and liver panel in 8 weeks.  He is going to keep up his      exercise.  3. Transient ischemic attack.  He remains on the aspirin.  He has had      no further symptoms.  4. Anxiety and stress.  He does seem to be still somewhat stress, but      this is under the care of Dr. Scotty Court.  5. Followup.  I will see him back in 6 months or sooner if needed.     Rollene Rotunda, MD, [facility]  Electronically  Signed    JH/MedQ  DD: 01/25/2008  DT: 01/25/2008  Job #: [id number]   cc:   Ellin Saba., MD

## 2010-08-11 NOTE — Assessment & Plan Note (Signed)
[facility]                            CARDIOLOGY OFFICE NOTE   NAME:[name]                       MRN:          [id number]  DATE:02/28/2008                            DOB:          [birth year]    PRIMARY CARE PHYSICIAN:  Tawny Asal, MD   REASON FOR PRESENTATION:  Evaluate patient with chest discomfort.   HISTORY OF PRESENT ILLNESS:  The patient returns for followup of chest  discomfort.  He describes chest discomfort that is similar to symptoms  he had in April when he had a negative stress perfusion study.  What is  different about this is that he has also had some persistent chest  tightness.  This goes on for days at a time.  It is kind of a numb  feeling in his chest.  He does not describe chest pressure in the  classic sense, no neck or arm discomfort.  He does not have any  palpitation, presyncope, or syncope.  He describes having to take deep  breaths and not feeling like he can get a deep breath.  His wife is in  the room with him.  She says he is not eating well and that he eats lot  of ice cream and other foods that she does not think he should get.  She  also says that he is not sleeping well.  She thinks he is anxious and  depressed.  Saw Dr. Scotty Court and was referred here.   PAST MEDICAL HISTORY:  1. Coronary artery disease (PTCA and stenting of proximal LAD with 75%      stenosis reduced to 0%.  This was in 2006.  Stress perfusion study      in April 2009 demonstrated well-preserved ejection fraction with no      evidence of ischemia or infarct).  2. Peptic ulcer disease.  3. Transient ischemic attacks in 1990 with left-sided clumsiness.  4. Anxiety and stress.  5. Radicular pain.  6. Cholelithiasis.  7. Dyslipidemia.  8. Salivary stone removed.   ALLERGIES:  PENICILLIN.  He has also been intolerant to CRESTOR.   MEDICATIONS:  1. Aspirin 81 mg daily.  2. Nexium 40 mg daily.  3. Metoprolol 25 mg daily.  4.  Lipitor 40 mg daily.  5. Multivitamin.  6. PreserVision.  7. Fenofibrate 54 mg daily.  8. Lexapro 10 mg daily.   REVIEW OF SYSTEMS:  As stated in the HPI and otherwise positive for  dizziness and lightheadedness and some difficulty with gait (he recently  had an MRI showing small vessel disease).  Negative for other systems.   PHYSICAL EXAMINATION:  GENERAL:  The patient is in no distress.  VITAL SIGNS:  Blood pressure 140/78, heart rate 70 and regular, weight  169 pounds, body mass index 22.  HEENT:  Eyes unremarkable.  Pupils equal, round, and reactive to light.  Fundi not visualized.  Oral mucosa unremarkable.  NECK:  No jugular venous distention.  Carotid upstrokes brisk and  symmetric.  No bruits.  No thyromegaly.  LYMPHATICS:  No cervical, axillary, or inguinal adenopathy.  LUNGS:  Clear to auscultation bilaterally.  BACK:  No costovertebral angle tenderness.  CHEST:  Unremarkable.  HEART:  PMI not displaced or sustained.  S1 and S2 within normal limits.  No S3.  No S4.  No clicks, no rubs, no murmurs.  ABDOMEN:  Flat.  Positive bowel sounds, normal in frequency and pitch.  No bruits, no rebound, no guarding, no midline pulsatile mass.  No  hepatomegaly, no splenomegaly.  SKIN:  No rashes, no nodules.  EXTREMITIES:  2+ pulses throughout.  No edema, no cyanosis, no clubbing.  NEUROLOGIC:  Oriented to person, place, and time.  Cranial nerves II  through XII grossly intact.  Motor grossly intact.   ASSESSMENT/PLAN:  1. Chest:  The patient's chest is atypical.  I do not believe that      this has an anginal etiology.  He had a negative stress perfusion      study earlier this year.  At this point, I would not suggest      further cardiovascular testing.  I am not clear what the etiology      might be.  He does have anxiety and stress and his wife thinks this      could be a component.  2. Fatigue, he describes this.  I am going to go ahead and check a TSH      and vitamin D  level.  I will also send a note to Dr. Scotty Court      asking whether he could be depressed.  3. Dyslipidemia.  We are managing him with fenofibrate and Lipitor.      He has had a low HDL.  He did not tolerate Advicor.  We will follow      this up with fasting lipid profile.  4. Followup.  I would like to see him back in about 4 months or sooner      if needed.     Rollene Rotunda, MD, [facility]  Electronically Signed    JH/MedQ  DD: 02/28/2008  DT: 02/29/2008  Job #: [id number]   cc:   Ellin Saba., MD

## 2010-08-11 NOTE — Op Note (Signed)
NAME:  [name], [name]                ACCOUNT NO.:  [id number]   MEDICAL RECORD NO.:  [id number]          PATIENT TYPE:  OIB   LOCATION:  5155                         FACILITY:  [facility]   PHYSICIAN:  Suzanna Obey, M.D.       DATE OF BIRTH:  1922/09/23   DATE OF PROCEDURE:  11/30/2007  DATE OF DISCHARGE:                               OPERATIVE REPORT   PREOPERATIVE DIAGNOSIS:  Right submandibular mass.   POSTOPERATIVE DIAGNOSIS:  Right submandibular mass.   SURGICAL PROCEDURES:  Excision of right submandibular gland with stone.   ANESTHESIA:  General.   ESTIMATED BLOOD LOSS:  Approximately 10 mL.   INDICATIONS:  An 77 year old with significant pain in the right  submandibular gland area.  He was treated with antibiotics and failed to  resolve.  The CT scan showed a significant stone in the hilum of the  gland.  He is informed of the risks and benefits of the procedure and  options were discussed.  All questions were answered and consent was  obtained.   OPERATION:  The patient was taken to the operating room, placed in  supine position.  After general endotracheal tube anesthesia, he was  placed in the left gaze position.  An incision was made in the crease  low in his neck and dissection down to the platysma muscle.  The  platysma muscle was divided and the capsule of the inferior aspect of  the gland was identified.  The vein was clamped and divided and brought  up to protect the marginal mandibular nerve and dissection was carried  out right along the capsule.  The capsule was very scarred and the whole  gland was extremely scarred.  Dissection was taken posterior where the  hypoglossal nerve was identified and dissection up to the mylohyoid  muscle was identified, retracted, and the duct of the gland was  identified.  The stone extruded with the dissection and was sent to  pathology.  Whole area up around the lingual nerve was very scarred.  Careful layer by layer dissection was  created, but there was a  significant desmoplastic reaction all around the area.  It was not  dissected up into the area of the nerve as it was very difficult to  identify the tissues so the dissection stayed right on the capsule of  the gland, removing the gland off the submandibular plexus.  The gland  was then sent for pathology.  The wound was irrigated with saline and  was good hemostasis and was closed with interrupted 4-0 chromic and  running 5-0 nylon.  The patient was awakened, brought to recovery in  stable condition.  Counts correct.           ______________________________  Suzanna Obey, M.D.     JB/MEDQ  D:  11/30/2007  T:  12/01/2007  Job:  [id number]

## 2010-08-11 NOTE — H&P (Signed)
NAME:  [name], [name]                ACCOUNT NO.:  [id number]   MEDICAL RECORD NO.:  [id number]          PATIENT TYPE:  INP   LOCATION:  1610                         FACILITY:  [facility]   PHYSICIAN:  Ollen Gross, M.D.    DATE OF BIRTH:  26-Dec-1922   DATE OF ADMISSION:  04/24/2007  DATE OF DISCHARGE:                              HISTORY & PHYSICAL   CHIEF COMPLAINT:  Right hip pain.   HISTORY OF PRESENT ILLNESS:  The patient is an 77 year old male who came  in to [facility] emergency apartment late last night or early morning  hours due to a fall yesterday sustained at home.  He states he was  sitting down working on his taxes and fell asleep. He woke up, stood up,  got a little dizzy and fell. He has been treated for mastoiditis for  dizziness ongoing, so this is not a new issue, but he, unfortunately,  lost his balance and fell, hurting his right hip. Continued to have  pain, so he came into the emergency room where x-rays showed an impacted  femoral neck fracture. Dr. Homero Fellers Aluisio was on call for [facility].  The patient was seen and admitted for surgical intervention.   ALLERGIES:  PENICILLIN caused a rash.   CURRENT MEDICATIONS:  Lipitor, metoprolol, Nexium.  He takes an  injection every 3-4 months for eyes.   PAST MEDICAL HISTORY:  1. Mild coronary arterial disease.  2. Peptic ulcer disease.  3. History of TIA in 1990.  4. Gastroesophageal reflux disease.  5. History of renal calculi.  6. History of anxiety.  7. Diverticulosis.  8. Internal hemorrhoids.  9. Macular degeneration.  10.Mastoiditis.   PAST SURGICAL HISTORY:  1. Cholecystectomy.  2. Right inguinal hernia repair.  3. Multiple colonoscopies with previous polypectomies.  4. Cardiac catheterization in February 2005 and again in February      2006.  5. Left eye cataract surgery.   SOCIAL HISTORY:  Married, nonsmoker.  No alcohol.   FAMILY HISTORY:  Noncontributory.   REVIEW OF SYSTEMS:  GENERAL:  No  fevers, chills, night sweats.  NEUROLOGIC:  He has had a little bit of dizziness but has been treated  for mastoiditis to which he attributes the dizziness.  No seizure or  syncope.  RESPIRATORY:  No shortness of breath, productive cough,  hemoptysis.  CARDIOVASCULAR:  No chest pain, angina, orthopnea. GI:  No  nausea, vomiting, diarrhea, or constipation.  Does have reflux.  GU:  No  dysuria, hematuria, or discharge.  MUSCULOSKELETAL:  Right hip.   PHYSICAL EXAMINATION:  VITAL SIGNS:  Temperature 99.8, pulse 78,  respiration 18, blood pressure 145/75.  GENERAL:  An 77 year old white male, tall, slender frame, no acute  distress.  He is alert, oriented and cooperative, excellent historian.  He is accompanied by his wife.  HEENT: Normocephalic, atraumatic.  Pupils round and reactive.  Oropharynx clear.  EOMs intact.  Does have upper full dentures and  partial lower denture.  CHEST:  Clear anterior posterior chest walls.  No rhonchi, rales,  wheezing.  HEART:  Regular rate and rhythm.  No murmur, S1-S2 noted.  ABDOMEN:  Soft, slightly round.  Bowel sounds present  RECTAL, BREASTS, GENITALIA:  Not done, not pertinent to present illness.  EXTREMITIES:  Right hip:  Right hip slightly flexed, externally rotated  from position of comfort.  Motor function is intact, moves toes well on  exam.  Sensation is intact.  Pain with rotation.   IMPRESSION:  1. Right femoral neck fracture.  2. Mild coronary arterial disease.  3. Peptic ulcer disease.  4. History of transient ischemic attack in 1990.  5. Gastroesophageal reflux disease.  6. History of renal calculi.  7. History of anxiety.  8. Diverticulosis.  9. Internal hemorrhoids.  10.Macular degeneration.  11.Mastoiditis   PLAN:  The patient will be admitted to [facility] to undergo a  right hip pinning. Surgery will be performed by Dr. Ollen Gross.      Alexzandrew L. Perkins, P.A.C.      Ollen Gross, M.D.   Electronically Signed    ALP/MEDQ  D:  04/25/2007  T:  04/25/2007  Job:  [id number]   cc:   Ellin Saba., MD  [address] [place]  [place] [address]   E. Graceann Congress, MD, [facility]  [address]  [address]  [place], [place]  [address]

## 2010-08-14 NOTE — Op Note (Signed)
[facility]  Patient:    [name], [name]                       MRN: [id number] Proc. Date: 08/14/99 Adm. Date:  [id number] Disc. Date: [id number] Attending:  Meredith Leeds                           Operative Report  PREOPERATIVE DIAGNOSIS:  Symptomatic gallstones.  POSTOPERATIVE DIAGNOSIS:  Symptomatic gallstones.  OPERATION PERFORMED:  Laparoscopic cholecystectomy.  SURGEON:  Zigmund Daniel, M.D.  ASSISTANT:  Velora Heckler, M.D.  ANESTHESIA:  General.  INDICATIONS FOR PROCEDURE:  After adequate monitoring and general anesthesia and routine preparation and draping of the abdomen, I made a short transverse incision just below the umbilicus, opened the fascia, longitudinally, opened the peritoneum bluntly, placed a 0 Vicryl pursestring suture in the fascia, secured a Hasson cannula and inflated the abdomen with CO2.  Peritoneoscopy showed no abnormalities except for mild acute and chronic inflammation of the gallbladder.  I put in three additional ports, positioned the patient head up, foot down, tilted to the left and elevated the fundus of the gallbladder over the liver.  I took down a few adhesions and grasped and retracted the infundibulum of the gallbladder to the right.  I found that the patient had what appeared to be a very long cystic duct and well defined cystic duct and cystic artery.  I clipped each of those structures with three clips and cut between the two closer to the gallbladder.  I found one additional structure which appeared to be an auxiliary cystic artery going to the posterior aspect of the gallbladder and I clipped and divided it similarly.  I then dissected the gallbladder from the liver using the cautery instruments and removed it intact with excellent hemostasis.  There was no evidence of drainage of bile. I removed the gallbladder from the body through the umbilical incision and tied the pursestring.  I  anesthetized all incisions, removed CO2, removed ports and closed the skin with intracuticular 4-0 Vicryl and Steri-Strips. The patient tolerated the procedure very well.  DESCRIPTION OF PROCEDURE: DD:  08/14/99 TD:  08/18/99 Job: [id number]

## 2010-08-14 NOTE — Cardiovascular Report (Signed)
NAME:  [name], [name]                          ACCOUNT NO.:  [id number]   MEDICAL RECORD NO.:  [id number]                   PATIENT TYPE:  INP   LOCATION:  1827                                 FACILITY:  [facility]   PHYSICIAN:  Jonelle Sidle, M.D. [facility]        DATE OF BIRTH:  [birth year]   DATE OF PROCEDURE:  DATE OF DISCHARGE:                              CARDIAC CATHETERIZATION   PRIMARY CARE PHYSICIAN:  Tawny Asal, M.D.   PRIMARY CARDIOLOGIST:  Cecil Cranker, M.D.   PROCEDURES PERFORMED:  1. Left heart catheterization.  2. Selective coronary angiography.  3. Left ventriculography.   CARDIOLOGIST:  Jonelle Sidle, M.D.   INDICATIONS:  Mr. Anacker is a 77 year old gentleman with an apparent  history of gastroesophageal reflux disease and peptic ulcer disease, with a  reportedly normal cardiac catheterization in 1987, and a TIA in 1980 who is  now admitted with a recent history of recurrent chest pressure.  His initial  cardiac markers were negative.  He is referred for cardiac catheterization  for definitive evaluation of his coronary anatomy.   ACCESS AND EQUIPMENT:  The area about the right femoral artery was  anesthetized with 1% lidocaine and a 6 French sheath was placed in the right  femoral artery via the modified Seldinger technique.  Standard preformed 6  [place] JL-4 and JR-4 catheters were used for selective coronary angiography,  and an angled pigtail catheter was used for left heart catheterization and  left ventriculography.  All exchanges were made over a wire.   The patient tolerated the procedure well without immediate complications.   HEMODYNAMIC DATA:  Left ventricle 122/15.  Aorta 117/65.   ANGIOGRAPHIC DATA:  1. Left Main Coronary Artery:  The left main coronary artery has     approximately 20% stenosis at the ostium.  This could be an anatomical     variant with a somewhat cloacal opening.   1. Left Anterior Descending:  The left  anterior descending is a medium-to-     large caliber vessel with three diagonal branches.  There is a 50%     proximal stenosis and a 20% more diffuse distal stenosis.  Flow is TIMI     III within this vessel.   1. Circumflex Coronary Artery:  The circumflex coronary artery is a large     vessel that is codominant with five obtuse marginal branches.  There are     minor luminal irregularities without significant flow-limiting stenosis     noted.   1. Right Coronary Artery:  The right coronary artery is small with a small     posterior descending branch, codominant with the circumflex system.     There are minor luminal irregularities without significant flow-limiting     stenosis.   VENTRICULOGRAPHIC DATA:  Left Ventriculography:  Left ventriculography was  performed in the RAO projection revealing an ejection fraction of  approximately 65% with no focal wall  motion abnormalities and trace mitral  regurgitation in the setting of ventricular ectopy.   DIAGNOSES:  1. Mild coronary atherosclerosis with the most significant lesion being a     50% proximal left anterior descending stenosis.  Coronary flow is TIMI     III in the left anterior descending.  2. Left ventricular ejection fraction of approximately 65% with trace mitral     regurgitation in the setting of ventricular ectopy.   RECOMMENDATIONS:  I reviewed the films with Dr. Antoine Poche.  At this point  would observe the patient and continue to cycle cardiac markers as this  could have been a transient plaque rupture event in the left anterior  descending.  No clear indication for revascularization at this time.  I  would otherwise suggest aggressive risk factor modification and follow up  Cardiolite.                                               Jonelle Sidle, M.D. [facility]    SGM/MEDQ  D:  05/01/2003  T:  05/02/2003  Job:  [id number]

## 2010-08-14 NOTE — Discharge Summary (Signed)
NAME:  [name], [name]                          ACCOUNT NO.:  [id number]   MEDICAL RECORD NO.:  [id number]                   PATIENT TYPE:  INP   LOCATION:  3740                                 FACILITY:  [facility]   PHYSICIAN:  Rollene Rotunda, M.D.                DATE OF BIRTH:  1922/07/21   DATE OF ADMISSION:  05/01/2003  DATE OF DISCHARGE:  05/02/2003                                 DISCHARGE SUMMARY   BRIEF HISTORY:  This is a 77 year old male who had a previous  catheterization performed by Dr. Graceann Congress in 1987 that demonstrated  normal coronary anatomy.  The patient was admitted to [facility] on  May 01, 2003 by Dr. Antoine Poche for evaluation of chest pain.   PAST MEDICAL HISTORY:  1. Peptic ulcer disease.  2. History of transient ischemic attacks in 1990.  3. History of gastroesophageal reflux disease.  4. History of anxiety and stress.  5. History of radicular pain in his left lower extremity secondary to     vertebral bone spurs.  6. Status post laparoscopic cholecystectomy in May of 2001.  7. Status post right inguinal hernia repair.  8. History of colonoscopy every three years with previous benign polyps     being resected at colonoscopy.   SOCIAL HISTORY:  The patient lives in [place] with his wife.  He has one  daughter who was recently diagnosed with hepatitis C.  He is a retired  Armed forces operational officer.  He is a refugee from [place].  He does not smoke  cigarettes; he quit in 1973.  He uses alcohol occasionally.  He does not use  recreational drugs.   FAMILY HISTORY:  His mother died from pancreatic carcinoma.  She had no  history of coronary artery disease. His father died at age 20 from a MI.  He  has a sister living at age 17 with heart trouble.   HOSPITAL COURSE:  As noted, this patient was admitted to [facility]  on May 01, 2003 for evaluation of chest pain.  He underwent cardiac  catheterization that same day performed by Dr.  Diona Browner; please see his  dictated report for full details.  The patient was found to have a 50% mid  LAD with a 20% mid to distal LAD and a 20% proximal LAD with no other  significant lesions.  The ejection fraction was noted to be 65% with no wall  motion abnormalities.  A decision was made to treat the patient medically  and he was discharged the following day in improved and stable condition.  Dr. Antoine Poche recommended a Cardiolite in approximately one year or if he had  recurrent symptoms.   LABORATORY DATA:  Please see results of cardiac catheterization as noted  above.  A CBC on the day of discharge revealed a hemoglobin of 15.4, a  hematocrit of 46.2, WBC of 8,000 and platelets 176,000.  Chemistry profile  on the day of discharge revealed a BUN of 17, a creatinine of 1.3, a  potassium of 3.7 and a glucose of 67.  Cardiac enzymes were negative.  A  lipid profile is currently pending.  A chest x-ray showed no active disease.  TSH was 2.112.   DISCHARGE MEDICATIONS:  1. Toprol XL 25 mg daily.  2. Enteric coated aspirin 81 mg daily.  3. Protonix as previously taken.  4. Nitroglycerin to be used p.r.n.  5. Lipitor 40 mg daily.   ACTIVITY:  The patient was told to avoid any strenuous activity or driving  for at least two days.  He was told not to lift more than ten pounds for one  week.   DIET:  He is to be on a low salt, low fat diet.   FOLLOW UP:  1. He is to follow-up with Dr. Antoine Poche in the office in approximately two     weeks.  2. He is to call for an appointment with Dr. Scotty Court.   PROBLEM LIST AT THE TIME OF DISCHARGE:  1. Chest pain, myocardial infarction ruled out.  2. Cardiac catheterization performed on May 01, 2003; please see results     as noted above.  3. History of peptic ulcer disease.  4. History of gastroesophageal reflux disease.  5. History of transient ischemic attacks.  6. History of anxiety.  7. Positive family history of coronary artery  disease.  8. Status post multiple surgeries.  9. Previous cardiac catheterization as noted above.   PLAN:  As noted, the plan is for the patient to follow-up in the office with  Dr. Antoine Poche in approximately two weeks.  This appointment has been  scheduled.  He will have a Cardiolite performed in approximately one year or  sooner if he has further symptoms.  He was started on Lipitor and Toprol  this admission. He will need to have liver function tests and a repeat lipid  profile in approximately six to eight weeks.      Delton See, P.A. [facility]                  Rollene Rotunda, M.D.    DR/MEDQ  D:  05/02/2003  T:  05/02/2003  Job:  [id number]   cc:   Ellin Saba., M.D.  [address] [place]  [place] [address]  Fax: [phone]

## 2010-08-14 NOTE — Discharge Summary (Signed)
NAME:  [name], [name]                ACCOUNT NO.:  [id number]   MEDICAL RECORD NO.:  [id number]          PATIENT TYPE:  OIB   LOCATION:  6525                         FACILITY:  [facility]   PHYSICIAN:  Arturo Morton. Riley Kill, M.D. Romualdo Bolk OF BIRTH:  09/14/1922   DATE OF ADMISSION:  05/05/2004  DATE OF DISCHARGE:  05/06/2004                                 DISCHARGE SUMMARY   PRIMARY CARE PHYSICIAN:  Tawny Asal, M.D.   PRIMARY CARDIOLOGIST:  Cecil Cranker, M.D.   DISCHARGE DIAGNOSIS:  Status post cardiac catheterization done by Carole Binning, M.D., on May 05, 2004.  Left heart catheterization with  coronary angiography and left ventriculography, status post percutaneous  transluminal coronary angioplasty with placement of a drug eluting stent to  proximal left anterior descending.   PAST MEDICAL HISTORY:  1.  Nonobstructive coronary disease.  Cardiac catheterization May 01, 2003 with 50% mid LAD stenosis, 20% distal left anterior descending      stenosis.  Ejection fraction at that time was 65%.  Prior to this      admission, cardiac catheterization done by Dr. Jonelle Sidle on      May 01, 2003, as stated above.  2.  Peptic ulcer disease.  3.  Transient ischemic attack in 1990 with left-sided clumsiness and garbled      speech which are totally resolved.  4.  History of anxiety and stress.  5.  History of radicular pain to the left lower extremity secondary to bone      spurs.  6.  [place]   PAST SURGICAL HISTORY:  1.  Laparoscopic cholecystectomy.  2.  Right inguinal herniorrhaphy.  3.  Colonoscopy with polyp resection.   HOSPITAL COURSE:  This is a very pleasant 77 year old Caucasian gentleman  recently from [place] who presented on the day of admission with evaluation  of chest discomfort that started last week.  Patient was initially seen by  Dr. Antoine Poche at the office.  Dr. Antoine Poche felt chest pain was  rather  worrisome for unstable angina with his history of risk factors.  It was felt  that patient would need cardiac catheterization done, which could be done on  an outpatient basis.  Patient was instructed to come to the emergency room  prior to catheterization if he had any further complications.  Patient was  admitted through outpatient for cardiac catheterization on May 05, 2004, to be done by Dr. Riley Kill or Dr. Gerri Spore.  Dr. Gerri Spore did perform  catheterization.  Ejection fraction estimated to be greater than or equal to  60%.  No mitral regurgitation.  Impression was normal left ventricular  systolic function, one vessel coronary artery disease characterized by  tubular 75% stenosis in the proximal left anterior descending artery.  The  patient tolerated the PTCA with placement of a drug eluting stent in the  proximal LAD, 75% stenosis was reduced to 0% residual with TIMI-3 flow.  Integrilin continued overnight.  Patient to be treated with combination of  Plavix and full strength aspirin  for 12 months.   On the day of discharge, Dr. Antoine Poche in to see patient.  Outpatient cardiac  rehab phase II ordered.  At the time of discharge blood pressure 122/54,  heart rate 72, sating 98% on room air, lungs clear.  Right groin without  complications.  A wbc of 6.8, hemoglobin 14.5, platelets 184,000.  Potassium  4.2, BUN 11, creatinine 1.3.  Patient to be discharged home.   MEDICATIONS:  1.  Plavix 75 mg one p.o. daily for a year.  2.  Aspirin, coated, 325 mg daily.  3.  Protonix 40 mg p.o. b.i.d.  4.  Toprol XL 25 daily.  5.  Lipitor 40 mg daily at bedtime.  6.  Nitroglycerin as needed for chest pain.   PAIN MANAGEMENT:  Tylenol for general discomfort.   ACTIVITY:  No driving or heavy lifting, exertion, work or sex for three  days.   DIET:  Low salt, low fat.   WOUND CARE:  The patient is to call our office for any swelling, bleeding or  bruising from the  catheterization site.   He has a followup appointment with Dr. Ashley Jacobs. on May 19, 2004,  at 11 a.m. at our office; Dr. Scotty Court as needed or as scheduled.      MB/MEDQ  D:  05/06/2004  T:  05/06/2004  Job:  [id number]   cc:   Ellin Saba., M.D.  [address] [place]  [place] [address]  Fax: [phone]   E. Graceann Congress, M.D.

## 2010-08-14 NOTE — H&P (Signed)
NAME:  [name], [name]                          ACCOUNT NO.:  [id number]   MEDICAL RECORD NO.:  [id number]                   PATIENT TYPE:  EMS   LOCATION:  MAJO                                 FACILITY:  [facility]   PHYSICIAN:  Rollene Rotunda, M.D.                DATE OF BIRTH:  [birth year]   DATE OF ADMISSION:  05/01/2003  DATE OF DISCHARGE:                                HISTORY & PHYSICAL   PRIMARY CARE PHYSICIAN:  Tawny Asal, M.D.   CARDIOLOGIST:  Cecil Cranker, M.D.   PRESENTING CIRCUMSTANCE:  I am having chest pain with dizziness for the  last 2 days.   HISTORY OF PRESENT ILLNESS:  [name] is a 77 year old gentleman who had  a prior cardiac catheterization by Dr. Graceann Congress in 1987 which  demonstrated normal coronary anatomy.  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  He has been under a fair amount of stress lately, in  particular his daughter has a new diagnosis of hepatitis C.  [name]'  presentation here begins 2 weeks ago when he had a 10-minute experience of  central sternal chest pressure, discomfort accompanied by dizziness.  He did  not have any radiation to any part of the body, no nausea, no diaphoresis.  It was not positional and came on with mild exertion.  Then again yesterday  afternoon he had this chest pressure, centralized again, it persisted  through the afternoon, its initial period was about 10 minutes but then it  waned but then came back.  Once again, it was accompanied by a feeling of  dizziness, presyncope.  It seemed to abate towards the evening and he did  sleep overnight.  He awoke this morning without pain, went to keep a medical  appointment with Dr. Scotty Court for blood work and at the office had  recurrence of chest pressure with a feeling of I am going to pass out.   He  rates this pain as 6-7/10, it does not change with change in position, it  was readily resolved with sublingual nitroglycerin, the patient is chest  pain-free now in the emergency room.  The pain does seem to have an  exertional component, however.  The chest pain seems worse when he  ambulates.  He is admitted through the emergency room on both IV  nitroglycerin and IV heparin.  The nitroglycerin drip was discontinued  secondary to hypotension, blood pressure 86/48.   PAST MEDICAL HISTORY:  1. Significant for a history of peptic ulcer disease treated in the past.  2. Transient ischemic attack in 1990 which affected the left side, causing     clumsiness and also garbled his speech.  This did persist for some time  and he was hospitalized for 2-3 days, however, there are no sequelae.  3. He has a history of GERD.  4. Anxiety/stress.  5. Coronary artery disease in the father.  6. Radicular pain in left lower extremity secondary to vertebral bone spurs     that caused him to stop jogging 2 months ago under doctor's advise.   PAST SURGICAL HISTORY:  1. Laparoscopic cholecystectomy, May 2001.  2. Status post right inguinal herniorrhaphy.  3. Colonoscopy every 3 years with resection of benign polyps at each     colonoscopy.   SOCIAL HISTORY:  The patient lives in [place] with his wife, he has one  daughter recently diagnosed with hepatitis C.  He is a retried Engineer, site.  He is a refugee from [place], an immigrant from [place].  He does not smoke cigarettes, having quit in 1973, occasional  alcoholic beverages, no recreational drugs.   FAMILY HISTORY:  Mother died of pancreatic disease, she had no history of  coronary artery disease.  Father died at age 55 with a myocardial  infarction.  He has one sister living at 82 but she does have heart  problems.   REVIEW OF SYSTEMS:  The patient is not experiencing any fevers, chills, or  sweats.  He has  not had any weight change, loss or gain, in the recent 6  months.  HEENT:  He complains of sinus congestion with post nasal drip, this  has been bothering him recently and he has sought medical attention for this  consisting of an antibiotic and this prescription is not yet filled.  INTEGUMENT:  The patient does have erythematous plaquing on the dorsum of  both feet, this is pruritic and it is possibly a contact dermatitis since he  wears special socks which seem to keep this lesion from becoming too  prominent.  CARDIOVASCULAR:  The patient is complaining of episodes of chest  pain in the last 48 hours, more like a pressure/discomfort.  It is not  accompanied by shortness of breath.  He does not have dyspnea on exertion.  It is accompanied by feelings of presyncope, as if he is going to pass out,  he feels dizzy and wobbly.  The pain does not radiate, he is not having any  coughing, nausea or vomiting.  The patient in the last 6 months does not  have dyspnea on exertion, orthopnea, paroxysmal nocturnal dyspnea, he does  not notice edema in the extremities, he is not experiencing palpitations.  He has no history of lower extremity claudication with ambulation.  UROGENITAL:  The patient does experience nocturia at least once a night.  He  does not have any dysuria or need for straining.  He has no history of  hematuria.  NEUROLOGIC/PSYCHIATRIC ASSESSMENT:  The patient is somewhat  depressed and anxious secondary to current stressors, at least being his  daughter with newly diagnosed hepatitis C.  MUSCULOSKELETAL:  The patient is  experiencing left lower extremity radicular pain secondary to bone spurs in  the vertebrae.  He does not have joint swelling or deformity nor does he  experience any of the symptoms of degenerative joint disease.  The patient  stopped jogging 2 months ago with some improvement in the left lower extremity pain.  GI:  The patient has been having GERD symptoms for  quite  some time, he mentioned even that he was thinking of having surgical repair  and obviously his symptoms are quite bothersome to him.  He has a history of  remote peptic ulcer disease and I would say that his GI symptoms of reflux  are ongoing.  The patient does not have any endocrine derived complaints  such as polyuria, heat or cold intolerance.  The patient also has no history  of diabetes, hypertension, pulmonary embolus, DVT or thyroid disease.  Lipid  status with lipid profile is unknown at this time.   PHYSICAL EXAMINATION:  VITAL SIGNS:  Temperature 98.5, pulse 97,  respirations 20, blood pressure 115/62 although on nitroglycerin blood  pressure had dropped to 85/43.  Oxygen saturation 100% on room air.  GENERAL:  The patient is alert and oriented x3, in no acute distress, he has  no chest pain at present, seems fairly anxious.  HEENT:  Normocephalic, atraumatic.  Pupils equal round and reactive to  light, extraocular movements intact, sclerae are clear.  The nares are  patent.  Mucous membranes are pink and moist, without lesion or erythema.  He does complain of sore throat and post nasal drip.  He wears both upper  and lower dentures.  NECK:  Supple, no carotid bruits auscultated, no jugular venous distention,  no cervical lymphadenopathy.  CARDIOVASCULAR:  The heart has a regular rate and rhythm with a normal  S1/S2, it is slow, there is no murmur.  LUNGS:  Clear to auscultation and percussion bilaterally.  ABDOMEN:  Hypoactive bowel sounds throughout, he is nontender and non-  distended, he has no rebound or guarding on examination.  The abdominal  aorta is nonpulsatile, nonexpansile.  He has no hepatosplenomegaly.  UROGENITAL/RECTAL:  Deferred.  EXTREMITIES:  No evidence of clubbing or edema, lesions or petechiae exposed  for those mentioned on the dorsal surfaces of both feet.  MUSCULOSKELETAL:  No joint deformities or effusions.  He has no  costovertebral angle  tenderness.  NEUROLOGIC:  He is alert and oriented x3, cranial nerves II-XII are grossly  intact.  He has good grip strength in both upper extremities and fine motor  coordination preserved in both hands.  His radial pulses are 4/4  bilaterally, palpable pedal pulses and dorsalis pedis are 4/4 bilaterally.  SKIN:  Once again, on the skin there are erythematous plaques on the dorsal  surfaces of both feet, they are pruritic to the patient.   Chest x-ray was not undertaken.  Electrocardiogram shows a rate of 59; sinus  bradycardia; axis 60 degrees; intervals, PR is 163 milliseconds, QRS is 96  and QTC is 417; he has mild ST elevations in V1 through lead 3, otherwise is  normal; no extremely acute STT wave changes.   LABORATORY DATA:  White cells 6, hemoglobin 16.3, hematocrit 48, platelets  167,000.  Sodium 139, potassium 4.1, chloride 108, bicarbonate 25.1, BUN 24,  creatinine 1.4.  PTT is 28, PT 12.9, INR 1.0.   IMPRESSION: 1. Chest pain at rest with exertional component.  2. Gastroesophageal reflux disease, sometimes not easily controlled with     Protonix.  3. History of premature coronary artery disease in the father.  4. History of transient ischemic attack (RIND) in 1990 with no sequelae.  5. Anxiety/stress at present.  6. Status post laparoscopic cholecystectomy, May 2001.  7. Status post right inguinal herniorrhaphy.   His admission cardiac enzymes so far are negative.  Cardiac enzymes taken on  May 01, 2003 at 0948:  Myoglobin 101, CK-MB less than 1.0, troponin I  less than 0.05.  Second set taken at 1100:  Myoglobin 116, CK-MB 2.1,  troponin  I less than 0.05.   After being seen by Dr. Antoine Poche, it is feeling that the patient has chest  pain which is worse on exertion and relieved with nitroglycerin.  Dr.  Antoine Poche feels there may be some disturbance to the coronary artery which  could be best visualized by left heart catheterization.  The patient will be  scheduled  for left heart catheterization May 01, 2003.      Maple Mirza, P.A.                    Rollene Rotunda, M.D.    GM/MEDQ  D:  05/01/2003  T:  05/01/2003  Job:  [id number]   cc:   Ellin Saba., M.D.  [address] [place]  [place] [address]  Fax: [phone]   E. Graceann Congress, M.D.

## 2010-08-14 NOTE — Cardiovascular Report (Signed)
NAME:  [name], [name]                ACCOUNT NO.:  [id number]   MEDICAL RECORD NO.:  [id number]          PATIENT TYPE:  OIB   LOCATION:  2899                         FACILITY:  [facility]   PHYSICIAN:  Carole Binning, M.D. LHCDATE OF BIRTH:  01/06/1923   DATE OF PROCEDURE:  05/05/2004  DATE OF DISCHARGE:                              CARDIAC CATHETERIZATION   PROCEDURE PERFORMED:  1.  Left heart catheterization with coronary angiography and left      ventriculography.  2.  PTCA with placement of a drug-eluting stent to the proximal left      anterior descending artery.   INDICATION:  The patient is a 77 year old male with history of coronary  artery disease.  He presented to the office yesterday with symptoms of  progressive exertional angina.  He was, thus, referred for cardiac  catheterization.   CATHETERIZATION PROCEDURE NOTE:  A 6-French sheath was placed in the right  femoral artery.  Coronary angiography was performed with standard Judkins 6-  French catheters.  Left ventriculography was performed with an inguinal  pigtail catheter.  Contrast was Visipaque.  There were no complications.   RESULTS:  Hemodynamics:  Left ventricular pressure 136/18, aortic pressure 146/76.  There was no  aortic valve gradient.   Left ventriculogram:  Wall motion is normal.  Ejection fraction is estimated at greater than or  equal to 60%.  There is no mitral regurgitation.   Coronary arteriography (co-dominant):  Left main has a distal 20% stenosis.  Left anterior descending artery has a tubular 75% stenosis in the proximal  vessel.  The mid vessel has a diffuse 30% stenosis.  The distal vessel has a  diffuse 20% stenosis.  The LAD gives rise to three small diagonal branches.  Left circumflex a large, co-dominant vessel.  There is a 20% stenosis in the  ostium of the circumflex.  This vessel is otherwise normal, giving rise to a  small first obtuse marginal, a normal-sized second obtuse  marginal and two  normal-sized posterolateral branches.  Right coronary artery is a small, co-dominant vessel, ending as a small  posterior descending artery.  The right coronary artery is normal.   IMPRESSION:  1.  Normal left ventricular systolic function.  2.  One-vessel coronary artery disease, characterized by a tubular 75%      stenosis in the proximal left anterior descending artery.   PLAN:  Percutaneous intervention to the left anterior descending artery.   PERCUTANEOUS TRANSLUMINAL CORONARY ANGIOPLASTY PROCEDURAL NOTE:  We utilized  the preexisting 6-French sheath in the right femoral artery.  Heparin and  Integrilin were administered per protocol.  We used a 6-French JL4 guiding  catheter.  An Asahi soft coronary guidewire was advanced under fluoroscopic  guidance into the distal LAD.  We then positioned a 3.0 x 20-mm Taxus drug-  eluting stent across the diseased segment of vessel in the proximal LAD.  This stent was deployed at 15 atm.  We then went back with a 3.5 x 15-mm  Quantum balloon and inflated this to 22 atm in the distal aspect of the  stent  and 18 atm in the proximal aspect of the stent.  Following this, we  went in with a 4.0 x 8-mm Quantum balloon and inflated this to 16 atm in the  distal aspect of the stent and 10 atm in the mid portion of the stent.  Intermittent doses of intracoronary nitroglycerin were administered.  Final  angiographic images were obtained revealing patency to the LAD with 0%  residual stenosis at the stent site and TIMI 3 flow into the distal vessel.   COMPLICATIONS:  None.   RESULTS:  Successful PTCA with placement of a drug-eluting stent in the  proximal left anterior descending artery.  A 75% stenosis was reduced to 0%  residual with TIMI 3 flow.   PLAN:  Integrilin will be continued overnight.  The patient will be treated  with a combination of Plavix and full-strength aspirin for 12 months.      MWP/MEDQ  D:  05/05/2004  T:   05/05/2004  Job:  [id number]   cc:   Ellin Saba., M.D.  [address] [place]  [place] [address]  Fax: [phone]   Rollene Rotunda, M.D.   Cardiac Catheterization Lab

## 2010-08-14 NOTE — Assessment & Plan Note (Signed)
HEALTHCARE                            CARDIOLOGY OFFICE NOTE   NAME:[name], [name]                       MRN:          [id number]  DATE:07/22/2006                            DOB:          1922/10/07    REASON FOR PRESENTATION:  Evaluate patient with coronary disease.   HISTORY OF PRESENT ILLNESS:  Patient returns for followup. He has done  well in the last year. He denies any chest discomfort, neck, or arm  discomfort. He has had not palpitations, pre syncope, or syncope. He  denies any PND or orthopnea. He walks, rides his bicycle, and does push  ups for exercise.   PAST MEDICAL HISTORY:  Coronary artery disease (PTCA and stenting of the  proximal LAD with a 76% stenosis reduced to 0% in 2005), peptic ulcer  disease, transient ishemic attack in 1990 with left-sided clumsiness,  anxiety, and stress, radicular pain, chololithiasis, hyperlipidemia.   ALLERGIES:  PENICILLIN. HE HAS BEEN INTOLERANT TO CRESTOR.   MEDICATIONS:  1. Nexium 40 mg b.i.d.  2. Lipitor 20 mg daily.  3. Toprol 25 mg daily.  4. NitroQuick.  5. Aspirin 325 mg daily.  6. Vitamin C.  7. Zymar.  8. Hydrocodone.  9. Hyoscyamine.   REVIEW OF SYSTEMS:  As stated in the HPI and otherwise negative for  systems.   PHYSICAL EXAMINATION:  Patient is in no distress. Blood pressure 124/65,  heart rate 71 and regular, weight 160 pounds, body mass index 22.  HEENT: Eyelids unremarkable, pupils equal, round, and reactive to light.  Fundi not visualized. Oral mucosa unremarkable.  NECK: No jugular venous distension, wave form within normal limits.  Carotid upstrokes brisk and symmetric. No bruits. No thyromegaly.  LYMPHATICS:  No cervical, axillary, or inguinal adenopathy.  LUNGS: Clear to auscultation bilaterally.  BACK: No costovertebral angle tenderness.  CHEST: Unremarkable.  HEART: PMI not displaced or sustained. S1 and S2 within normal limits.  No S3. No S4, clicks, rubs,  murmurs.  ABDOMEN: Flat, positive bowel sounds normal to frequency and pitch. No  bruits, rebound, no guarding. No midline pulsatile mass. No  hepatomegaly. No splenomegaly.  SKIN: No rashes. No nodules.  EXTREMITIES: 2 + pulses throughout. No edema, cyanosis, or clubbing.  NEURO: Oriented to person, place, and time. Cranial nerves II-XII  grossly intact. Motor grossly intact.   EKG: Sinus rhythm, rate 67. Axis within normal limits. Intervals within  normal limits. No acute ST-T wave changes.   ASSESSMENT/PLAN:  1. Coronary disease, patient is having no new symptoms. He is      participating in secondary risk reduction. No further      cardiovascular testing is suggested.  2. Dyslipidemia, he has a reasonable lipid profile. He will continue      on Lipitor per Dr. Scotty Court.  3. Follow up, I will see the patient back in 1 year or sooner if      needed.     Rollene Rotunda, MD, [facility]  Electronically Signed    JH/MedQ  DD: 07/22/2006  DT: 07/22/2006  Job #: [id number]   cc:   Chrissie Noa  Donalee Citrin., MD

## 2010-08-14 NOTE — Assessment & Plan Note (Signed)
[facility]                         GASTROENTEROLOGY OFFICE NOTE   NAME:[name], [name]                       MRN:          [id number]  DATE:03/20/2007                            DOB:          1922/10/06    REQUESTING PHYSICIAN:  Tawny Asal, MD.   REASON FOR CONSULTATION:  Constipation, gas and bloating.   HISTORY:  [name] has a history of mild gastritis, diverticulosis,  internal hemorrhoids and adenomatous colon polyps.  He underwent  colonoscopy and upper endoscopy in April 2008.  He relates worsening  problems with constipation.  He notes hard stools with significant  straining.  He has worsening problems with gas, bloating, flatulence and  occasional nausea.  He notes a variation of his weight.  His weight does  appear to have stabilized, although it has dropped about 20 pounds from  his peak weight.  He notes no change in stool caliber, rectal bleeding,  change in appetite, vomiting, dysphagia or odynophagia.   PAST MEDICAL HISTORY:  1. Coronary artery disease, status post LAD stent placement.  2. Peptic ulcer disease.  3. TIA.  4. Anxiety.  5. Hyperlipidemia.  6. Kidney stones.  7. Diverticulosis.  8. Internal hemorrhoids.  9. Adenomatous colon polyps.  10.Status post cholecystectomy.  11.Status post left eye cataract extract.  12.Gastritis.   CURRENT MEDICATIONS:  Listed on the chart, updated and reviewed.   ALLERGIES:  1. PENICILLIN leading to a rash.  2. CRESTOR leading to muscle aches.   SOCIAL HISTORY/REVIEW OF SYSTEMS:  Per the handwritten forms.   PHYSICAL EXAMINATION:  GENERAL:  Well-developed, mildly anxious white  male.  VITAL SIGNS:  Height 5 feet 8 inches, weight 174.2 pounds, blood  pressure 124/70, pulse 72 and regular.  HEENT:  Anicteric sclerae.  Oropharynx is clear.  CHEST:  Clear to auscultation bilaterally.  CARDIAC:  Regular rate and rhythm without murmurs.  ABDOMEN:  Soft, nontender and  nondistended.  Normoactive bowel sounds.  No palpable organomegaly, masses or hernias.  EXTREMITIES:  Without clubbing, cyanosis, or edema.  NEUROLOGIC:  Alert and oriented x 3.  Grossly nonfocal.   ASSESSMENT/PLAN:  Constipation and worsening problems with intestinal  gas, flatus and bloating.  He is advised to discontinue all lactose  products and carbonated beverages for the next 2 weeks.  Begin a low-gas  diet.  Increase his fiber and fluid intake.  He may use a mild laxative  such as MiraLax or Milk of Magnesia.  He is also advised to try stool  softener if the fiber is not adequate in improving his hard stools and  straining.  He is recommended to continue on Nexium 40 mg b.i.d. and to  begin the use of 4-inch bed blocks and anti-reflux measures.  Return  office visit in 6-8 weeks.     Venita Lick. Russella Dar, MD, [facility]  Electronically Signed    MTS/MedQ  DD: 03/30/2007  DT: 03/30/2007  Job #: [id number]   cc:   Ellin Saba., MD

## 2010-08-14 NOTE — Discharge Summary (Signed)
NAME:  [name], [name]                ACCOUNT NO.:  [id number]   MEDICAL RECORD NO.:  [id number]          PATIENT TYPE:  INP   LOCATION:  1610                         FACILITY:  [facility]   PHYSICIAN:  Alexzandrew L. Perkins, P.A.C.DATE OF BIRTH:  05/23/1922   DATE OF ADMISSION:  04/25/2007  DATE OF DISCHARGE:  04/29/2007                               DISCHARGE SUMMARY   ADMISSION DIAGNOSES:  1. Right femoral neck fracture.  2. Mild coronary arterial disease.  3. Peptic ulcer disease.  4. History of transient ischemic attack in 1990.  5. Gastroesophageal reflux disease.  6. History of renal calculi.  7. History of anxiety.  8. Diverticulosis.  9. Internal hemorrhoids.  10.Macular degeneration.  11.History of mastoiditis.   DISCHARGE DIAGNOSES:  1. Right femoral neck fracture status post right hip in situ hip      pinning.  2. Postop hyponatremia.  3. Mild coronary arterial disease.  4. Peptic ulcer disease.  5. History of transient ischemic attack in 1990.  6. Gastroesophageal reflux disease.  7. History of renal calculi.  8. History of anxiety.  9. Diverticulosis.  10.Internal hemorrhoids.  11.Macular degeneration.  12.History of mastoiditis.   PROCEDURE:  April 26, 2007, right hip pinning.  Surgeon Dr. Lequita Halt.   ANESTHESIA:  General.   CONSULTS:  None.   BRIEF HISTORY:  [name] is an 77 year old male with a fall at home  prior to admission.  He sustained a right hip injury, seen in the [facility] emergency department, found to have right hip fracture.  The  patient was seen and admitted for surgical intervention.   LABORATORY DATA:  Preop CBC showed a hemoglobin of 13.7, hematocrit  39.9, white cell count 9.9, platelets 152.  Postop hemoglobin down to  12.3.  Last H&H 11.7 and 34.0.  PT/PTT preop 14.2 and 26 respectively.  INR 1.1.  Serial protimes follows:  Noted PT/INR 18.2/1.5.  Chem panel  on admission:  Low total protein of 5.9, low albumin of 3.3, high  total  bili 1.5.  Remaining Chem panel within normal limits.  Serial B-mets are  followed.  Sodium did drop from 139 to 137, last noted at 133.  Preop UA  trace hemoglobin 0-2 white, 0-2 red cells, otherwise negative.   EKG on April 26, 2007, normal sinus rhythm, normal EKG confirmed.  Chest X-Ray: April 24, 2007, cardiomegaly, mild vascular congestion  low lung volumes.  Hip films on April 24, 2007, nondisplaced base  cervical neck fracture right femur with some osteopenia noted.  Inner  office films April 26, 2007, reduction of right femoral neck fracture  status post pinning.   HOSPITAL COURSE:  The patient was admitted to [facility] for  the above-stated injury, placed at bedrest and given pain medication.  Felt to require surgical procedure.  Was placed at bedrest until the  following day.  Once more time was available, he was scheduled to be  added on in the afternoon the following day of 128.  He was made n.p.o.  after a liquid breakfast and taken the operating room on the  evening of  hospital day #2 on January 28, tolerated procedure well, later  transferred to the recovery room and back to the orthopedic floor, given  p.o. and PCA analgesic pain control following surgery.  Did start on  Coumadin for DVT prophylaxis.  Doing pretty well on postop day #1.  Was  little drowsy, felt to be due to medicine.  We are going to start  reducing the medication.  He took Nexium for peptic ulcer disease and  reflux.  We started back on that.  Hemoglobin was stable postop.  Started getting up out of bed with therapy.  Did fairly well on day #1.  Day #2 was already walking 100 feet.  Dressing changed on day #2.  Small  incision was healing well with no signs of infection.  Still hurting a  little bit but doing better, could tell a difference.  Put on Lovenox  since he was at bedrest for 24 hours prior to surgery since INR was  therapeutic.  Continued to get up with therapy.   By April 29, 2007,  the patient was doing well, much better with mobility, tolerating diet,  meeting goals and was discharged home.   DISCHARGE PLAN:  1. Discharged home on April 29, 2007.  2. Discharge diagnoses, please see above.  3. Discharge medications:  Percocet, Robaxin, Coumadin.   DISCHARGE INSTRUCTIONS:  Activity:  Weightbearing as tolerated right  leg.  Home health PT, home health nursing and OT.  Follow-up 2 weeks.   DISPOSITION:  Home.   CONDITION ON DISCHARGE:  Improving.      Alexzandrew L. Perkins, P.A.C.     ALP/MEDQ  D:  06/06/2007  T:  06/07/2007  Job:  [id number]   cc:   Ollen Gross, M.D.  Fax: [phone]   Ellin Saba., MD  [address] [place]  [place] [address]   E. Graceann Congress, MD, [facility]  [address]  [address]  [place], [place]  [address]

## 2010-08-14 NOTE — Op Note (Signed)
NAME:  [name], [name]                ACCOUNT NO.:  [id number]   MEDICAL RECORD NO.:  [id number]          PATIENT TYPE:  AMB   LOCATION:  DAY                          FACILITY:  [facility]   PHYSICIAN:  Excell Seltzer. Annabell Howells, M.D.    DATE OF BIRTH:  01-08-1923   DATE OF PROCEDURE:  11/02/2005  DATE OF DISCHARGE:                                 OPERATIVE REPORT   PROCEDURE:  Cystoscopy with right ureteroscopic stone extraction.   PREOPERATIVE DIAGNOSIS:  Right ureterovesical junction stone.   POSTOPERATIVE DIAGNOSIS:  Right ureterovesical junction stone.   SURGEON:  Dr. Bjorn Pippin.   ANESTHESIA:  General.   SPECIMEN:  Stone.   DRAINS:  Foley catheter.   COMPLICATIONS:  None.   INDICATIONS:  [name] is an 77 year old white male with a 5 mm right UVJ  stone who has elected ureteroscopic stone extraction.   FINDINGS/PROCEDURE:  The patient was given Cipro, he was taken to the  operating room where a general anesthetic was induced.  He was placed in  lithotomy position, his perineum and genitalia were prepped with Betadine  solution, he was draped in the usual sterile fashion.  Cystoscopy was  performed using the 22-French scope and 12 and 70 degrees lenses.  Examination revealed a normal urethra.  The external sphincter was intact.  The prostatic urethra was short with a high bladder neck with mild  obstruction.  Examination of bladder revealed mild to moderate  trabeculation.  No tumors or stones were noted.  The ureteral orifices were  in their normal anatomic position.  The left was unremarkable.  The right  had a small anterior located orifice suggestive of a ureterocele which was  consistent with the findings on CT scan.   A guidewire was passed through the right ureteral orifice to the kidney and  then the cystoscope was removed.  A 12-French access sheath inner dilator  was then passed over the wire without difficulty dilating the distal ureter.   A 6-French short ureteroscope  was then inserted alongside the wire, the  stone was visualized, grasped with a nitinol basket and removed without  difficulty.   The cystoscope was reinserted over the wire. Examination revealed a widely  patent ureteral orifice. I did feel a stent was indicated.  The wire was  removed.  Inspection of the prostatic urethra relieved some mild mucosal  injury from where the scope had to go over the high bladder neck with  slightly oozing. It was felt a Foley catheter was indicated  so the cystoscope was removed and an 18-French Foley catheter was inserted.  The wound was filled with 10 mL of sterile fluid.  The patient was taken  down from lithotomy position, his anesthetic was reversed, he was moved to  the recovery room in stable condition. The stone was given to the patient's  family. There were no complications.      Excell Seltzer. Annabell Howells, M.D.  Electronically Signed     JJW/MEDQ  D:  11/02/2005  T:  11/02/2005  Job:  [id number]   cc:   Adam Asal  Montez Hageman., M.D.  [address] [place]  [place] [address]   [name], M.D.  [address]  [address]  [place]  [place] [address]

## 2010-10-15 ENCOUNTER — Ambulatory Visit: Payer: Self-pay | Admitting: Family Medicine

## 2010-11-02 ENCOUNTER — Encounter: Payer: Self-pay | Admitting: Family Medicine

## 2010-11-02 ENCOUNTER — Ambulatory Visit (INDEPENDENT_AMBULATORY_CARE_PROVIDER_SITE_OTHER): Payer: Medicare Other | Admitting: Family Medicine

## 2010-11-02 DIAGNOSIS — M26629 Arthralgia of temporomandibular joint, unspecified side: Secondary | ICD-10-CM

## 2010-11-02 DIAGNOSIS — M2669 Other specified disorders of temporomandibular joint: Secondary | ICD-10-CM

## 2010-11-02 NOTE — Progress Notes (Signed)
  Subjective:    Patient ID: [name], male    DOB: 10/26/1922, 77 y.o.   MRN: [id number]  HPI [name] Is in 77 year old male, who comes in today for evaluation of pain in his right ear for 4 months.  He states he has sharp pain in his right ear that comes and goes.  It may occur once or twice a week.  He thinks his hearing is also diminished.  He did have a salivary gland removed by Dr. Jearld Fenton.  His also been seeing a rheumatologist because of arthritis and what he tells me as PMR.  Is currently on prednisone 5 mg a day.  He's taking no other anti-inflammatory medications.  No history of recent dental surgery, trauma, etc..   Review of Systems    General an ENT review of systems otherwise negative Objective:   Physical Exam  Well-developed well-nourished, male in no acute distress.  Examination of the HEENT were negative.  Neck was supple.  No adenopathy.  Scar from previous surgery right submandibular gland      Assessment & Plan:  TMJ syndrome plan mouthguard to consult with ENT because of hearing loss

## 2010-11-02 NOTE — Patient Instructions (Signed)
Go to [employer] and purchase a mouth guard.  Consult with Dr. Jearld Fenton, your ENT about the hearing loss

## 2010-12-17 LAB — CBC
Hemoglobin: 12.3 — ABNORMAL LOW
Hemoglobin: 13.7
MCHC: 34.4
MCHC: 34.4
MCHC: 34.6
MCHC: 34.7
MCV: 89.8
MCV: 90
MCV: 90.1
MCV: 90.4
MCV: 90.5
Platelets: 119 — ABNORMAL LOW
Platelets: 134 — ABNORMAL LOW
Platelets: 152
RBC: 3.76 — ABNORMAL LOW
RBC: 4.38
RDW: 12.4
RDW: 12.5
RDW: 12.7
WBC: 6.4
WBC: 9.9

## 2010-12-17 LAB — BASIC METABOLIC PANEL
BUN: 14
CO2: 27
CO2: 30
Calcium: 8.2 — ABNORMAL LOW
Chloride: 100
Chloride: 102
Creatinine, Ser: 1.3
Creatinine, Ser: 1.31
GFR calc Af Amer: 55 — ABNORMAL LOW
Glucose, Bld: 110 — ABNORMAL HIGH
Glucose, Bld: 98
Sodium: 137
Sodium: 137

## 2010-12-17 LAB — COMPREHENSIVE METABOLIC PANEL
AST: 30
Albumin: 3.3 — ABNORMAL LOW
Calcium: 8.7
Chloride: 106
Creatinine, Ser: 1.24
GFR calc Af Amer: >60

## 2010-12-17 LAB — URINALYSIS, ROUTINE W REFLEX MICROSCOPIC
Ketones, ur: NEGATIVE
Leukocytes, UA: NEGATIVE
Nitrite: NEGATIVE
Specific Gravity, Urine: 1.021
pH: 6.5

## 2010-12-17 LAB — PROTIME-INR
INR: 1.2
INR: 1.5
Prothrombin Time: 15.9 — ABNORMAL HIGH
Prothrombin Time: 18.2 — ABNORMAL HIGH

## 2010-12-17 LAB — DIFFERENTIAL
Eosinophils Relative: 0
Lymphocytes Relative: 9 — ABNORMAL LOW
Lymphs Abs: 0.8
Monocytes Absolute: 0.4

## 2010-12-17 LAB — URINE MICROSCOPIC-ADD ON

## 2010-12-18 ENCOUNTER — Other Ambulatory Visit: Payer: Self-pay | Admitting: Gastroenterology

## 2010-12-21 ENCOUNTER — Other Ambulatory Visit: Payer: Self-pay | Admitting: Gastroenterology

## 2010-12-28 ENCOUNTER — Other Ambulatory Visit: Payer: Self-pay | Admitting: Gastroenterology

## 2010-12-28 MED ORDER — ESOMEPRAZOLE MAGNESIUM 40 MG PO CPDR: 40.0000 mg | DELAYED_RELEASE_CAPSULE | Freq: Two times a day (BID) | ORAL | Status: DC

## 2010-12-28 NOTE — Telephone Encounter (Signed)
Sent the prescription for Nexium to the pharmacy but pt needs a office visit for any further refills.

## 2010-12-30 LAB — CBC
Platelets: 164
RDW: 12.8
WBC: 6.2

## 2010-12-30 LAB — BASIC METABOLIC PANEL
BUN: 19
Creatinine, Ser: 1.09
GFR calc non Af Amer: >60
Glucose, Bld: 96

## 2011-01-20 ENCOUNTER — Other Ambulatory Visit: Payer: Self-pay | Admitting: Gastroenterology

## 2011-02-01 ENCOUNTER — Telehealth: Payer: Self-pay | Admitting: Gastroenterology

## 2011-02-01 MED ORDER — ESOMEPRAZOLE MAGNESIUM 40 MG PO CPDR: 40.0000 mg | DELAYED_RELEASE_CAPSULE | Freq: Two times a day (BID) | ORAL | Status: DC

## 2011-02-01 NOTE — Telephone Encounter (Signed)
Sent one refill to his pharmacy and put in comments to schedule office visit for any further refills.

## 2011-03-17 ENCOUNTER — Telehealth: Payer: Self-pay

## 2011-03-17 NOTE — Telephone Encounter (Signed)
Pt states he is having a nasal congestion problem and some coughing.  Pt states his nose is totally plugged up and it is keeping pt from sleeping.  Pt denies temperature, achy body, and chills. Pt aware that he will be treated for acute issue only and a list of [place] offices will be given for him to choose new pcp.

## 2011-03-18 ENCOUNTER — Ambulatory Visit (INDEPENDENT_AMBULATORY_CARE_PROVIDER_SITE_OTHER): Payer: Medicare Other | Admitting: Family Medicine

## 2011-03-18 ENCOUNTER — Encounter: Payer: Self-pay | Admitting: Family Medicine

## 2011-03-18 ENCOUNTER — Other Ambulatory Visit: Payer: Self-pay | Admitting: Otolaryngology

## 2011-03-18 DIAGNOSIS — H9209 Otalgia, unspecified ear: Secondary | ICD-10-CM

## 2011-03-18 DIAGNOSIS — J309 Allergic rhinitis, unspecified: Secondary | ICD-10-CM

## 2011-03-18 DIAGNOSIS — R05 Cough: Secondary | ICD-10-CM

## 2011-03-18 NOTE — Patient Instructions (Signed)
Zyrtec OTC once daily.  Allergic Rhinitis Allergic rhinitis is when the mucous membranes in the nose respond to allergens. Allergens are particles in the air that cause your body to have an allergic reaction. This causes you to release allergic antibodies. Through a chain of events, these eventually cause you to release histamine into the blood stream (hence the use of antihistamines). Although meant to be protective to the body, it is this release that causes your discomfort, such as frequent sneezing, congestion and an itchy runny nose.  CAUSES  The pollen allergens may come from grasses, trees, and weeds. This is seasonal allergic rhinitis, or "hay fever." Other allergens cause year-round allergic rhinitis (perennial allergic rhinitis) such as house dust mite allergen, pet dander and mold spores.  SYMPTOMS   Nasal stuffiness (congestion).   Runny, itchy nose with sneezing and tearing of the eyes.   There is often an itching of the mouth, eyes and ears.  It cannot be cured, but it can be controlled with medications. DIAGNOSIS  If you are unable to determine the offending allergen, skin or blood testing may find it. TREATMENT   Avoid the allergen.   Medications and allergy shots (immunotherapy) can help.   Hay fever may often be treated with antihistamines in pill or nasal spray forms. Antihistamines block the effects of histamine. There are over-the-counter medicines that may help with nasal congestion and swelling around the eyes. Check with your caregiver before taking or giving this medicine.  If the treatment above does not work, there are many new medications your caregiver can prescribe. Stronger medications may be used if initial measures are ineffective. Desensitizing injections can be used if medications and avoidance fails. Desensitization is when a patient is given ongoing shots until the body becomes less sensitive to the allergen. Make sure you follow up with your caregiver if  problems continue. SEEK MEDICAL CARE IF:   You develop fever (more than 100.5 F (38.1 C).   You develop a cough that does not stop easily (persistent).   You have shortness of breath.   You start wheezing.   Symptoms interfere with normal daily activities.  Document Released: 12/08/2000 Document Revised: 11/25/2010 Document Reviewed: 06/19/2008 [facility] Patient Information 2012 [place], [place].

## 2011-03-18 NOTE — Progress Notes (Signed)
Subjective:    Patient ID: [name], male    DOB: Jan 18, 1923, 77 y.o.   MRN: [id number]  HPI 77 year old white male, nonsmoker, in with complaints of nasal congestion been going on for one week. He's been using steam and Mucinex that is helping his symptoms. Overall he feels like he is better. He denies any sinus pressure or pain, no fever, shortness of breath, chest pain, myalgias.   Review of Systems  Constitutional: Negative.   HENT: Positive for congestion and postnasal drip.   Respiratory: Negative.   Cardiovascular: Negative.   Gastrointestinal: Negative.   Skin: Negative.   Hematological: Negative.    No past medical history on file.  History   Social History  . Marital Status: Married    Spouse Name: N/A    Number of Children: N/A  . Years of Education: N/A   Occupational History  . Not on file.   Social History Main Topics  . Smoking status: Never Smoker   . Smokeless tobacco: Never Used  . Alcohol Use: No  . Drug Use: No  . Sexually Active: Not Currently   Other Topics Concern  . Not on file   Social History Narrative  . No narrative on file    No past surgical history on file.  No family history on file.  Allergies  Allergen Reactions  . Amoxicillin     REACTION: unspecified  . Doxycycline   . Erythromycin     REACTION: unspecified  . Penicillins   . Sulfamethoxazole W/Trimethoprim     Current Outpatient Prescriptions on File Prior to Visit  Medication Sig Dispense Refill  . aspirin 81 MG tablet Take 81 mg by mouth 2 (two) times daily.        Marland Kitchen atorvastatin (LIPITOR) 20 MG tablet Take 20 mg by mouth 2 (two) times daily. Brand name only       . cyclobenzaprine (FLEXERIL) 5 MG tablet TAKE 1 TABLET (5 MG TOTAL) BY MOUTH EVERY 8 (EIGHT) HOURS AS NEEDED FOR MUSCLE SPASMS.  20 tablet  0  . diclofenac (VOLTAREN) 50 MG EC tablet Take 1 tablet (50 mg total) by mouth 2 (two) times daily as needed.  20 tablet  0  . ergocalciferol (VITAMIN D2)  50000 UNITS capsule Take 1 capsule (50,000 Units total) by mouth once a week.  4 capsule  6  . esomeprazole (NEXIUM) 40 MG capsule Take 1 capsule (40 mg total) by mouth 2 (two) times daily.  60 capsule  0  . indomethacin (INDOCIN) 25 MG capsule Take 25 mg by mouth 2 (two) times daily with a meal.        . meloxicam (MOBIC) 15 MG tablet Take 15 mg by mouth daily.        . metoprolol (TOPROL-XL) 25 MG 24 hr tablet Take 25 mg by mouth daily.        . metoprolol tartrate (LOPRESSOR) 25 MG tablet TAKE 1 TABLET BY MOUTH ONCE A DAY  90 tablet  2  . nitroGLYCERIN (NITROSTAT) 0.4 MG SL tablet Place 0.4 mg under the tongue every 5 (five) minutes as needed.        . predniSONE (DELTASONE) 5 MG tablet Take 5 mg by mouth as directed.          BP 160/82  Pulse 95  Temp(Src) 98.4 F (36.9 C) (Oral)  Wt 186 lb (84.369 kg)chart    Objective:   Physical Exam  Constitutional: He is oriented to person, place, and  time. He appears well-developed and well-nourished.  HENT:  Right Ear: External ear normal.  Left Ear: External ear normal.  Mouth/Throat: Oropharynx is clear and moist.       Nasal turbinates boggy.   Neck: Normal range of motion. Neck supple.  Cardiovascular: Normal rate, regular rhythm and normal heart sounds.   Pulmonary/Chest: Effort normal and breath sounds normal.  Neurological: He is alert and oriented to person, place, and time.  Skin: Skin is warm and dry.  Psychiatric: He has a normal mood and affect.          Assessment & Plan:  Assessment: Allergic Rhinitis  Plan: OTC Zyrtec once daily. Call if symptoms worsen or persist.

## 2011-03-19 ENCOUNTER — Ambulatory Visit
Admission: RE | Admit: 2011-03-19 | Discharge: 2011-03-19 | Disposition: A | Discharge status: Home / Self Care | Payer: Medicare Other | Source: Ambulatory Visit | Attending: Otolaryngology | Admitting: Otolaryngology

## 2011-03-19 DIAGNOSIS — H9209 Otalgia, unspecified ear: Secondary | ICD-10-CM

## 2011-03-19 MED ORDER — IOHEXOL 300 MG/ML  SOLN
75.0000 mL | Freq: Once | INTRAMUSCULAR | Status: AC | PRN
  Administered 2011-03-19: 75 mL via INTRAVENOUS

## 2011-03-31 DIAGNOSIS — L219 Seborrheic dermatitis, unspecified: Secondary | ICD-10-CM | POA: Diagnosis not present

## 2011-03-31 DIAGNOSIS — L738 Other specified follicular disorders: Secondary | ICD-10-CM | POA: Diagnosis not present

## 2011-04-04 ENCOUNTER — Other Ambulatory Visit: Payer: Self-pay | Admitting: Gastroenterology

## 2011-04-05 DIAGNOSIS — R599 Enlarged lymph nodes, unspecified: Secondary | ICD-10-CM | POA: Diagnosis not present

## 2011-04-11 ENCOUNTER — Other Ambulatory Visit: Payer: Self-pay | Admitting: Gastroenterology

## 2011-04-12 ENCOUNTER — Other Ambulatory Visit: Payer: Self-pay | Admitting: Gastroenterology

## 2011-04-13 ENCOUNTER — Other Ambulatory Visit: Payer: Self-pay | Admitting: Gastroenterology

## 2011-04-13 MED ORDER — ESOMEPRAZOLE MAGNESIUM 40 MG PO CPDR: 40.0000 mg | DELAYED_RELEASE_CAPSULE | Freq: Two times a day (BID) | ORAL | Status: DC

## 2011-04-13 NOTE — Telephone Encounter (Signed)
Called patient with no answer and no voicemail. Will send one refill to his pharmacy until scheduled appointment on 04/21/10.

## 2011-04-16 DIAGNOSIS — R599 Enlarged lymph nodes, unspecified: Secondary | ICD-10-CM | POA: Diagnosis not present

## 2011-04-20 ENCOUNTER — Ambulatory Visit: Payer: Medicare Other | Admitting: Gastroenterology

## 2011-04-22 ENCOUNTER — Encounter: Payer: Self-pay | Admitting: Gastroenterology

## 2011-04-22 ENCOUNTER — Ambulatory Visit (INDEPENDENT_AMBULATORY_CARE_PROVIDER_SITE_OTHER): Payer: Medicare Other | Admitting: Gastroenterology

## 2011-04-22 VITALS — BP 148/80 | HR 68 | Ht 70.0 in | Wt 186.0 lb

## 2011-04-22 DIAGNOSIS — J3489 Other specified disorders of nose and nasal sinuses: Secondary | ICD-10-CM

## 2011-04-22 DIAGNOSIS — R05 Cough: Secondary | ICD-10-CM

## 2011-04-22 NOTE — Progress Notes (Signed)
History of Present Illness: This is an 78 year old male who complains of the morning cough, difficulty clearing thick mucus from his chest in the morning and sinus drainage. He has had abnormal adenopathy noted in his neck on a recent CT scan and a biopsy is planned tomorrow with Dr. Suzanna Obey. Sinus abnormalities were also noted on the recent CT scan. He remains on Nexium 40 mg twice daily his reflux symptoms appear to be under good control. Denies weight loss, abdominal pain, constipation, diarrhea, change in stool caliber, melena, hematochezia, nausea, vomiting, dysphagia, reflux symptoms, chest pain.  Current Medications, Allergies, Past Medical History, Past Surgical History, Family History and Social History were reviewed in [employer] record.  Physical Exam: General: Well developed , well nourished, no acute distress Head: Normocephalic and atraumatic Eyes:  sclerae anicteric, EOMI Ears: Normal auditory acuity Mouth: No deformity or lesions Lungs: Clear throughout to auscultation Heart: Regular rate and rhythm; no murmurs, rubs or bruits Abdomen: Soft, non tender and non distended. No masses, hepatosplenomegaly or hernias noted. Normal Bowel sounds Musculoskeletal: Symmetrical with no gross deformities  Pulses:  Normal pulses noted Extremities: No clubbing, cyanosis, edema or deformities noted Neurological: Alert oriented x 4, grossly nonfocal Psychological:  Alert and cooperative. Normal mood and affect  Assessment and Recommendations:  1. Morning cough, sinus drainage. I do not feel his symptoms are related to GERD. Further followup with Dr. Jearld Fenton and Dr. Amador Cunas.  2. GERD. Continue Nexium 40 mg twice daily and standard antireflux measures.

## 2011-04-22 NOTE — Patient Instructions (Signed)
Continue current medications.  Cc: Eleonore Chiquito, MD       Suzanna Obey, MD

## 2011-04-23 ENCOUNTER — Other Ambulatory Visit: Payer: Self-pay | Admitting: Otolaryngology

## 2011-04-23 DIAGNOSIS — R599 Enlarged lymph nodes, unspecified: Secondary | ICD-10-CM | POA: Diagnosis not present

## 2011-04-23 DIAGNOSIS — R220 Localized swelling, mass and lump, head: Secondary | ICD-10-CM | POA: Diagnosis not present

## 2011-04-23 DIAGNOSIS — D101 Benign neoplasm of tongue: Secondary | ICD-10-CM | POA: Diagnosis not present

## 2011-04-26 ENCOUNTER — Other Ambulatory Visit: Payer: Self-pay | Admitting: Cardiology

## 2011-04-29 ENCOUNTER — Encounter: Payer: Self-pay | Admitting: Internal Medicine

## 2011-04-29 ENCOUNTER — Ambulatory Visit (INDEPENDENT_AMBULATORY_CARE_PROVIDER_SITE_OTHER): Payer: Medicare Other | Admitting: Internal Medicine

## 2011-04-29 DIAGNOSIS — E538 Deficiency of other specified B group vitamins: Secondary | ICD-10-CM

## 2011-04-29 DIAGNOSIS — L0390 Cellulitis, unspecified: Secondary | ICD-10-CM

## 2011-04-29 DIAGNOSIS — M353 Polymyalgia rheumatica: Secondary | ICD-10-CM

## 2011-04-29 DIAGNOSIS — L0291 Cutaneous abscess, unspecified: Secondary | ICD-10-CM

## 2011-04-29 MED ORDER — MOXIFLOXACIN HCL 400 MG PO TABS: 400.0000 mg | ORAL_TABLET | Freq: Every day | ORAL | Status: AC

## 2011-04-29 MED ORDER — CYANOCOBALAMIN 1000 MCG PO TABS: 1000.0000 ug | ORAL_TABLET | Freq: Every day | ORAL | Status: DC

## 2011-04-29 NOTE — Progress Notes (Signed)
  Subjective:    Patient ID: [name], male    DOB: 1922-10-12, 78 y.o.   MRN: [id number]  HPI  78 year old patient who is seen today to establish with this practice. Medical records were reviewed and patient interviewed and examined. His chief complaint today is pain and swelling involving his left distal arm. He felt this was a flareup of his arthritis but after discussing with his rheumatologist was referred here. He does have a history of seborrheic dermatitis He is following rheumatology for polymyalgia rheumatica and has been on a slow prednisone taper for some time. His present dose is 4 mg with plans to decrease by 1 mg per month. He has been followed by GI due to the cough. He describes postnasal drip. He has a history of hypertension and dyslipidemia. He also has a history of B12 deficiency but no longer takes oral supplements.    Review of Systems  Constitutional: Negative for fever, chills, appetite change and fatigue.  HENT: Positive for rhinorrhea and postnasal drip. Negative for hearing loss, ear pain, congestion, sore throat, trouble swallowing, neck stiffness, dental problem, voice change and tinnitus.   Eyes: Negative for pain, discharge and visual disturbance.  Respiratory: Positive for cough. Negative for chest tightness, wheezing and stridor.   Cardiovascular: Negative for chest pain, palpitations and leg swelling.  Gastrointestinal: Negative for nausea, vomiting, abdominal pain, diarrhea, constipation, blood in stool and abdominal distention.  Genitourinary: Negative for urgency, hematuria, flank pain, discharge, difficulty urinating and genital sores.  Musculoskeletal: Negative for myalgias, back pain, joint swelling, arthralgias and gait problem.  Skin: Positive for color change and rash.  Neurological: Negative for dizziness, syncope, speech difficulty, weakness, numbness and headaches.  Hematological: Negative for adenopathy. Does not bruise/bleed easily.    Psychiatric/Behavioral: Negative for behavioral problems and dysphoric mood. The patient is not nervous/anxious.        Objective:   Physical Exam  Constitutional: He is oriented to person, place, and time. He appears well-developed and well-nourished.       Blood pressure well controlled  HENT:  Head: Normocephalic.  Right Ear: External ear normal.  Left Ear: External ear normal.  Eyes: Conjunctivae and EOM are normal.  Neck: Normal range of motion.  Cardiovascular: Normal rate, normal heart sounds and intact distal pulses.   Pulmonary/Chest: Breath sounds normal.  Abdominal: Soft. Bowel sounds are normal.  Musculoskeletal: Normal range of motion. He exhibits no edema and no tenderness.  Neurological: He is alert and oriented to person, place, and time.  Skin: Skin is warm. Rash noted. There is erythema.        Scattered seborrhea of the face especially over the upper lip and chin. Generalized flaking  There is erythema and soft tissue swelling and tenderness involving the left forearm area A tender epitrochlear node also present  Psychiatric: He has a normal mood and affect. His behavior is normal.          Assessment & Plan:    Cellulitis left lower arm. Multiple antibiotic allergies we'll treat with Avelox for 7 days. Samples provided Hypertension stable Polymyalgia rheumatica. Hypothyroidism Vitamin B12 deficiency. Patient was instructed to resume vitamin B12 1 mg daily

## 2011-04-29 NOTE — Patient Instructions (Signed)
Keep left arm elevated as much as possible  Take your antibiotic as prescribed until ALL of it is gone, but stop if you develop a rash, swelling, or any side effects of the medication.  Contact our office as soon as possible if  there are side effects of the medication.  Return in 3 months for follow-up  Vitamin B12 1 mg daily

## 2011-05-05 ENCOUNTER — Telehealth: Payer: Self-pay | Admitting: Cardiology

## 2011-05-05 MED ORDER — METOPROLOL TARTRATE 25 MG PO TABS: 25.0000 mg | ORAL_TABLET | Freq: Every day | ORAL | Status: DC

## 2011-05-05 NOTE — Telephone Encounter (Signed)
Metoprolol 25mg  refill, uses [facility] [address]

## 2011-05-17 ENCOUNTER — Other Ambulatory Visit: Payer: Self-pay | Admitting: Gastroenterology

## 2011-05-25 ENCOUNTER — Encounter: Payer: Self-pay | Admitting: Cardiology

## 2011-05-25 ENCOUNTER — Ambulatory Visit (INDEPENDENT_AMBULATORY_CARE_PROVIDER_SITE_OTHER): Payer: Medicare Other | Admitting: Cardiology

## 2011-05-25 VITALS — BP 140/75 | HR 65 | Ht 70.0 in | Wt 183.0 lb

## 2011-05-25 DIAGNOSIS — I2510 Atherosclerotic heart disease of native coronary artery without angina pectoris: Secondary | ICD-10-CM | POA: Diagnosis not present

## 2011-05-25 MED ORDER — ATORVASTATIN CALCIUM 40 MG PO TABS: 40.0000 mg | ORAL_TABLET | Freq: Every day | ORAL | Status: DC

## 2011-05-25 NOTE — Assessment & Plan Note (Signed)
The patient has no new sypmtoms.  No further cardiovascular testing is indicated.  We will continue with aggressive risk reduction and meds as listed.  

## 2011-05-25 NOTE — Progress Notes (Signed)
HPI The patient presents for one-year followup. Since I last saw him he has had no new cardiovascular problems. He's had some problems with swallowing and facial swelling and is found to have apparently rheumatoid arthritis and polymyalgia. He is still being treated with steroids. He still able to be very active. The patient denies any new symptoms such as chest discomfort, neck or arm discomfort. There has been no new shortness of breath, PND or orthopnea. There have been no reported palpitations, presyncope or syncope.  Allergies  Allergen Reactions  . Amoxicillin     REACTION: unspecified  . Doxycycline   . Erythromycin     REACTION: unspecified  . Penicillins   . Sulfamethoxazole W/Trimethoprim     Current Outpatient Prescriptions  Medication Sig Dispense Refill  . aspirin 81 MG tablet Take 81 mg by mouth daily. ON HOLD      . atorvastatin (LIPITOR) 40 MG tablet Take 40 mg by mouth 2 (two) times daily.      . cyanocobalamin ([facility] VITAMIN B12) 1000 MCG tablet Take 1 tablet (1,000 mcg total) by mouth daily.  90 tablet  6  . desonide (DESOWEN) 0.05 % ointment Apply topically as needed.      . metoprolol tartrate (LOPRESSOR) 25 MG tablet Take 1 tablet (25 mg total) by mouth daily.  30 tablet  0  . NEXIUM 40 MG capsule TAKE 1 CAPSULE (40 MG TOTAL) BY MOUTH 2 (TWO) TIMES DAILY.  60 capsule  11  . nitroGLYCERIN (NITROSTAT) 0.4 MG SL tablet Place 0.4 mg under the tongue every 5 (five) minutes as needed.        Marland Kitchen PREDNISONE PO Take by mouth. Takes 4 mg once daily      . Pyrithione Zinc 2 % SHAM Apply topically as needed.        Past Medical History  Diagnosis Date  . IBS (irritable bowel syndrome)   . Hemorrhoids   . Diverticulosis   . GERD (gastroesophageal reflux disease)   . Tubular adenoma of colon 07/1998  . Anxiety and depression   . Elevated prostate specific antigen (PSA)   . Vitamin B12 deficiency   . UTI (lower urinary tract infection)   . Arthritis   . Sciatica   .  Anemia   . TIA (transient ischemic attack)   . Peptic ulcer disease   . Hyperlipidemia   . CAD (coronary artery disease)     PCI 2006, Stress Perfusion Study 2011  . Hypertension     Past Surgical History  Procedure Date  . Ptca 2006    Stenting of proximal LAD w/ 75 % stenosis reduced to 0%  . Cataract extraction   . Cholecystectomy 2001  . Hernia repair   . Hip fracture surgery 03/2007    Right  . Salivary stone removal 11/2007    ROS:  As stated in the HPI and negative for all other systems.  PHYSICAL EXAM BP 140/75  Pulse 65  Ht 5\' 10"  (1.778 m)  Wt 183 lb (83.008 kg)  BMI 26.26 kg/m2 GENERAL:  Well appearing HEENT:  Pupils equal round and reactive, fundi not visualized, oral mucosa unremarkable, right facial swelling NECK:  No jugular venous distention, waveform within normal limits, carotid upstroke brisk and symmetric, no bruits, no thyromegaly LYMPHATICS:  No cervical, inguinal adenopathy LUNGS:  Clear to auscultation bilaterally BACK:  No CVA tenderness CHEST:  Unremarkable HEART:  PMI not displaced or sustained,S1 and S2 within normal limits, no S3, no S4,  no clicks, no rubs, no murmurs ABD:  Flat, positive bowel sounds normal in frequency in pitch, no bruits, no rebound, no guarding, no midline pulsatile mass, no hepatomegaly, no splenomegaly EXT:  2 plus pulses throughout, no edema, no cyanosis no clubbing   EKG:  Sinus rhythm, rate 65, LVH by voltage criteria, no acute ST T wave changes.  05/25/2011   ASSESSMENT AND PLAN

## 2011-05-25 NOTE — Assessment & Plan Note (Signed)
He is due to have a lipid profile in a few days.  I will be happy to review this with an LDL goal less than 100.

## 2011-05-25 NOTE — Patient Instructions (Signed)
The current medical regimen is effective;  continue present plan and medications.  Follow up in 1 year with Dr Hochrein.  You will receive a letter in the mail 2 months before you are due.  Please call us when you receive this letter to schedule your follow up appointment.  

## 2011-05-28 ENCOUNTER — Ambulatory Visit (INDEPENDENT_AMBULATORY_CARE_PROVIDER_SITE_OTHER): Payer: Medicare Other | Admitting: Internal Medicine

## 2011-05-28 ENCOUNTER — Encounter: Payer: Self-pay | Admitting: Internal Medicine

## 2011-05-28 DIAGNOSIS — I2510 Atherosclerotic heart disease of native coronary artery without angina pectoris: Secondary | ICD-10-CM

## 2011-05-28 DIAGNOSIS — M2550 Pain in unspecified joint: Secondary | ICD-10-CM

## 2011-05-28 DIAGNOSIS — E785 Hyperlipidemia, unspecified: Secondary | ICD-10-CM

## 2011-05-28 DIAGNOSIS — M353 Polymyalgia rheumatica: Secondary | ICD-10-CM | POA: Diagnosis not present

## 2011-05-28 DIAGNOSIS — D649 Anemia, unspecified: Secondary | ICD-10-CM | POA: Diagnosis not present

## 2011-05-28 DIAGNOSIS — Z0000 Encounter for general adult medical examination without abnormal findings: Secondary | ICD-10-CM

## 2011-05-28 DIAGNOSIS — E039 Hypothyroidism, unspecified: Secondary | ICD-10-CM | POA: Diagnosis not present

## 2011-05-28 DIAGNOSIS — E538 Deficiency of other specified B group vitamins: Secondary | ICD-10-CM

## 2011-05-28 LAB — COMPREHENSIVE METABOLIC PANEL
BUN: 19 mg/dL (ref 6–23)
CO2: 29 mEq/L (ref 19–32)
Creatinine, Ser: 1.3 mg/dL (ref 0.4–1.5)
GFR: 57.55 mL/min — ABNORMAL LOW (ref >60.00)
Glucose, Bld: 85 mg/dL (ref 70–99)
Total Bilirubin: 0.6 mg/dL (ref 0.3–1.2)

## 2011-05-28 LAB — CBC WITH DIFFERENTIAL/PLATELET
Eosinophils Relative: 2.4 % (ref 0.0–5.0)
HCT: 42.8 % (ref 39.0–52.0)
Hemoglobin: 14.2 g/dL (ref 13.0–17.0)
Lymphocytes Relative: 28.2 % (ref 12.0–46.0)
Lymphs Abs: 1.7 10*3/uL (ref 0.7–4.0)
Monocytes Relative: 7.8 % (ref 3.0–12.0)
Neutro Abs: 3.6 10*3/uL (ref 1.4–7.7)
RBC: 4.71 Mil/uL (ref 4.22–5.81)
WBC: 6 10*3/uL (ref 4.5–10.5)

## 2011-05-28 LAB — LIPID PANEL
HDL: 43.9 mg/dL (ref >39.00)
LDL Cholesterol: 72 mg/dL (ref 0–99)
Total CHOL/HDL Ratio: 3
Triglycerides: 104 mg/dL (ref 0.0–149.0)
VLDL: 20.8 mg/dL (ref 0.0–40.0)

## 2011-05-28 MED ORDER — ESOMEPRAZOLE MAGNESIUM 40 MG PO CPDR: 40.0000 mg | DELAYED_RELEASE_CAPSULE | Freq: Every day | ORAL | Status: DC

## 2011-05-28 NOTE — Patient Instructions (Signed)
Limit your sodium (Salt) intake    It is important that you exercise regularly, at least 20 minutes 3 to 4 times per week.  If you develop chest pain or shortness of breath seek  medical attention.  Return in 6 months for follow-up  

## 2011-05-28 NOTE — Progress Notes (Signed)
Subjective:    Patient ID: [name], male    DOB: 1922-11-06, 78 y.o.   MRN: [id number]  HPI   78 year old patient who is in today for a preventive health examination. He is followed by cardiology  For  coronary artery disease and also by rheumatology due to the polymyalgia rheumatica. He does remarkably well at 78.  1. Risk factors, based on past  M,S,F history-  patient has known coronary artery disease. Cardiac risk factors include dyslipidemia and hypertension  2.  Physical activities: Remains fairly active considering his age  13.  Depression/mood: No history of depression or mood disorder  4.  Hearing: Minor deficits only  5.  ADL's: Remains independent in all aspects of daily living  6.  Fall risk: Moderate due to age and visual disturbance  7.  Home safety: No problems identified 8.  Height weight, and visual acuity; height and weight stable. Followed closely by ophthalmology due to macular degeneration 9.  Counseling: Low-salt heart healthy diet and regular exercise encouraged  10. Lab orders based on risk factors: Temperature profile will be reviewed 11. Referral : Followup ophthalmology and cardiology as well as rheumatology  12. Care plan: Heart healthy diet regular exercise  13. Cognitive assessment: Alert and appropriate does have some mild age-related very mild cognitive impairment    Past Medical History  Diagnosis Date  . IBS (irritable bowel syndrome)   . Hemorrhoids   . Diverticulosis   . GERD (gastroesophageal reflux disease)   . Tubular adenoma of colon 07/1998  . Anxiety and depression   . Elevated prostate specific antigen (PSA)   . Vitamin B12 deficiency   . UTI (lower urinary tract infection)   . Arthritis   . Sciatica   . Anemia   . TIA (transient ischemic attack)   . Peptic ulcer disease   . Hyperlipidemia   . CAD (coronary artery disease)     PCI 2006, Stress Perfusion Study 2011  . Hypertension     History   Social History  .  Marital Status: Married    Spouse Name: N/A    Number of Children: N/A  . Years of Education: N/A   Occupational History  . Retired    Social History Main Topics  . Smoking status: Former Smoker    Types: Cigarettes    Quit date: 03/29/1972  . Smokeless tobacco: Never Used  . Alcohol Use: Yes     occasional  . Drug Use: No  . Sexually Active: Not Currently   Other Topics Concern  . Not on file   Social History Narrative  . No narrative on file    Past Surgical History  Procedure Date  . Ptca 2006    Stenting of proximal LAD w/ 75 % stenosis reduced to 0%  . Cataract extraction   . Cholecystectomy 2001  . Hernia repair   . Hip fracture surgery 03/2007    Right  . Salivary stone removal 11/2007    Family History  Problem Relation Age of Onset  . Colon cancer Maternal Uncle 50    ish  . Coronary artery disease Father     Allergies  Allergen Reactions  . Amoxicillin     REACTION: unspecified  . Doxycycline   . Erythromycin     REACTION: unspecified  . Penicillins   . Sulfamethoxazole W/Trimethoprim     Current Outpatient Prescriptions on File Prior to Visit  Medication Sig Dispense Refill  . atorvastatin (LIPITOR) 40 MG tablet  Take 1 tablet (40 mg total) by mouth daily.  30 tablet  11  . cyanocobalamin ([facility] VITAMIN B12) 1000 MCG tablet Take 1 tablet (1,000 mcg total) by mouth daily.  90 tablet  6  . desonide (DESOWEN) 0.05 % ointment Apply topically as needed.      . metoprolol tartrate (LOPRESSOR) 25 MG tablet Take 1 tablet (25 mg total) by mouth daily.  30 tablet  0  . NEXIUM 40 MG capsule TAKE 1 CAPSULE (40 MG TOTAL) BY MOUTH 2 (TWO) TIMES DAILY.  60 capsule  11  . nitroGLYCERIN (NITROSTAT) 0.4 MG SL tablet Place 0.4 mg under the tongue every 5 (five) minutes as needed.        Marland Kitchen PREDNISONE PO Take by mouth. Takes 3 mg once daily - tapering      . Pyrithione Zinc 2 % SHAM Apply topically as needed.        BP 140/90  Pulse 70  Temp(Src) 97.6 F (36.4  C) (Oral)  Resp 16  Ht 5\' 10"  (1.778 m)  Wt 185 lb (83.915 kg)  BMI 26.54 kg/m2  SpO2 96%     Review of Systems  Constitutional: Negative for fever, chills, activity change, appetite change and fatigue.  HENT: Positive for ear pain. Negative for hearing loss, congestion, rhinorrhea, sneezing, mouth sores, trouble swallowing, neck pain, neck stiffness, dental problem, voice change, sinus pressure and tinnitus.   Eyes: Positive for visual disturbance. Negative for photophobia, pain and redness.  Respiratory: Negative for apnea, cough, choking, chest tightness, shortness of breath and wheezing.   Cardiovascular: Negative for chest pain, palpitations and leg swelling.  Gastrointestinal: Negative for nausea, vomiting, abdominal pain, diarrhea, constipation, blood in stool, abdominal distention, anal bleeding and rectal pain.  Genitourinary: Negative for dysuria, urgency, frequency, hematuria, flank pain, decreased urine volume, discharge, penile swelling, scrotal swelling, difficulty urinating, genital sores and testicular pain.  Musculoskeletal: Positive for myalgias. Negative for back pain, joint swelling, arthralgias and gait problem.  Skin: Negative for color change, rash and wound.  Neurological: Negative for dizziness, tremors, seizures, syncope, facial asymmetry, speech difficulty, weakness, light-headedness, numbness and headaches.  Hematological: Negative for adenopathy. Does not bruise/bleed easily.  Psychiatric/Behavioral: Negative for suicidal ideas, hallucinations, behavioral problems, confusion, sleep disturbance, self-injury, dysphoric mood, decreased concentration and agitation. The patient is not nervous/anxious.        Objective:   Physical Exam  Constitutional: He appears well-developed and well-nourished.       Blood pressure 130/82  HENT:  Head: Normocephalic and atraumatic.  Right Ear: External ear normal.  Left Ear: External ear normal.  Nose: Nose normal.    Mouth/Throat: Oropharynx is clear and moist.       Edentulous  Eyes: Conjunctivae and EOM are normal. Pupils are equal, round, and reactive to light. No scleral icterus.  Neck: Normal range of motion. Neck supple. No JVD present. No thyromegaly present.  Cardiovascular: Regular rhythm, normal heart sounds and intact distal pulses.  Exam reveals no gallop and no friction rub.   No murmur heard. Pulmonary/Chest: Effort normal and breath sounds normal. He exhibits no tenderness.  Abdominal: Soft. Bowel sounds are normal. He exhibits no distension and no mass. There is no tenderness.  Genitourinary: Rectum normal, prostate normal and penis normal. No penile tenderness.  Musculoskeletal: Normal range of motion. He exhibits no edema and no tenderness.  Lymphadenopathy:    He has no cervical adenopathy.  Neurological: He is alert. He has normal reflexes. No cranial nerve deficit.  Coordination normal.       Mild decreased vibratory sensation distally  Skin: Skin is warm and dry. No rash noted.  Psychiatric: He has a normal mood and affect. His behavior is normal.          Assessment & Plan:   Preventive health examination  Polymyalgia rheumatica. Continue slow prednisone taper per rheumatology Coronary artery disease stable Dyslipidemia. Continue atorvastatin  Recheck 6 months Laboratory update will be reviewed

## 2011-06-08 ENCOUNTER — Telehealth: Payer: Self-pay | Admitting: *Deleted

## 2011-06-08 NOTE — Telephone Encounter (Signed)
Pt's wife is asking for lab results and to have a copy mailed to the pt, please.

## 2011-06-08 NOTE — Telephone Encounter (Signed)
Pt aware labs normal - copy mailed

## 2011-06-14 ENCOUNTER — Telehealth: Payer: Self-pay | Admitting: Gastroenterology

## 2011-06-14 NOTE — Telephone Encounter (Signed)
Patient having problems with cough and mucus.  He is advised to see his primary care MD or his ENT.

## 2011-06-15 DIAGNOSIS — K219 Gastro-esophageal reflux disease without esophagitis: Secondary | ICD-10-CM | POA: Diagnosis not present

## 2011-06-15 DIAGNOSIS — J301 Allergic rhinitis due to pollen: Secondary | ICD-10-CM | POA: Diagnosis not present

## 2011-06-28 ENCOUNTER — Telehealth: Payer: Self-pay | Admitting: Gastroenterology

## 2011-06-28 ENCOUNTER — Telehealth: Payer: Self-pay | Admitting: Cardiology

## 2011-06-28 MED ORDER — ATORVASTATIN CALCIUM 40 MG PO TABS: 40.0000 mg | ORAL_TABLET | Freq: Every day | ORAL | Status: DC

## 2011-06-28 MED ORDER — NITROGLYCERIN 0.4 MG SL SUBL: 0.4000 mg | SUBLINGUAL_TABLET | SUBLINGUAL | Status: DC | PRN

## 2011-06-28 MED ORDER — METOPROLOL SUCCINATE ER 25 MG PO TB24: 25.0000 mg | ORAL_TABLET | Freq: Every day | ORAL | Status: DC

## 2011-06-28 MED ORDER — ESOMEPRAZOLE MAGNESIUM 40 MG PO CPDR: 40.0000 mg | DELAYED_RELEASE_CAPSULE | Freq: Every day | ORAL | Status: DC

## 2011-06-28 NOTE — Telephone Encounter (Signed)
Printed prescription for patient to pick and take to his pharmacy.

## 2011-06-28 NOTE — Telephone Encounter (Signed)
Please return call to patient regarding medication, he can be reached at [phone].   Patient Requests written prescriptions for medications.

## 2011-06-28 NOTE — Telephone Encounter (Signed)
No answer no voice mail.  Will continue to attempt to call pt

## 2011-06-28 NOTE — Telephone Encounter (Signed)
pts insurance had been cancelled and he needed new RX to be sent into pharmacy.  Send as requested.

## 2011-07-01 ENCOUNTER — Telehealth: Payer: Self-pay | Admitting: Cardiology

## 2011-07-01 NOTE — Telephone Encounter (Signed)
Answered pts questions about Metoprolol

## 2011-07-01 NOTE — Telephone Encounter (Signed)
Please return call to patient at [phone] regarding medication instructions

## 2011-07-13 DIAGNOSIS — R51 Headache: Secondary | ICD-10-CM | POA: Diagnosis not present

## 2011-07-13 DIAGNOSIS — M353 Polymyalgia rheumatica: Secondary | ICD-10-CM | POA: Diagnosis not present

## 2011-07-13 DIAGNOSIS — H532 Diplopia: Secondary | ICD-10-CM | POA: Diagnosis not present

## 2011-07-19 DIAGNOSIS — R51 Headache: Secondary | ICD-10-CM | POA: Diagnosis not present

## 2011-07-19 DIAGNOSIS — H532 Diplopia: Secondary | ICD-10-CM | POA: Diagnosis not present

## 2011-07-19 DIAGNOSIS — M353 Polymyalgia rheumatica: Secondary | ICD-10-CM | POA: Diagnosis not present

## 2011-07-20 DIAGNOSIS — H532 Diplopia: Secondary | ICD-10-CM | POA: Diagnosis not present

## 2011-07-23 ENCOUNTER — Other Ambulatory Visit: Payer: Self-pay | Admitting: Otolaryngology

## 2011-07-23 DIAGNOSIS — M316 Other giant cell arteritis: Secondary | ICD-10-CM | POA: Diagnosis not present

## 2011-07-23 DIAGNOSIS — H579 Unspecified disorder of eye and adnexa: Secondary | ICD-10-CM | POA: Diagnosis not present

## 2011-07-23 DIAGNOSIS — D210 Benign neoplasm of connective and other soft tissue of head, face and neck: Secondary | ICD-10-CM | POA: Diagnosis not present

## 2011-08-05 DIAGNOSIS — Z961 Presence of intraocular lens: Secondary | ICD-10-CM | POA: Diagnosis not present

## 2011-08-05 DIAGNOSIS — H353190 Nonexudative age-related macular degeneration, unspecified eye, stage unspecified: Secondary | ICD-10-CM | POA: Diagnosis not present

## 2011-08-05 DIAGNOSIS — H43819 Vitreous degeneration, unspecified eye: Secondary | ICD-10-CM | POA: Diagnosis not present

## 2011-08-05 DIAGNOSIS — H353290 Exudative age-related macular degeneration, unspecified eye, stage unspecified: Secondary | ICD-10-CM | POA: Diagnosis not present

## 2011-08-17 ENCOUNTER — Ambulatory Visit (INDEPENDENT_AMBULATORY_CARE_PROVIDER_SITE_OTHER): Payer: Medicare Other | Admitting: Internal Medicine

## 2011-08-17 ENCOUNTER — Encounter: Payer: Self-pay | Admitting: Internal Medicine

## 2011-08-17 VITALS — BP 124/70 | Temp 100.1°F | Wt 182.0 lb

## 2011-08-17 DIAGNOSIS — R509 Fever, unspecified: Secondary | ICD-10-CM | POA: Diagnosis not present

## 2011-08-17 DIAGNOSIS — M353 Polymyalgia rheumatica: Secondary | ICD-10-CM

## 2011-08-17 NOTE — Progress Notes (Signed)
  Subjective:    Patient ID: [name], male    DOB: 1923/03/11, 78 y.o.   MRN: [id number]  HPI  78 year old patient who is seen today with a chief complaint of fever of one days duration associated with intermittent bilateral ear pain. He also states that he has felt a bit unsteady. He has a history of polymyalgia rheumatica and apparently had been on prednisone therapy for over one year more recently he was treated with prednisone 60 mg daily for 2 weeks apparently to treat diplopia.  Prednisone therapy was rapidly tapered and he has been off for approximately one week;  denies any cough. No chills    Review of Systems  Constitutional: Positive for fever, appetite change and fatigue. Negative for chills.  HENT: Positive for ear pain. Negative for hearing loss, congestion, sore throat, trouble swallowing, neck stiffness, dental problem, voice change and tinnitus.   Eyes: Negative for pain, discharge and visual disturbance.  Respiratory: Negative for cough, chest tightness, wheezing and stridor.   Cardiovascular: Negative for chest pain, palpitations and leg swelling.  Gastrointestinal: Negative for nausea, vomiting, abdominal pain, diarrhea, constipation, blood in stool and abdominal distention.  Genitourinary: Negative for urgency, hematuria, flank pain, discharge, difficulty urinating and genital sores.  Musculoskeletal: Negative for myalgias, back pain, joint swelling, arthralgias and gait problem.  Skin: Negative for rash.  Neurological: Negative for dizziness, syncope, speech difficulty, weakness, numbness and headaches.  Hematological: Negative for adenopathy. Does not bruise/bleed easily.  Psychiatric/Behavioral: Negative for behavioral problems and dysphoric mood. The patient is not nervous/anxious.        Objective:   Physical Exam  Constitutional: He is oriented to person, place, and time. He appears well-developed and well-nourished. No distress.       Temperature  100.1 Appears unwell but alert and in no distress Blood pressure low normal No tachycardia  HENT:  Head: Normocephalic.  Right Ear: External ear normal.  Left Ear: External ear normal.  Mouth/Throat: Oropharynx is clear and moist.       Tympanic membranes and canals appeared normal  Eyes: Conjunctivae and EOM are normal.  Neck: Normal range of motion. Neck supple.  Cardiovascular: Normal rate, regular rhythm and normal heart sounds.        No tachycardia  Pulmonary/Chest: Breath sounds normal.  Abdominal: Bowel sounds are normal.  Musculoskeletal: Normal range of motion. He exhibits no edema and no tenderness.  Lymphadenopathy:    He has no cervical adenopathy.  Neurological: He is alert and oriented to person, place, and time.  Psychiatric: He has a normal mood and affect. His behavior is normal.          Assessment & Plan:   Probable viral syndrome History of PMR and recent prednisone use  We'll treat with Tylenol. We'll also resume prednisone 10 mg daily for 7 days. We'll reassess in one week or sooner if any worsening

## 2011-08-17 NOTE — Patient Instructions (Signed)
Return office visit in 7 days Take prednisone 10 mg daily  Tylenol 1-2 tabs po q4h prn fever or discomfort

## 2011-08-24 ENCOUNTER — Ambulatory Visit (INDEPENDENT_AMBULATORY_CARE_PROVIDER_SITE_OTHER): Payer: Medicare Other | Admitting: Internal Medicine

## 2011-08-24 ENCOUNTER — Encounter: Payer: Self-pay | Admitting: Internal Medicine

## 2011-08-24 VITALS — BP 110/68 | Temp 97.9°F | Wt 182.0 lb

## 2011-08-24 DIAGNOSIS — R5381 Other malaise: Secondary | ICD-10-CM | POA: Diagnosis not present

## 2011-08-24 DIAGNOSIS — R5383 Other fatigue: Secondary | ICD-10-CM

## 2011-08-24 DIAGNOSIS — M353 Polymyalgia rheumatica: Secondary | ICD-10-CM | POA: Diagnosis not present

## 2011-08-24 NOTE — Progress Notes (Signed)
  Subjective:    Patient ID: [name], male    DOB: Jan 16, 1923, 78 y.o.   MRN: [id number]  HPI  78 year old patient who is seen today in followup. He was seen one week with a suspected viral illness. He has a history of polymyalgia rheumatica. He was treated with the prednisone 10 mg daily and Tylenol. Today he feels much improved he recently discontinued prednisone use. He has had a quite active weekend with graduations and has done quite well. No fever.    Review of Systems  Constitutional: Positive for fatigue. Negative for fever, chills and appetite change.  HENT: Negative for hearing loss, ear pain, congestion, sore throat, trouble swallowing, neck stiffness, dental problem, voice change and tinnitus.   Eyes: Negative for pain, discharge and visual disturbance.  Respiratory: Negative for cough, chest tightness, wheezing and stridor.   Cardiovascular: Negative for chest pain, palpitations and leg swelling.  Gastrointestinal: Negative for nausea, vomiting, abdominal pain, diarrhea, constipation, blood in stool and abdominal distention.  Genitourinary: Negative for urgency, hematuria, flank pain, discharge, difficulty urinating and genital sores.  Musculoskeletal: Negative for myalgias, back pain, joint swelling, arthralgias and gait problem.  Skin: Negative for rash.  Neurological: Negative for dizziness, syncope, speech difficulty, weakness, numbness and headaches.  Hematological: Negative for adenopathy. Does not bruise/bleed easily.  Psychiatric/Behavioral: Negative for behavioral problems and dysphoric mood. The patient is not nervous/anxious.        Objective:   Physical Exam  Constitutional: He is oriented to person, place, and time. He appears well-developed.  HENT:  Head: Normocephalic.  Right Ear: External ear normal.  Left Ear: External ear normal.  Eyes: Conjunctivae and EOM are normal.  Neck: Normal range of motion.  Cardiovascular: Normal rate and normal heart  sounds.   Pulmonary/Chest: Breath sounds normal.  Abdominal: Bowel sounds are normal.  Musculoskeletal: Normal range of motion. He exhibits no edema and no tenderness.  Neurological: He is alert and oriented to person, place, and time.  Psychiatric: He has a normal mood and affect. His behavior is normal.          Assessment & Plan:   Status post viral illness History of polymyalgia  Rheumatica.  We'll taper and discontinue prednisone over the next 2 weeks. He will call if unimproved area otherwise he will be seen in followup in 3 months

## 2011-08-24 NOTE — Patient Instructions (Signed)
Decrease prednisone to 5 mg daily for 7 days;  then decrease to 5 mg every other day for an additional 7 days  Call or return to clinic prn if these symptoms worsen or fail to improve as anticipated.

## 2011-09-16 DIAGNOSIS — H01009 Unspecified blepharitis unspecified eye, unspecified eyelid: Secondary | ICD-10-CM | POA: Diagnosis not present

## 2011-09-16 DIAGNOSIS — H04129 Dry eye syndrome of unspecified lacrimal gland: Secondary | ICD-10-CM | POA: Diagnosis not present

## 2011-09-22 ENCOUNTER — Telehealth: Payer: Self-pay | Admitting: Internal Medicine

## 2011-09-22 ENCOUNTER — Ambulatory Visit (INDEPENDENT_AMBULATORY_CARE_PROVIDER_SITE_OTHER): Payer: Medicare Other | Admitting: Internal Medicine

## 2011-09-22 ENCOUNTER — Encounter: Payer: Self-pay | Admitting: Internal Medicine

## 2011-09-22 VITALS — BP 134/60 | HR 76 | Temp 98.0°F | Wt 180.0 lb

## 2011-09-22 DIAGNOSIS — F341 Dysthymic disorder: Secondary | ICD-10-CM

## 2011-09-22 DIAGNOSIS — M353 Polymyalgia rheumatica: Secondary | ICD-10-CM

## 2011-09-22 NOTE — Telephone Encounter (Signed)
Caller: [facility]/Patient; PCP: Eleonore Chiquito; CB#: [phone]; ; ; Call regarding Patient Found Blood On His Leg This AM, Not Sure Where It Came From; Patient reports that there is a spot on the back of his left leg that was open and bleeding last night 09/21/11 and this morning is still oozing blood at this time. Patient denies any injury; reports when he touches the area he feels pain; Emergent s/s of "New onset mild to moderate pain that has not improved with 24 hours of home care" positive per Leg Non-Injury guideline. Appt scheduled with Dr. Amador Cunas at 10:15am 09/22/11.

## 2011-09-22 NOTE — Progress Notes (Signed)
  Subjective:    Patient ID: DEMETRICK EICHENBERGER, male    DOB: 08/30/1922, 78 y.o.   MRN: [id number]  HPI  78 year old patient who is seen today to evaluate a laceration involving his left posterior lower leg. He probably developed a fairly minor skin tear that bled profusely. He has been on prednisone therapy which has been tapered and discontinued over the past 2 weeks. He generally feels well. His only complaint is some intermittent bilateral ear pain    Review of Systems  Skin: Positive for wound.       Objective:   Physical Exam  Constitutional: He appears well-developed and well-nourished.  HENT:       Tympanic membranes normal  Skin:       A small superficial laceration consistent with a skin tear noted involving the left posterior lower leg. Antibiotic ointment applied and the wound dressed          Assessment & Plan:   Skin care History polymyalgia rheumatica. We'll check a sedimentation rate noted the patient is off prednisone we'll continue to observe

## 2011-09-22 NOTE — Patient Instructions (Addendum)
Limit your sodium (Salt) intake  Keep laceration involving your  leg clean and dry  Return in 6 months for follow-up  Call or return to clinic prn if these symptoms worsen or fail to improve as anticipated.

## 2011-09-23 LAB — SEDIMENTATION RATE: Sed Rate: 28 mm/hr — ABNORMAL HIGH (ref 0–22)

## 2011-10-18 DIAGNOSIS — H353290 Exudative age-related macular degeneration, unspecified eye, stage unspecified: Secondary | ICD-10-CM | POA: Diagnosis not present

## 2011-10-18 DIAGNOSIS — H353190 Nonexudative age-related macular degeneration, unspecified eye, stage unspecified: Secondary | ICD-10-CM | POA: Diagnosis not present

## 2011-10-18 DIAGNOSIS — Z961 Presence of intraocular lens: Secondary | ICD-10-CM | POA: Diagnosis not present

## 2011-11-04 DIAGNOSIS — H43819 Vitreous degeneration, unspecified eye: Secondary | ICD-10-CM | POA: Diagnosis not present

## 2011-11-04 DIAGNOSIS — H353190 Nonexudative age-related macular degeneration, unspecified eye, stage unspecified: Secondary | ICD-10-CM | POA: Diagnosis not present

## 2011-11-04 DIAGNOSIS — Z961 Presence of intraocular lens: Secondary | ICD-10-CM | POA: Diagnosis not present

## 2011-11-04 DIAGNOSIS — H353290 Exudative age-related macular degeneration, unspecified eye, stage unspecified: Secondary | ICD-10-CM | POA: Diagnosis not present

## 2011-11-22 DIAGNOSIS — M353 Polymyalgia rheumatica: Secondary | ICD-10-CM | POA: Diagnosis not present

## 2011-11-22 DIAGNOSIS — H532 Diplopia: Secondary | ICD-10-CM | POA: Diagnosis not present

## 2011-11-22 DIAGNOSIS — R51 Headache: Secondary | ICD-10-CM | POA: Diagnosis not present

## 2011-11-30 ENCOUNTER — Encounter: Payer: Self-pay | Admitting: Internal Medicine

## 2011-11-30 ENCOUNTER — Ambulatory Visit (INDEPENDENT_AMBULATORY_CARE_PROVIDER_SITE_OTHER): Payer: Medicare Other | Admitting: Internal Medicine

## 2011-11-30 VITALS — BP 120/80 | Temp 98.1°F | Wt 180.0 lb

## 2011-11-30 DIAGNOSIS — I2510 Atherosclerotic heart disease of native coronary artery without angina pectoris: Secondary | ICD-10-CM | POA: Diagnosis not present

## 2011-11-30 DIAGNOSIS — E785 Hyperlipidemia, unspecified: Secondary | ICD-10-CM | POA: Diagnosis not present

## 2011-11-30 DIAGNOSIS — M353 Polymyalgia rheumatica: Secondary | ICD-10-CM

## 2011-11-30 DIAGNOSIS — E039 Hypothyroidism, unspecified: Secondary | ICD-10-CM | POA: Diagnosis not present

## 2011-11-30 NOTE — Progress Notes (Signed)
Subjective:    Patient ID: [name], male    DOB: July 13, 1922, 78 y.o.   MRN: [id number]  HPI  78 year old patient who is seen today for his bimonthly followup. He is also followed by rheumatology for polymyalgia rheumatica and has been resumed on prednisone 5 mg daily. He presently is doing quite well on this dose. He has dyslipidemia and hypothyroidism as well as coronary artery disease. In general he has done quite well. No new concerns or complaints  Past Medical History  Diagnosis Date  . IBS (irritable bowel syndrome)   . Hemorrhoids   . Diverticulosis   . GERD (gastroesophageal reflux disease)   . Tubular adenoma of colon 07/1998  . Anxiety and depression   . Elevated prostate specific antigen (PSA)   . Vitamin B12 deficiency   . UTI (lower urinary tract infection)   . Arthritis   . Sciatica   . Anemia   . TIA (transient ischemic attack)   . Peptic ulcer disease   . Hyperlipidemia   . CAD (coronary artery disease)     PCI 2006, Stress Perfusion Study 2011  . Hypertension     History   Social History  . Marital Status: Married    Spouse Name: N/A    Number of Children: N/A  . Years of Education: N/A   Occupational History  . Retired    Social History Main Topics  . Smoking status: Former Smoker    Types: Cigarettes    Quit date: 03/29/1972  . Smokeless tobacco: Never Used  . Alcohol Use: Yes     occasional  . Drug Use: No  . Sexually Active: Not Currently   Other Topics Concern  . Not on file   Social History Narrative  . No narrative on file    Past Surgical History  Procedure Date  . Ptca 2006    Stenting of proximal LAD w/ 75 % stenosis reduced to 0%  . Cataract extraction   . Cholecystectomy 2001  . Hernia repair   . Hip fracture surgery 03/2007    Right  . Salivary stone removal 11/2007    Family History  Problem Relation Age of Onset  . Colon cancer Maternal Uncle 50    ish  . Coronary artery disease Father     Allergies    Allergen Reactions  . Amoxicillin     REACTION: unspecified  . Doxycycline   . Erythromycin     REACTION: unspecified  . Penicillins   . Sulfamethoxazole W-Trimethoprim     Current Outpatient Prescriptions on File Prior to Visit  Medication Sig Dispense Refill  . aspirin 81 MG tablet Take 81 mg by mouth daily.      Marland Kitchen atorvastatin (LIPITOR) 40 MG tablet Take 1 tablet (40 mg total) by mouth daily.  90 tablet  2  . desonide (DESOWEN) 0.05 % ointment Apply topically as needed.      Marland Kitchen esomeprazole (NEXIUM) 40 MG capsule Take 1 capsule (40 mg total) by mouth daily.  90 capsule  3  . metoprolol succinate (TOPROL-XL) 25 MG 24 hr tablet Take 1 tablet (25 mg total) by mouth daily.  90 tablet  2  . nitroGLYCERIN (NITROSTAT) 0.4 MG SL tablet Place 1 tablet (0.4 mg total) under the tongue every 5 (five) minutes as needed.  25 tablet  11  . Pyrithione Zinc 2 % SHAM Apply topically as needed.        BP 120/80  Temp 98.1 F (36.7  C) (Oral)  Wt 180 lb (81.647 kg)       Review of Systems  Constitutional: Negative for fever, chills, appetite change and fatigue.  HENT: Negative for hearing loss, ear pain, congestion, sore throat, trouble swallowing, neck stiffness, dental problem, voice change and tinnitus.   Eyes: Negative for pain, discharge and visual disturbance.  Respiratory: Negative for cough, chest tightness, wheezing and stridor.   Cardiovascular: Negative for chest pain, palpitations and leg swelling.  Gastrointestinal: Negative for nausea, vomiting, abdominal pain, diarrhea, constipation, blood in stool and abdominal distention.  Genitourinary: Negative for urgency, hematuria, flank pain, discharge, difficulty urinating and genital sores.  Musculoskeletal: Positive for arthralgias. Negative for myalgias, back pain, joint swelling and gait problem.  Skin: Negative for rash.  Neurological: Negative for dizziness, syncope, speech difficulty, weakness, numbness and headaches.   Hematological: Negative for adenopathy. Does not bruise/bleed easily.  Psychiatric/Behavioral: Negative for behavioral problems and dysphoric mood. The patient is not nervous/anxious.        Objective:   Physical Exam  Constitutional: He is oriented to person, place, and time. He appears well-developed.  HENT:  Head: Normocephalic.  Right Ear: External ear normal.  Left Ear: External ear normal.  Eyes: Conjunctivae and EOM are normal.  Neck: Normal range of motion.  Cardiovascular: Normal rate and normal heart sounds.   Pulmonary/Chest: Breath sounds normal.  Abdominal: Bowel sounds are normal.  Musculoskeletal: Normal range of motion. He exhibits no edema and no tenderness.  Neurological: He is alert and oriented to person, place, and time.  Psychiatric: He has a normal mood and affect. His behavior is normal.          Assessment & Plan:   Coronary artery disease. Stable Polymyalgia rheumatica. Per rheumatology Dyslipidemia. We'll check a lipid profile in 6 months

## 2011-11-30 NOTE — Patient Instructions (Signed)
Limit your sodium (Salt) intake    It is important that you exercise regularly, at least 20 minutes 3 to 4 times per week.  If you develop chest pain or shortness of breath seek  medical attention.  Return in 6 months for follow-up  

## 2011-12-01 ENCOUNTER — Encounter (HOSPITAL_COMMUNITY): Payer: Self-pay | Admitting: Radiology

## 2011-12-01 ENCOUNTER — Emergency Department (HOSPITAL_COMMUNITY)
Admission: EM | Admit: 2011-12-01 | Discharge: 2011-12-02 | Disposition: A | Discharge status: Home / Self Care | Payer: Medicare Other | Attending: Emergency Medicine | Admitting: Emergency Medicine

## 2011-12-01 ENCOUNTER — Emergency Department (HOSPITAL_COMMUNITY): Payer: Medicare Other

## 2011-12-01 DIAGNOSIS — Z8673 Personal history of transient ischemic attack (TIA), and cerebral infarction without residual deficits: Secondary | ICD-10-CM | POA: Diagnosis not present

## 2011-12-01 DIAGNOSIS — I2510 Atherosclerotic heart disease of native coronary artery without angina pectoris: Secondary | ICD-10-CM | POA: Diagnosis not present

## 2011-12-01 DIAGNOSIS — K219 Gastro-esophageal reflux disease without esophagitis: Secondary | ICD-10-CM | POA: Diagnosis not present

## 2011-12-01 DIAGNOSIS — R079 Chest pain, unspecified: Secondary | ICD-10-CM | POA: Diagnosis not present

## 2011-12-01 DIAGNOSIS — K449 Diaphragmatic hernia without obstruction or gangrene: Secondary | ICD-10-CM

## 2011-12-01 DIAGNOSIS — Z7982 Long term (current) use of aspirin: Secondary | ICD-10-CM | POA: Insufficient documentation

## 2011-12-01 DIAGNOSIS — I10 Essential (primary) hypertension: Secondary | ICD-10-CM | POA: Insufficient documentation

## 2011-12-01 DIAGNOSIS — K589 Irritable bowel syndrome without diarrhea: Secondary | ICD-10-CM | POA: Insufficient documentation

## 2011-12-01 DIAGNOSIS — R109 Unspecified abdominal pain: Secondary | ICD-10-CM | POA: Diagnosis not present

## 2011-12-01 DIAGNOSIS — Z79899 Other long term (current) drug therapy: Secondary | ICD-10-CM | POA: Insufficient documentation

## 2011-12-01 LAB — CBC WITH DIFFERENTIAL/PLATELET
Eosinophils Relative: 1 % (ref 0–5)
HCT: 40.5 % (ref 39.0–52.0)
Lymphocytes Relative: 19 % (ref 12–46)
Lymphs Abs: 1.5 10*3/uL (ref 0.7–4.0)
MCV: 88.4 fL (ref 78.0–100.0)
Monocytes Absolute: 0.6 10*3/uL (ref 0.1–1.0)
Monocytes Relative: 8 % (ref 3–12)
RBC: 4.58 MIL/uL (ref 4.22–5.81)
WBC: 7.7 10*3/uL (ref 4.0–10.5)

## 2011-12-01 LAB — HEPATIC FUNCTION PANEL
Alkaline Phosphatase: 96 U/L (ref 39–117)
Indirect Bilirubin: 0.3 mg/dL (ref 0.3–0.9)
Total Bilirubin: 0.4 mg/dL (ref 0.3–1.2)

## 2011-12-01 LAB — BASIC METABOLIC PANEL
CO2: 27 mEq/L (ref 19–32)
Calcium: 9.4 mg/dL (ref 8.4–10.5)
Creatinine, Ser: 1.19 mg/dL (ref 0.50–1.35)
Glucose, Bld: 112 mg/dL — ABNORMAL HIGH (ref 70–99)

## 2011-12-01 LAB — URINALYSIS, ROUTINE W REFLEX MICROSCOPIC
Bilirubin Urine: NEGATIVE
Hgb urine dipstick: NEGATIVE
Ketones, ur: NEGATIVE mg/dL
Nitrite: NEGATIVE
pH: 5.5 (ref 5.0–8.0)

## 2011-12-01 LAB — LIPASE, BLOOD: Lipase: 65 U/L — ABNORMAL HIGH (ref 11–59)

## 2011-12-01 MED ORDER — SODIUM CHLORIDE 0.9 % IV SOLN
1000.0000 mL | INTRAVENOUS | Status: DC
  Administered 2011-12-01: 1000 mL via INTRAVENOUS

## 2011-12-01 NOTE — ED Notes (Signed)
Patient transported to X-ray 

## 2011-12-01 NOTE — ED Notes (Signed)
Pt presents with sudden onset of chest pressure left sternal while sitting at computer at work. Pt reports diaphoresis but denies N/V SHOB. Pt was given nitro X 1 324 mg of ASA. Pt repots relief with nitro. Pt has a 1 stent placed

## 2011-12-01 NOTE — ED Provider Notes (Addendum)
History     CSN: [id number]  Arrival date & time 12/01/11  1656   First MD Initiated Contact with Patient 12/01/11 1706      Chief Complaint  Patient presents with  . Chest Pain    (Consider location/radiation/quality/duration/timing/severity/associated sxs/prior treatment) HPIIvan DONAT Burgess is a 78 y.o. male with a history of prior stent and hiatal hernia as well as GERD presents with sharp onset of abdominal pain. Patient was seated at his computer desk, when he had sharp stabbing pain in the middle left and his abdomen, did not radiate, is 10/10 in intensity, but shortly thereafter followed by some dizziness, some tingling around his mouth and mild diaphoresis.  His symptoms started to get better almost immediately went away via transport. Patient was alarmed at the onset of pain that's why he called the squad. He's never had this pain before. No other alleviating or exacerbating factors no other associated symptoms   Past Medical History  Diagnosis Date  . IBS (irritable bowel syndrome)   . Hemorrhoids   . Diverticulosis   . GERD (gastroesophageal reflux disease)   . Tubular adenoma of colon 07/1998  . Anxiety and depression   . Elevated prostate specific antigen (PSA)   . Vitamin B12 deficiency   . UTI (lower urinary tract infection)   . Arthritis   . Sciatica   . Anemia   . TIA (transient ischemic attack)   . Peptic ulcer disease   . Hyperlipidemia   . CAD (coronary artery disease)     PCI 2006, Stress Perfusion Study 2011  . Hypertension     Past Surgical History  Procedure Date  . Ptca 2006    Stenting of proximal LAD w/ 75 % stenosis reduced to 0%  . Cataract extraction   . Cholecystectomy 2001  . Hernia repair   . Hip fracture surgery 03/2007    Right  . Salivary stone removal 11/2007    Family History  Problem Relation Age of Onset  . Colon cancer Maternal Uncle 50    ish  . Coronary artery disease Father     History  Substance Use Topics  . Smoking  status: Former Smoker    Types: Cigarettes    Quit date: 03/29/1972  . Smokeless tobacco: Never Used  . Alcohol Use: Yes     occasional      Review of Systems  Allergies  Amoxicillin; Doxycycline; Erythromycin; Penicillins; and Sulfamethoxazole w-trimethoprim  Home Medications   Current Outpatient Rx  Name Route Sig Dispense Refill  . ASPIRIN 81 MG PO TABS Oral Take 81 mg by mouth daily.    . ATORVASTATIN CALCIUM 20 MG PO TABS Oral Take 20 mg by mouth daily.    . DESONIDE 0.05 % EX OINT Topical Apply 1 application topically every morning.     Marland Kitchen ESOMEPRAZOLE MAGNESIUM 40 MG PO CPDR Oral Take 1 capsule (40 mg total) by mouth daily. 90 capsule 3  . FOLIC ACID PO Oral Take 1 tablet by mouth daily.    Marland Kitchen METOPROLOL SUCCINATE ER 25 MG PO TB24 Oral Take 1 tablet (25 mg total) by mouth daily. 90 tablet 2  . PRESERVISION/LUTEIN PO Oral Take 1 tablet by mouth 2 (two) times daily.    Marland Kitchen NITROGLYCERIN 0.4 MG SL SUBL Sublingual Place 1 tablet (0.4 mg total) under the tongue every 5 (five) minutes as needed. 25 tablet 11  . PREDNISONE 1 MG PO TABS Oral Take 5 mg by mouth daily.  BP 107/54  Pulse 57  Temp 97.7 F (36.5 C) (Oral)  Resp 20  SpO2 96%  Physical Exam VITAL SIGNS:   Filed Vitals:   12/01/11 2129  BP: 144/74  Pulse: 60  Temp: 98 F (36.7 C)  Resp: 22   CONSTITUTIONAL: Awake, oriented, appears non-toxic HENT: Atraumatic, normocephalic, oral mucosa pink and moist, airway patent. Nares patent without drainage. External ears normal. EYES: Conjunctiva clear, EOMI, PERRLA NECK: Trachea midline, non-tender, supple CARDIOVASCULAR: Slightly bradycardic, Normal rhythm PULMONARY/CHEST: Clear to auscultation, no rhonchi, wheezes, or rales. Symmetrical breath sounds. Non-tender. ABDOMINAL: Non-distended, soft, non-tender - no rebound or guarding.  BS normal. NEUROLOGIC: Non-focal, moving all four extremities, no gross sensory or motor deficits. EXTREMITIES: No clubbing,  cyanosis, or edema SKIN: Warm, Dry, No erythema, No rash  ED Course  Procedures (including critical care time)  Labs Reviewed  BASIC METABOLIC PANEL - Abnormal; Notable for the following:    Potassium 3.4 (*)     Glucose, Bld 112 (*)     GFR calc non Af Amer 53 (*)     GFR calc Af Amer 61 (*)     All other components within normal limits  HEPATIC FUNCTION PANEL - Abnormal; Notable for the following:    Albumin 3.4 (*)     All other components within normal limits  LIPASE, BLOOD - Abnormal; Notable for the following:    Lipase 65 (*)     All other components within normal limits  URINALYSIS, ROUTINE W REFLEX MICROSCOPIC - Abnormal; Notable for the following:    APPearance HAZY (*)     All other components within normal limits  CBC WITH DIFFERENTIAL  TROPONIN I   Dg Abd 2 Views  12/01/2011  *RADIOLOGY REPORT*  Clinical Data: Left abdominal pain  ABDOMEN - 2 VIEW  Comparison: 06/04/2010  Findings: Normal bowel gas pattern.  Negative for bowel obstruction or ileus.  No free air.  Lumbar degenerative changes most notably at L4-5.  Three pins are present in the right femur for prior fracture fixation.  No acute bony abnormality.  IMPRESSION: No acute abnormality.   Original Report Authenticated By: Camelia Phenes, M.D.      No diagnosis found.    MDM  [name] is a 78 y.o. male presenting with acute onset abdominal pain which resolved fairly quickly a sensation of dizziness, as well as some mild sweating, and some peri-oral numbness.  Patient's initial presentation was somewhat concerning for AAA, however the patient was completely pain-free in the emergency department. I performed a bedside ultrasound and got 3 good windows of the aorta proximal mid and distal aorta does not exceed 2 and half centimeters. His abdomen has been soft, nondistended and nontender with normal vital signs.  Presentation not consistent with a AAA that is leaking or ruptured. In fact his presentation is not  consistent with any kind of intrathoracic or intra-abdominal emergency.  Patient also does have a pertinent cardiac history however this is more consistent with either a vagal episode or some episodes of GERD. Patient's wife says he has some issues with anxiety and is under quite a bit of stress recently. He is an older gentleman so I have elected to get to cardiac troponins which were both negative, his EKG showed no ST or T wave abnormalities consistent with ischemia or infarction. In fact, show a normal sinus rhythm with a rate of 56 beats per minute, normal axis, normal intervals.  My pretest probability for any  kind of ACS was extremely low and confirmed by this EKG and 2 sets of troponins. Patient has a cardiologist, Dr. Antoine Poche and can followup with him. Patient is currently taking Nexium for GERD, also has a hiatal hernia exacerbating his GERD-type symptoms, have urged him to use either TUMS or Mylanta as needed for breakthrough GERD symptoms.  I explained the diagnosis and have given explicit precautions to return to the ER including return of persistent abdominal pain, syncope, chest pain or any other new or worsening symptoms. The patient understands and accepts the medical plan as it's been dictated and I have answered their questions. Discharge instructions concerning home care and prescriptions have been given.  The patient is STABLE and is discharged to home in good condition.        Jones Skene, MD 12/02/11 0045  Jones Skene, MD 12/02/11 4403

## 2011-12-09 DIAGNOSIS — H353290 Exudative age-related macular degeneration, unspecified eye, stage unspecified: Secondary | ICD-10-CM | POA: Diagnosis not present

## 2011-12-09 DIAGNOSIS — H353190 Nonexudative age-related macular degeneration, unspecified eye, stage unspecified: Secondary | ICD-10-CM | POA: Diagnosis not present

## 2012-02-16 DIAGNOSIS — R51 Headache: Secondary | ICD-10-CM | POA: Diagnosis not present

## 2012-02-16 DIAGNOSIS — M353 Polymyalgia rheumatica: Secondary | ICD-10-CM | POA: Diagnosis not present

## 2012-02-16 DIAGNOSIS — H532 Diplopia: Secondary | ICD-10-CM | POA: Diagnosis not present

## 2012-02-16 DIAGNOSIS — Z13820 Encounter for screening for osteoporosis: Secondary | ICD-10-CM | POA: Diagnosis not present

## 2012-02-17 DIAGNOSIS — H353190 Nonexudative age-related macular degeneration, unspecified eye, stage unspecified: Secondary | ICD-10-CM | POA: Diagnosis not present

## 2012-02-17 DIAGNOSIS — H353290 Exudative age-related macular degeneration, unspecified eye, stage unspecified: Secondary | ICD-10-CM | POA: Diagnosis not present

## 2012-04-10 ENCOUNTER — Other Ambulatory Visit: Payer: Self-pay

## 2012-04-10 MED ORDER — METOPROLOL SUCCINATE ER 25 MG PO TB24: 25.0000 mg | ORAL_TABLET | Freq: Every day | ORAL | Status: DC

## 2012-04-12 ENCOUNTER — Ambulatory Visit (INDEPENDENT_AMBULATORY_CARE_PROVIDER_SITE_OTHER): Payer: Medicare Other | Admitting: Gastroenterology

## 2012-04-12 ENCOUNTER — Encounter: Payer: Self-pay | Admitting: Gastroenterology

## 2012-04-12 VITALS — BP 140/78 | HR 100 | Ht 70.0 in | Wt 184.8 lb

## 2012-04-12 DIAGNOSIS — R198 Other specified symptoms and signs involving the digestive system and abdomen: Secondary | ICD-10-CM

## 2012-04-12 DIAGNOSIS — Z86010 Personal history of colonic polyps: Secondary | ICD-10-CM

## 2012-04-12 MED ORDER — HYOSCYAMINE SULFATE 0.125 MG SL SUBL: SUBLINGUAL_TABLET | SUBLINGUAL | Status: DC

## 2012-04-12 MED ORDER — PEG-KCL-NACL-NASULF-NA ASC-C 100 G PO SOLR: 1.0000 | Freq: Once | ORAL | Status: DC

## 2012-04-12 NOTE — Patient Instructions (Addendum)
You have been scheduled for a colonoscopy with propofol. Please follow written instructions given to you at your visit today.  Please pick up your prep kit at the pharmacy within the next 1-3 days. If you use inhalers (even only as needed) or a CPAP machine, please bring them with you on the day of your procedure.  Stay on a temporary Lactose free diet.  We have sent the following medications to your pharmacy for you to pick up at your convenience: Levsin.

## 2012-04-12 NOTE — Progress Notes (Signed)
History of Present Illness: This is an 79 year old male with a history of fecal urgency following meals for the past month. He has a history of irritable bowel syndrome but has not had symptoms in several years. His last colonoscopy was in April 2008 showing small adenomatous colon polyp. Denies weight loss, abdominal pain, constipation, change in stool caliber, melena, hematochezia, nausea, vomiting, dysphagia, reflux symptoms, chest pain.  Current Medications, Allergies, Past Medical History, Past Surgical History, Family History and Social History were reviewed in [employer] record.  Physical Exam: General: Well developed , well nourished, no acute distress Head: Normocephalic and atraumatic Eyes:  sclerae anicteric, EOMI Ears: Normal auditory acuity Mouth: No deformity or lesions Lungs: Clear throughout to auscultation Heart: Regular rate and rhythm; no murmurs, rubs or bruits Abdomen: Soft, non tender and non distended. No masses, hepatosplenomegaly or hernias noted. Normal Bowel sounds Rectal: Deferred to colonoscopy Musculoskeletal: Symmetrical with no gross deformities  Pulses:  Normal pulses noted Extremities: No clubbing, cyanosis, edema or deformities noted Neurological: Alert oriented x 4, grossly nonfocal Psychological:  Alert and cooperative. Normal mood and affect  Assessment and Recommendations:  1. Change in bowel habits with urgent bowel movements following meals for the past month. Rule out colorectal neoplasms, colitis and other disorders. Lactose avoidance for 5-7 days and assess response. Hyoscyamine before meals. Schedule colonoscopy. The risks, benefits, and alternatives to colonoscopy with possible biopsy and possible polypectomy were discussed with the patient and they consent to proceed.   2. Personal history of adenomatous colon polyps. Although he is beyond the age for his standard surveillance protocols his overall health is good and he  has a clear indication for diagnostic colonoscopy.

## 2012-04-13 DIAGNOSIS — H353190 Nonexudative age-related macular degeneration, unspecified eye, stage unspecified: Secondary | ICD-10-CM | POA: Diagnosis not present

## 2012-04-13 DIAGNOSIS — H353290 Exudative age-related macular degeneration, unspecified eye, stage unspecified: Secondary | ICD-10-CM | POA: Diagnosis not present

## 2012-04-14 ENCOUNTER — Telehealth: Payer: Self-pay | Admitting: Gastroenterology

## 2012-04-14 NOTE — Telephone Encounter (Signed)
Informed pharmacist that we have not received a fax from them but can give him a free unit of [employer] prep voucher instead. Gave the BIN, GROUP, and ID # to the pharmacist and she states she call back if there is any problems.

## 2012-04-14 NOTE — Telephone Encounter (Signed)
Questions answered regarding the prep and he wondered how often he should take his Levsin since it states before meals. Told him to take it before the biggest meals of the day like breakfast, lunch and dinner. Pt agreed and verbalized understanding.

## 2012-04-23 ENCOUNTER — Telehealth: Payer: Self-pay | Admitting: Internal Medicine

## 2012-04-23 NOTE — Telephone Encounter (Signed)
Ate a piece of toast this AM - ? If ok for colonoscopy tomorrow. Has been on liquids since. Advised to proceed with prep and colonoscopy.

## 2012-04-24 ENCOUNTER — Other Ambulatory Visit: Payer: Self-pay | Admitting: *Deleted

## 2012-04-24 ENCOUNTER — Encounter: Payer: Self-pay | Admitting: Gastroenterology

## 2012-04-24 ENCOUNTER — Ambulatory Visit (AMBULATORY_SURGERY_CENTER): Payer: Medicare Other | Admitting: Gastroenterology

## 2012-04-24 VITALS — BP 137/100 | HR 74 | Temp 97.9°F | Resp 21 | Ht 70.0 in | Wt 184.0 lb

## 2012-04-24 DIAGNOSIS — I10 Essential (primary) hypertension: Secondary | ICD-10-CM | POA: Diagnosis not present

## 2012-04-24 DIAGNOSIS — K589 Irritable bowel syndrome without diarrhea: Secondary | ICD-10-CM | POA: Diagnosis not present

## 2012-04-24 DIAGNOSIS — D126 Benign neoplasm of colon, unspecified: Secondary | ICD-10-CM

## 2012-04-24 DIAGNOSIS — F341 Dysthymic disorder: Secondary | ICD-10-CM | POA: Diagnosis not present

## 2012-04-24 DIAGNOSIS — Z86010 Personal history of colonic polyps: Secondary | ICD-10-CM | POA: Diagnosis not present

## 2012-04-24 DIAGNOSIS — K5730 Diverticulosis of large intestine without perforation or abscess without bleeding: Secondary | ICD-10-CM | POA: Diagnosis not present

## 2012-04-24 DIAGNOSIS — R198 Other specified symptoms and signs involving the digestive system and abdomen: Secondary | ICD-10-CM | POA: Diagnosis not present

## 2012-04-24 DIAGNOSIS — Z8673 Personal history of transient ischemic attack (TIA), and cerebral infarction without residual deficits: Secondary | ICD-10-CM | POA: Diagnosis not present

## 2012-04-24 DIAGNOSIS — K649 Unspecified hemorrhoids: Secondary | ICD-10-CM | POA: Diagnosis not present

## 2012-04-24 DIAGNOSIS — I2510 Atherosclerotic heart disease of native coronary artery without angina pectoris: Secondary | ICD-10-CM | POA: Diagnosis not present

## 2012-04-24 MED ORDER — SODIUM CHLORIDE 0.9 % IV SOLN: 500.0000 mL | INTRAVENOUS | Status: DC

## 2012-04-24 MED ORDER — DICYCLOMINE HCL 10 MG PO CAPS: 10.0000 mg | ORAL_CAPSULE | Freq: Three times a day (TID) | ORAL | Status: DC

## 2012-04-24 NOTE — Progress Notes (Signed)
Called to room to assist during endoscopic procedure.  Patient ID and intended procedure confirmed with present staff. Received instructions for my participation in the procedure from the performing physician.  

## 2012-04-24 NOTE — Progress Notes (Signed)
Pt. States that he is allergic to hycosamine-it makes him itch.  No respiratory symptoms.  Dr. Russella Dar advised.

## 2012-04-24 NOTE — Op Note (Signed)
[facility] [address] [place], [address]   COLONOSCOPY PROCEDURE REPORT  PATIENT: [name], [name]  MR#: [id number] BIRTHDATE: 1923-01-22 , 89  yrs. old GENDER: Male ENDOSCOPIST: Meryl Dare, MD, [facility] PROCEDURE DATE:  04/24/2012 PROCEDURE:   Colonoscopy with snare polypectomy ASA CLASS:   Class II INDICATIONS:Change in bowel habits and patient's personal history of adenomatous colon polyps. MEDICATIONS: MAC sedation, administered by CRNA and propofol (Diprivan) 300mg  IV DESCRIPTION OF PROCEDURE:   After the risks benefits and alternatives of the procedure were thoroughly explained, informed consent was obtained.  A digital rectal exam revealed no abnormalities of the rectum.   The LB CF-[id number]  endoscope was introduced through the anus and advanced to the cecum, which was identified by both the appendix and ileocecal valve. No adverse events experienced.   The quality of the prep was good, using MoviPrep  The instrument was then slowly withdrawn as the colon was fully examined.  COLON FINDINGS: Small angiodysplastic lesion was found at the cecum. Three sessile polyps measuring 5-7 mm in size were found in the ascending colon and at the hepatic flexure.  A polypectomy was performed with a cold snare.  The resection was complete and the polyp tissue was completely retrieved.   Mild diverticulosis was noted in the sigmoid colon.   The colon was otherwise normal. There was no diverticulosis, inflammation, polyps or cancers unless previously stated.  Retroflexed views revealed small internal hemorrhoids. The time to cecum=1 minutes 58 seconds.  Withdrawal time=11 minutes 20 seconds.  The scope was withdrawn and the procedure completed. COMPLICATIONS: There were no complications.  ENDOSCOPIC IMPRESSION: 1.   Small angiodysplastic lesion at the cecum 2.   Three sessile polyps measuring 5-7 mm in the ascending colon and at the hepatic flexure;  polypectomy performed with a cold snare  3.   Mild diverticulosis was noted in the sigmoid colon 4.   Small internal hemorrhoids  RECOMMENDATIONS: 1.  Await pathology results 2.  High fiber diet with liberal fluid intake. 3.  Given your age, you will not need another colonoscopy for colon cancer screening or polyp surveillance.  These types of tests usually stop around the age 22. 4.  Continue treatment for IBS  eSigned:  Meryl Dare, MD, [facility] 04/24/2012 12:02 PM     PATIENT NAME:  Kemauri, Musa MR#: [id number]

## 2012-04-24 NOTE — Telephone Encounter (Signed)
Advised pt. spouse that dicylomine 10mg . Was sent to [facility] on [address].

## 2012-04-24 NOTE — Progress Notes (Signed)
Patient did not experience any of the following events: a burn prior to discharge; a fall within the facility; wrong site/side/patient/procedure/implant event; or a hospital transfer or hospital admission upon discharge from the facility. (G8907) Patient did not have preoperative order for IV antibiotic SSI prophylaxis. (G8918)  

## 2012-04-24 NOTE — Patient Instructions (Addendum)
YOU HAD AN ENDOSCOPIC PROCEDURE TODAY AT THE [facility]: Refer to the procedure report that was given to you for any specific questions about what was found during the examination.  If the procedure report does not answer your questions, please call your gastroenterologist to clarify.  If you requested that your care partner not be given the details of your procedure findings, then the procedure report has been included in a sealed envelope for you to review at your convenience later.  YOU SHOULD EXPECT: Some feelings of bloating in the abdomen. Passage of more gas than usual.  Walking can help get rid of the air that was put into your GI tract during the procedure and reduce the bloating. If you had a lower endoscopy (such as a colonoscopy or flexible sigmoidoscopy) you may notice spotting of blood in your stool or on the toilet paper. If you underwent a bowel prep for your procedure, then you may not have a normal bowel movement for a few days.  DIET: Your first meal following the procedure should be a light meal and then it is ok to progress to your normal diet.  A half-sandwich or bowl of soup is an example of a good first meal.  Heavy or fried foods are harder to digest and may make you feel nauseous or bloated.  Likewise meals heavy in dairy and vegetables can cause extra gas to form and this can also increase the bloating.  Drink plenty of fluids but you should avoid alcoholic beverages for 24 hours.  ACTIVITY: Your care partner should take you home directly after the procedure.  You should plan to take it easy, moving slowly for the rest of the day.  You can resume normal activity the day after the procedure however you should NOT DRIVE or use heavy machinery for 24 hours (because of the sedation medicines used during the test).    SYMPTOMS TO REPORT IMMEDIATELY: A gastroenterologist can be reached at any hour.  During normal business hours, 8:30 AM to 5:00 PM Monday through Friday,  call [phone].  After hours and on weekends, please call the GI answering service at [phone] who will take a message and have the physician on call contact you.   Following lower endoscopy (colonoscopy or flexible sigmoidoscopy):  Excessive amounts of blood in the stool  Significant tenderness or worsening of abdominal pains  Swelling of the abdomen that is new, acute  Fever of 100F or higher    FOLLOW UP: If any biopsies were taken you will be contacted by phone or by letter within the next 1-3 weeks.  Call your gastroenterologist if you have not heard about the biopsies in 3 weeks.  Our staff will call the home number listed on your records the next business day following your procedure to check on you and address any questions or concerns that you may have at that time regarding the information given to you following your procedure. This is a courtesy call and so if there is no answer at the home number and we have not heard from you through the emergency physician on call, we will assume that you have returned to your regular daily activities without incident.  SIGNATURES/CONFIDENTIALITY: You and/or your care partner have signed paperwork which will be entered into your electronic medical record.  These signatures attest to the fact that that the information above on your After Visit Summary has been reviewed and is understood.  Full responsibility of the confidentiality   of this discharge information lies with you and/or your care-partner.   Polyp, diverticulosis, high fiber diet information, with liberal fluid intake encourgaged.   Continue IBS treatment.

## 2012-04-24 NOTE — Progress Notes (Signed)
VSS, Pt A&O, Pleased with MAC, Report to April RN. DRM

## 2012-04-25 ENCOUNTER — Telehealth: Payer: Self-pay | Admitting: *Deleted

## 2012-04-25 NOTE — Telephone Encounter (Signed)
  Follow up Call-  Call back number 04/24/2012  Post procedure Call Back phone  # [phone]  Permission to leave phone message Yes     Patient questions:  Do you have a fever, pain , or abdominal swelling? no Pain Score  0 *  Have you tolerated food without any problems? yes  Have you been able to return to your normal activities? yes  Do you have any questions about your discharge instructions: Diet   no Medications  no Follow up visit  no  Do you have questions or concerns about your Care? no  Actions: * If pain score is 4 or above: No action needed, pain <4.

## 2012-04-26 NOTE — Telephone Encounter (Signed)
New prescription for Bentyl sent to pharmacy. Pt's wife notified.

## 2012-04-26 NOTE — Addendum Note (Signed)
Addended by: Ginette Pitman on: 04/26/2012 05:27 PM   Modules accepted: Orders, Medications

## 2012-04-27 ENCOUNTER — Encounter: Payer: Self-pay | Admitting: Gastroenterology

## 2012-05-04 ENCOUNTER — Telehealth: Payer: Self-pay | Admitting: Gastroenterology

## 2012-05-04 NOTE — Telephone Encounter (Signed)
I have reviewed the results with the patient, all questions were answered.  He will call back for any additional questions or concerns

## 2012-05-18 DIAGNOSIS — H532 Diplopia: Secondary | ICD-10-CM | POA: Diagnosis not present

## 2012-05-18 DIAGNOSIS — M353 Polymyalgia rheumatica: Secondary | ICD-10-CM | POA: Diagnosis not present

## 2012-05-18 DIAGNOSIS — M899 Disorder of bone, unspecified: Secondary | ICD-10-CM | POA: Diagnosis not present

## 2012-05-18 DIAGNOSIS — M949 Disorder of cartilage, unspecified: Secondary | ICD-10-CM | POA: Diagnosis not present

## 2012-05-25 ENCOUNTER — Encounter: Payer: Self-pay | Admitting: Cardiology

## 2012-05-25 ENCOUNTER — Ambulatory Visit (INDEPENDENT_AMBULATORY_CARE_PROVIDER_SITE_OTHER): Payer: Medicare Other | Admitting: Cardiology

## 2012-05-25 VITALS — BP 142/70 | HR 71 | Ht 71.0 in | Wt 181.0 lb

## 2012-05-25 DIAGNOSIS — I2510 Atherosclerotic heart disease of native coronary artery without angina pectoris: Secondary | ICD-10-CM

## 2012-05-25 NOTE — Patient Instructions (Addendum)
Please hold your Lipitor for 6 weeks to see if your muscle aches resolve. Continue all other medications as listed.  Follow up in 1 year with Dr Antoine Poche.  You will receive a letter in the mail 2 months before you are due.  Please call us when you receive this letter to schedule your follow up appointment.

## 2012-05-25 NOTE — Progress Notes (Signed)
HPI The patient presents for one-year followup. Since I last saw him he had no acute cardiovascular complaints. Unfortunately he is more limited by his joint pains. He still gets around and does some of his daily chores and plans to have a garden.  The patient denies any new symptoms such as chest discomfort, neck or arm discomfort. There has been no new shortness of breath, PND or orthopnea. There have been no reported palpitations, presyncope or syncope.  Allergies  Allergen Reactions  . Amoxicillin     REACTION: unspecified  . Doxycycline   . Erythromycin     REACTION: unspecified  . Penicillins     rash  . Sulfamethoxazole W-Trimethoprim     unknown    Current Outpatient Prescriptions  Medication Sig Dispense Refill  . aspirin 81 MG tablet Take 81 mg by mouth daily.      Marland Kitchen atorvastatin (LIPITOR) 20 MG tablet Take 20 mg by mouth daily.      . clobetasol (TEMOVATE) 0.05 % external solution       . desonide (DESOWEN) 0.05 % ointment Apply 1 application topically every morning.       . dicyclomine (BENTYL) 10 MG capsule Take 1 capsule (10 mg total) by mouth 4 (four) times daily -  before meals and at bedtime.  90 capsule  11  . esomeprazole (NEXIUM) 40 MG capsule Take 1 capsule (40 mg total) by mouth daily.  90 capsule  3  . FOLIC ACID PO Take 1 tablet by mouth daily.      . metoprolol succinate (TOPROL-XL) 25 MG 24 hr tablet Take 1 tablet (25 mg total) by mouth daily.  90 tablet  2  . Multiple Vitamins-Minerals (PRESERVISION/LUTEIN PO) Take 1 tablet by mouth 2 (two) times daily.      . nitroGLYCERIN (NITROSTAT) 0.4 MG SL tablet Place 1 tablet (0.4 mg total) under the tongue every 5 (five) minutes as needed.  25 tablet  11  . predniSONE (DELTASONE) 5 MG tablet Take 5 mg by mouth daily.       No current facility-administered medications for this visit.    Past Medical History  Diagnosis Date  . IBS (irritable bowel syndrome)   . Hemorrhoids   . Diverticulosis   . GERD  (gastroesophageal reflux disease)   . Tubular adenoma of colon 07/1998  . Anxiety and depression   . Elevated prostate specific antigen (PSA)   . Vitamin B12 deficiency   . UTI (lower urinary tract infection)   . Arthritis   . Sciatica   . Anemia   . TIA (transient ischemic attack)   . Peptic ulcer disease   . Hyperlipidemia   . CAD (coronary artery disease)     PCI 2006, Stress Perfusion Study 2011  . Hypertension   . Rheumatoid arthritis   . Polymyalgia     Past Surgical History  Procedure Laterality Date  . Ptca  2006    Stenting of proximal LAD w/ 75 % stenosis reduced to 0%  . Cataract extraction    . Cholecystectomy  2001  . Hernia repair    . Hip fracture surgery  03/2007    Right  . Salivary stone removal  11/2007    ROS:  As stated in the HPI and negative for all other systems.  PHYSICAL EXAM BP 142/70  Pulse 71  Ht 5\' 11"  (1.803 m)  Wt 181 lb (82.101 kg)  BMI 25.26 kg/m2 GENERAL:  Well appearing NECK:  No jugular  venous distention, waveform within normal limits, carotid upstroke brisk and symmetric, no bruits, no thyromegaly LUNGS:  Clear to auscultation bilaterally BACK:  No CVA tenderness CHEST:  Unremarkable HEART:  PMI not displaced or sustained,S1 and S2 within normal limits, no S3, no S4, no clicks, no rubs, no murmurs ABD:  Flat, positive bowel sounds normal in frequency in pitch, no bruits, no rebound, no guarding, no midline pulsatile mass, no hepatomegaly, no splenomegaly EXT:  2 plus pulses throughout, no edema, no cyanosis no clubbing  EKG:  Sinus rhythm, rate 71, LVH by voltage criteria, no acute ST T wave changes.  05/25/2012   ASSESSMENT AND PLAN  CAD:  The patient has no new sypmtoms.  No further cardiovascular testing is indicated.  We will continue with aggressive risk reduction and meds as listed.  HYPERLIPIDEMIA:  I am going to have him hold his Lipitor to see if this helps with any of his joint pains.

## 2012-05-27 ENCOUNTER — Ambulatory Visit (INDEPENDENT_AMBULATORY_CARE_PROVIDER_SITE_OTHER): Payer: Medicare Other | Admitting: Internal Medicine

## 2012-05-27 VITALS — BP 148/81 | HR 65 | Temp 97.2°F | Resp 20

## 2012-05-27 DIAGNOSIS — R55 Syncope and collapse: Secondary | ICD-10-CM | POA: Diagnosis not present

## 2012-05-27 DIAGNOSIS — R070 Pain in throat: Secondary | ICD-10-CM

## 2012-05-27 DIAGNOSIS — I2510 Atherosclerotic heart disease of native coronary artery without angina pectoris: Secondary | ICD-10-CM

## 2012-05-27 DIAGNOSIS — R61 Generalized hyperhidrosis: Secondary | ICD-10-CM | POA: Diagnosis not present

## 2012-05-27 DIAGNOSIS — M353 Polymyalgia rheumatica: Secondary | ICD-10-CM

## 2012-05-27 DIAGNOSIS — R42 Dizziness and giddiness: Secondary | ICD-10-CM | POA: Diagnosis not present

## 2012-05-27 DIAGNOSIS — IMO0001 Reserved for inherently not codable concepts without codable children: Secondary | ICD-10-CM

## 2012-05-27 DIAGNOSIS — M069 Rheumatoid arthritis, unspecified: Secondary | ICD-10-CM

## 2012-05-27 LAB — POCT CBC
Hemoglobin: 14 g/dL — AB (ref 14.1–18.1)
MCH, POC: 29.9 pg (ref 27–31.2)
MCV: 93 fL (ref 80–97)
MPV: 8.8 fL (ref 0–99.8)
RBC: 4.68 M/uL — AB (ref 4.69–6.13)
WBC: 6.3 10*3/uL (ref 4.6–10.2)

## 2012-05-27 MED ORDER — CYANOCOBALAMIN 1000 MCG/ML IJ SOLN: 1000.0000 ug | Freq: Once | INTRAMUSCULAR | Status: DC

## 2012-05-27 MED ORDER — MECLIZINE HCL 12.5 MG PO TABS: 12.5000 mg | ORAL_TABLET | Freq: Three times a day (TID) | ORAL | Status: DC | PRN

## 2012-05-27 NOTE — Progress Notes (Signed)
  Subjective:    Patient ID: [name], male    DO: 13-May-1922, 79 y.o.   MRM: [id number]  HPI Brought to emergency bay dizzy, vertigo, weak. Had normal cardiac f/up 2 days ago with Dr. Antoine Poche. Has PMR, RA, CAD with stent, fatigue. Has gotten since prednisone dose reduced. Has not seen bloody or black stools and BMs have been normal. Complex , long problem list included possible TIA. Had MRI brain 2009 and 2008 for same sxs he has today and normal no stoke. Has seen Dr. Sandria Manly in years past for dizzyness. Need to ro GI bleed from prednisone     Review of Systems     Objective:   Physical Exam  Vitals reviewed. Constitutional: He is oriented to person, place, and time. He appears well-developed and well-nourished.  HENT:  Right Ear: External ear normal.  Left Ear: External ear normal.  Mouth/Throat: Oropharynx is clear and moist and mucous membranes are normal.  Red slick tongue  Eyes: Conjunctivae, EOM and lids are normal. Pupils are equal, round, and reactive to light. No scleral icterus. Right eye exhibits no nystagmus. Left eye exhibits no nystagmus.  Cardiovascular: Normal rate, regular rhythm and normal heart sounds.   Pulmonary/Chest: Effort normal and breath sounds normal.  Abdominal: Soft. Bowel sounds are normal. He exhibits no mass. There is tenderness.  Genitourinary: Prostate normal.  Musculoskeletal: He exhibits tenderness.  Neurological: He is alert and oriented to person, place, and time. He exhibits normal muscle tone. Coordination abnormal.  Psychiatric: His speech is normal and behavior is normal. Judgment and thought content normal. His mood appears anxious. Cognition and memory are normal.  Lots of anxiety and clearing of throat  Trial GI cocktail\  Beefy red tongue  Like B12 def.  Results for orders placed in visit on 05/27/12  POCT CBC      Result Value Range   WBC 6.3  4.6 - 10.2 K/uL   Lymph, poc 1.3  0.6 - 3.4   POC LYMPH PERCENT 21.0  10 - 50 %L    MID (cbc) 0.4  0 - 0.9   POC MID % 7.0  0 - 12 %M   POC Granulocyte 4.5  2 - 6.9   Granulocyte percent 72.0  37 - 80 %G   RBC 4.68 (*) 4.69 - 6.13 M/uL   Hemoglobin 14.0 (*) 14.1 - 18.1 g/dL   HCT, POC 13.0  86.5 - 53.7 %   MCV 93.0  80 - 97 fL   MCH, POC 29.9  27 - 31.2 pg   MCHC 32.2  31.8 - 35.4 g/dL   RDW, POC 78.4     Platelet Count, POC 149  142 - 424 K/uL   MPV 8.8  0 - 99.8 fL  GLUCOSE, POCT (MANUAL RESULT ENTRY)      Result Value Range   POC Glucose 127 (*) 70 - 99 mg/dl  IFOBT (OCCULT BLOOD)      Result Value Range   IFOBT Positive     EKG and BP are normal     Assessment & Plan:  B deficiency untreated recently dx consider Pernicious anemia Dizzy/ Vertigo recurrent/See Dr. Kirtland Bouchard, or [place], or [place] Reflux worse on high dose prednisone PMR/RA chronic FOBT positive/Nexium BID and see Dr. Kirtland Bouchard. Next week

## 2012-05-27 NOTE — Patient Instructions (Signed)
Vertigo Vertigo means you feel like you or your surroundings are moving when they are not. Vertigo can be dangerous if it occurs when you are at work, driving, or performing difficult activities.  CAUSES  Vertigo occurs when there is a conflict of signals sent to your brain from the visual and sensory systems in your body. There are many different causes of vertigo, including:  Infections, especially in the inner ear.  A bad reaction to a drug or misuse of alcohol and medicines.  Withdrawal from drugs or alcohol.  Rapidly changing positions, such as lying down or rolling over in bed.  A migraine headache.  Decreased blood flow to the brain.  Increased pressure in the brain from a head injury, infection, tumor, or bleeding. SYMPTOMS  You may feel as though the world is spinning around or you are falling to the ground. Because your balance is upset, vertigo can cause nausea and vomiting. You may have involuntary eye movements (nystagmus). DIAGNOSIS  Vertigo is usually diagnosed by physical exam. If the cause of your vertigo is unknown, your caregiver may perform imaging tests, such as an MRI scan (magnetic resonance imaging). TREATMENT  Most cases of vertigo resolve on their own, without treatment. Depending on the cause, your caregiver may prescribe certain medicines. If your vertigo is related to body position issues, your caregiver may recommend movements or procedures to correct the problem. In rare cases, if your vertigo is caused by certain inner ear problems, you may need surgery. HOME CARE INSTRUCTIONS   Follow your caregiver's instructions.  Avoid driving.  Avoid operating heavy machinery.  Avoid performing any tasks that would be dangerous to you or others during a vertigo episode.  Tell your caregiver if you notice that certain medicines seem to be causing your vertigo. Some of the medicines used to treat vertigo episodes can actually make them worse in some people. SEEK  IMMEDIATE MEDICAL CARE IF:   Your medicines do not relieve your vertigo or are making it worse.  You develop problems with talking, walking, weakness, or using your arms, hands, or legs.  You develop severe headaches.  Your nausea or vomiting continues or gets worse.  You develop visual changes.  A family member notices behavioral changes.  Your condition gets worse. MAKE SURE YOU:  Understand these instructions.  Will watch your condition.  Will get help right away if you are not doing well or get worse. Document Released: 12/23/2004 Document Revised: 06/07/2011 Document Reviewed: 10/01/2010 [employer] Patient Information 2013 [employer].  

## 2012-05-30 ENCOUNTER — Ambulatory Visit (INDEPENDENT_AMBULATORY_CARE_PROVIDER_SITE_OTHER): Payer: Medicare Other | Admitting: Internal Medicine

## 2012-05-30 ENCOUNTER — Encounter: Payer: Self-pay | Admitting: Internal Medicine

## 2012-05-30 VITALS — BP 140/76 | HR 77 | Temp 97.9°F | Resp 20 | Wt 184.0 lb

## 2012-05-30 DIAGNOSIS — K279 Peptic ulcer, site unspecified, unspecified as acute or chronic, without hemorrhage or perforation: Secondary | ICD-10-CM

## 2012-05-30 DIAGNOSIS — F341 Dysthymic disorder: Secondary | ICD-10-CM

## 2012-05-30 DIAGNOSIS — M353 Polymyalgia rheumatica: Secondary | ICD-10-CM

## 2012-05-30 DIAGNOSIS — H8110 Benign paroxysmal vertigo, unspecified ear: Secondary | ICD-10-CM

## 2012-05-30 NOTE — Progress Notes (Signed)
Subjective:    Patient ID: [name], male    DOB: 02/01/1923, 79 y.o.   MRN: [id number]  HPI  79 year old patient who is seen today in followup. He presented to the urgent care 3 days ago after experiencing severe vertigo that morning. Symptoms have not reoccurred. Do to a history of B12 deficiency , the patient received a B12  Injection;  1 mg was given IM. Due  to a history of B12 deficiency, one year ago a methyl melanic acid level was obtained which was normal without treatment for some time.  CBC revealed no anemia or elevated MCV. Due to history of PMR sed rate was obtained and was normal. He remains on low dose prednisone therapy. He has a history of anxiety depression and his wife  asked that the patient be considered for anxiolytic therapy. The risks and benefits of therapy discussed and they will consider for a period of time.  Past Medical History  Diagnosis Date  . IBS (irritable bowel syndrome)   . Hemorrhoids   . Diverticulosis   . GERD (gastroesophageal reflux disease)   . Tubular adenoma of colon 07/1998  . Anxiety and depression   . Elevated prostate specific antigen (PSA)   . Vitamin B12 deficiency   . UTI (lower urinary tract infection)   . Arthritis   . Sciatica   . Anemia   . TIA (transient ischemic attack)   . Peptic ulcer disease   . Hyperlipidemia   . CAD (coronary artery disease)     PCI 2006, Stress Perfusion Study 2011  . Hypertension   . Rheumatoid arthritis   . Polymyalgia     History   Social History  . Marital Status: Married    Spouse Name: N/A    Number of Children: N/A  . Years of Education: N/A   Occupational History  . Retired    Social History Main Topics  . Smoking status: Former Smoker    Types: Cigarettes    Quit date: 03/29/1972  . Smokeless tobacco: Never Used  . Alcohol Use: No     Comment: occasional  . Drug Use: No  . Sexually Active: Not Currently   Other Topics Concern  . Not on file   Social History Narrative   . No narrative on file    Past Surgical History  Procedure Laterality Date  . Ptca  2006    Stenting of proximal LAD w/ 75 % stenosis reduced to 0%  . Cataract extraction    . Cholecystectomy  2001  . Hernia repair    . Hip fracture surgery  03/2007    Right  . Salivary stone removal  11/2007    Family History  Problem Relation Age of Onset  . Colon cancer Maternal Uncle 50    ish  . Coronary artery disease Father     Allergies  Allergen Reactions  . Amoxicillin     REACTION: unspecified  . Doxycycline   . Erythromycin     REACTION: unspecified  . Penicillins     rash  . Sulfamethoxazole W-Trimethoprim     unknown    Current Outpatient Prescriptions on File Prior to Visit  Medication Sig Dispense Refill  . aspirin 81 MG tablet Take 81 mg by mouth daily.      Marland Kitchen atorvastatin (LIPITOR) 20 MG tablet Take 20 mg by mouth daily.      . clobetasol (TEMOVATE) 0.05 % external solution       . desonide (  DESOWEN) 0.05 % ointment Apply 1 application topically every morning.       . dicyclomine (BENTYL) 10 MG capsule Take 1 capsule (10 mg total) by mouth 4 (four) times daily -  before meals and at bedtime.  90 capsule  11  . FOLIC ACID PO Take 1 tablet by mouth daily.      . meclizine (ANTIVERT) 12.5 MG tablet Take 1 tablet (12.5 mg total) by mouth 3 (three) times daily as needed.  30 tablet  0  . metoprolol succinate (TOPROL-XL) 25 MG 24 hr tablet Take 1 tablet (25 mg total) by mouth daily.  90 tablet  2  . Multiple Vitamins-Minerals (PRESERVISION/LUTEIN PO) Take 1 tablet by mouth 2 (two) times daily.      . nitroGLYCERIN (NITROSTAT) 0.4 MG SL tablet Place 1 tablet (0.4 mg total) under the tongue every 5 (five) minutes as needed.  25 tablet  11  . predniSONE (DELTASONE) 5 MG tablet Take 5 mg by mouth daily.      Marland Kitchen esomeprazole (NEXIUM) 40 MG capsule Take 1 capsule (40 mg total) by mouth daily.  90 capsule  3   Current Facility-Administered Medications on File Prior to Visit   Medication Dose Route Frequency Provider Last Rate Last Dose  . cyanocobalamin ((VITAMIN B-12)) injection 1,000 mcg  1,000 mcg Intramuscular Once Jonita Albee, MD        BP 140/76  Pulse 77  Temp(Src) 97.9 F (36.6 C) (Oral)  Resp 20  Wt 184 lb (83.462 kg)  BMI 25.67 kg/m2  SpO2 95%       Review of Systems  Constitutional: Negative for fever, chills, appetite change and fatigue.  HENT: Negative for hearing loss, ear pain, congestion, sore throat, trouble swallowing, neck stiffness, dental problem, voice change and tinnitus.   Eyes: Negative for pain, discharge and visual disturbance.  Respiratory: Negative for cough, chest tightness, wheezing and stridor.   Cardiovascular: Negative for chest pain, palpitations and leg swelling.  Gastrointestinal: Negative for nausea, vomiting, abdominal pain, diarrhea, constipation, blood in stool and abdominal distention.  Genitourinary: Negative for urgency, hematuria, flank pain, discharge, difficulty urinating and genital sores.  Musculoskeletal: Negative for myalgias, back pain, joint swelling, arthralgias and gait problem.  Skin: Negative for rash.  Neurological: Positive for weakness and light-headedness. Negative for dizziness, syncope, speech difficulty, numbness and headaches.  Hematological: Negative for adenopathy. Does not bruise/bleed easily.  Psychiatric/Behavioral: Negative for behavioral problems and dysphoric mood. The patient is nervous/anxious.        Objective:   Physical Exam  Constitutional: He is oriented to person, place, and time. He appears well-developed.  HENT:  Head: Normocephalic.  Right Ear: External ear normal.  Left Ear: External ear normal.  Eyes: Conjunctivae and EOM are normal.  Neck: Normal range of motion.  Cardiovascular: Normal rate and normal heart sounds.   Pulmonary/Chest: Breath sounds normal.  Abdominal: Bowel sounds are normal.  Musculoskeletal: Normal range of motion. He exhibits no  edema and no tenderness.  Neurological: He is alert and oriented to person, place, and time.  Psychiatric: He has a normal mood and affect. His behavior is normal.          Assessment & Plan:   Polymyalgia rheumatica. Sedimentation rate recently normal we'll continue of 5 mg of prednisone daily. The patient has a physical scheduled in one month and we'll consider dose reduction at that time Possible B12 deficiency. This is a historical diagnosis. A methylmalonic acid level without therapy was  normal one year ago. No evidence of macrocytic anemia. Oral B12 supplementation recommended. History of peptic ulcer disease/GERD. The patient remains on PPI therapy and apparently benefits;  if patient truly has B12 deficiency (and associated atrophic gastritis) unclear of benefit to PPI therapy  Reassess 4-5 weeks of the time of his annual exam

## 2012-05-30 NOTE — Patient Instructions (Signed)
Return in one month for your annual physical  Call or return to clinic prn if these symptoms worsen or fail to improve as anticipated.

## 2012-06-01 ENCOUNTER — Encounter: Payer: Medicare Other | Admitting: Internal Medicine

## 2012-06-01 DIAGNOSIS — H353290 Exudative age-related macular degeneration, unspecified eye, stage unspecified: Secondary | ICD-10-CM | POA: Diagnosis not present

## 2012-06-08 DIAGNOSIS — L259 Unspecified contact dermatitis, unspecified cause: Secondary | ICD-10-CM | POA: Diagnosis not present

## 2012-06-12 ENCOUNTER — Other Ambulatory Visit: Payer: Self-pay | Admitting: Otolaryngology

## 2012-06-12 DIAGNOSIS — R1310 Dysphagia, unspecified: Secondary | ICD-10-CM | POA: Diagnosis not present

## 2012-06-12 DIAGNOSIS — K219 Gastro-esophageal reflux disease without esophagitis: Secondary | ICD-10-CM | POA: Diagnosis not present

## 2012-06-12 DIAGNOSIS — R05 Cough: Secondary | ICD-10-CM | POA: Diagnosis not present

## 2012-06-14 DIAGNOSIS — L408 Other psoriasis: Secondary | ICD-10-CM | POA: Diagnosis not present

## 2012-06-15 ENCOUNTER — Ambulatory Visit
Admission: RE | Admit: 2012-06-15 | Discharge: 2012-06-15 | Disposition: A | Discharge status: Home / Self Care | Payer: Medicare Other | Source: Ambulatory Visit | Attending: Otolaryngology | Admitting: Otolaryngology

## 2012-06-15 DIAGNOSIS — K449 Diaphragmatic hernia without obstruction or gangrene: Secondary | ICD-10-CM | POA: Diagnosis not present

## 2012-06-15 DIAGNOSIS — K219 Gastro-esophageal reflux disease without esophagitis: Secondary | ICD-10-CM

## 2012-06-23 ENCOUNTER — Encounter: Payer: Self-pay | Admitting: Internal Medicine

## 2012-06-23 ENCOUNTER — Ambulatory Visit (INDEPENDENT_AMBULATORY_CARE_PROVIDER_SITE_OTHER): Payer: Medicare Other | Admitting: Internal Medicine

## 2012-06-23 VITALS — BP 136/80 | HR 65 | Temp 97.5°F | Resp 20 | Ht 69.25 in | Wt 178.0 lb

## 2012-06-23 DIAGNOSIS — E559 Vitamin D deficiency, unspecified: Secondary | ICD-10-CM | POA: Diagnosis not present

## 2012-06-23 DIAGNOSIS — E039 Hypothyroidism, unspecified: Secondary | ICD-10-CM | POA: Diagnosis not present

## 2012-06-23 DIAGNOSIS — M353 Polymyalgia rheumatica: Secondary | ICD-10-CM

## 2012-06-23 DIAGNOSIS — Z0000 Encounter for general adult medical examination without abnormal findings: Secondary | ICD-10-CM | POA: Diagnosis not present

## 2012-06-23 DIAGNOSIS — I2510 Atherosclerotic heart disease of native coronary artery without angina pectoris: Secondary | ICD-10-CM

## 2012-06-23 DIAGNOSIS — R5381 Other malaise: Secondary | ICD-10-CM

## 2012-06-23 DIAGNOSIS — E538 Deficiency of other specified B group vitamins: Secondary | ICD-10-CM | POA: Diagnosis not present

## 2012-06-23 DIAGNOSIS — R5383 Other fatigue: Secondary | ICD-10-CM | POA: Diagnosis not present

## 2012-06-23 DIAGNOSIS — R05 Cough: Secondary | ICD-10-CM

## 2012-06-23 DIAGNOSIS — E785 Hyperlipidemia, unspecified: Secondary | ICD-10-CM

## 2012-06-23 DIAGNOSIS — K219 Gastro-esophageal reflux disease without esophagitis: Secondary | ICD-10-CM

## 2012-06-23 DIAGNOSIS — R059 Cough, unspecified: Secondary | ICD-10-CM

## 2012-06-23 MED ORDER — FLUTICASONE PROPIONATE 50 MCG/ACT NA SUSP: 2.0000 | Freq: Every day | NASAL | Status: DC

## 2012-06-23 NOTE — Progress Notes (Signed)
Patient ID: [name], male   DOB: 1922-07-30, 79 y.o.   MRN: [id number]  Subjective:    Patient ID: [name], male    DOB: September 28, 1922, 79 y.o.   MRN: [id number]  HPI   79 year-old patient who is in today for a preventive health examination. He is followed by cardiology for  coronary artery disease and also by rheumatology due to the polymyalgia rheumatica. Presently he is also being followed by dermatology (Dr. Jorja Loa) do to pruritus. He is also been followed by rheumatology he has recently increased prednisone from 1 mg daily to 5 mg daily. This occurred 2 weeks ago. The patient feels unimproved. Lipitor has been discontinued by cardiology. He's had considerable coughing especially following meals and did have a recent normal swallowing evaluation. He does describe postnasal drip and is on a anti-reflex regimen. He a colonoscopy in January of this year. A biopsy for temporal arteritis was neg 11 months ago.  Past Medical History  Diagnosis Date  . IBS (irritable bowel syndrome)   . Hemorrhoids   . Diverticulosis   . GERD (gastroesophageal reflux disease)   . Tubular adenoma of colon 07/1998  . Anxiety and depression   . Elevated prostate specific antigen (PSA)   . Vitamin B12 deficiency   . UTI (lower urinary tract infection)   . Arthritis   . Sciatica   . Anemia   . TIA (transient ischemic attack)   . Peptic ulcer disease   . Hyperlipidemia   . CAD (coronary artery disease)     PCI 2006, Stress Perfusion Study 2011  . Hypertension   . Rheumatoid arthritis   . Polymyalgia     History   Social History  . Marital Status: Married    Spouse Name: N/A    Number of Children: N/A  . Years of Education: N/A   Occupational History  . Retired    Social History Main Topics  . Smoking status: Former Smoker    Types: Cigarettes    Quit date: 03/29/1972  . Smokeless tobacco: Never Used  . Alcohol Use: No     Comment: occasional  . Drug Use: No  . Sexually Active: Not  Currently   Other Topics Concern  . Not on file   Social History Narrative  . No narrative on file    Past Surgical History  Procedure Laterality Date  . Ptca  2006    Stenting of proximal LAD w/ 75 % stenosis reduced to 0%  . Cataract extraction    . Cholecystectomy  2001  . Hernia repair    . Hip fracture surgery  03/2007    Right  . Salivary stone removal  11/2007    Family History  Problem Relation Age of Onset  . Colon cancer Maternal Uncle 50    ish  . Coronary artery disease Father     Allergies  Allergen Reactions  . Amoxicillin     REACTION: unspecified  . Doxycycline   . Erythromycin     REACTION: unspecified  . Penicillins     rash  . Sulfamethoxazole W-Trimethoprim     unknown    Current Outpatient Prescriptions on File Prior to Visit  Medication Sig Dispense Refill  . aspirin 81 MG tablet Take 81 mg by mouth daily.      Marland Kitchen esomeprazole (NEXIUM) 40 MG capsule Take 1 capsule (40 mg total) by mouth daily.  90 capsule  3  . FOLIC ACID PO Take 1 tablet  by mouth daily.      . metoprolol succinate (TOPROL-XL) 25 MG 24 hr tablet Take 1 tablet (25 mg total) by mouth daily.  90 tablet  2  . Multiple Vitamins-Minerals (PRESERVISION/LUTEIN PO) Take 1 tablet by mouth 2 (two) times daily.      . nitroGLYCERIN (NITROSTAT) 0.4 MG SL tablet Place 1 tablet (0.4 mg total) under the tongue every 5 (five) minutes as needed.  25 tablet  11  . predniSONE (DELTASONE) 5 MG tablet Take 5 mg by mouth daily.       Current Facility-Administered Medications on File Prior to Visit  Medication Dose Route Frequency Provider Last Rate Last Dose  . cyanocobalamin ((VITAMIN B-12)) injection 1,000 mcg  1,000 mcg Intramuscular Once Jonita Albee, MD        BP 136/80  Pulse 65  Temp(Src) 97.5 F (36.4 C) (Oral)  Resp 20  Ht 5' 9.25" (1.759 m)  Wt 178 lb (80.74 kg)  BMI 26.09 kg/m2  SpO2 98%      1. Risk factors, based on past  M,S,F history-  patient has known coronary artery  disease. Cardiac risk factors include dyslipidemia and hypertension  2.  Physical activities: Remains fairly active considering his age  30.  Depression/mood: No history of depression or mood disorder  4.  Hearing: Minor deficits only  5.  ADL's: Remains independent in all aspects of daily living  6.  Fall risk: Moderate due to age and visual disturbance  7.  Home safety: No problems identified 8.  Height weight, and visual acuity; height and weight stable. Followed closely by ophthalmology due to macular degeneration 9.  Counseling: Low-salt heart healthy diet and regular exercise encouraged  10. Lab orders based on risk factors: Temperature profile will be reviewed 11. Referral : Followup ophthalmology and cardiology as well as rheumatology  12. Care plan: Heart healthy diet regular exercise  13. Cognitive assessment: Alert and appropriate does have some mild age-related very mild cognitive impairment    Past Medical History  Diagnosis Date  . IBS (irritable bowel syndrome)   . Hemorrhoids   . Diverticulosis   . GERD (gastroesophageal reflux disease)   . Tubular adenoma of colon 07/1998  . Anxiety and depression   . Elevated prostate specific antigen (PSA)   . Vitamin B12 deficiency   . UTI (lower urinary tract infection)   . Arthritis   . Sciatica   . Anemia   . TIA (transient ischemic attack)   . Peptic ulcer disease   . Hyperlipidemia   . CAD (coronary artery disease)     PCI 2006, Stress Perfusion Study 2011  . Hypertension   . Rheumatoid arthritis   . Polymyalgia     History   Social History  . Marital Status: Married    Spouse Name: N/A    Number of Children: N/A  . Years of Education: N/A   Occupational History  . Retired    Social History Main Topics  . Smoking status: Former Smoker    Types: Cigarettes    Quit date: 03/29/1972  . Smokeless tobacco: Never Used  . Alcohol Use: No     Comment: occasional  . Drug Use: No  . Sexually Active: Not  Currently   Other Topics Concern  . Not on file   Social History Narrative  . No narrative on file    Past Surgical History  Procedure Laterality Date  . Ptca  2006    Stenting of proximal  LAD w/ 75 % stenosis reduced to 0%  . Cataract extraction    . Cholecystectomy  2001  . Hernia repair    . Hip fracture surgery  03/2007    Right  . Salivary stone removal  11/2007    Family History  Problem Relation Age of Onset  . Colon cancer Maternal Uncle 50    ish  . Coronary artery disease Father     Allergies  Allergen Reactions  . Amoxicillin     REACTION: unspecified  . Doxycycline   . Erythromycin     REACTION: unspecified  . Penicillins     rash  . Sulfamethoxazole W-Trimethoprim     unknown    Current Outpatient Prescriptions on File Prior to Visit  Medication Sig Dispense Refill  . aspirin 81 MG tablet Take 81 mg by mouth daily.      Marland Kitchen esomeprazole (NEXIUM) 40 MG capsule Take 1 capsule (40 mg total) by mouth daily.  90 capsule  3  . FOLIC ACID PO Take 1 tablet by mouth daily.      . metoprolol succinate (TOPROL-XL) 25 MG 24 hr tablet Take 1 tablet (25 mg total) by mouth daily.  90 tablet  2  . Multiple Vitamins-Minerals (PRESERVISION/LUTEIN PO) Take 1 tablet by mouth 2 (two) times daily.      . nitroGLYCERIN (NITROSTAT) 0.4 MG SL tablet Place 1 tablet (0.4 mg total) under the tongue every 5 (five) minutes as needed.  25 tablet  11  . predniSONE (DELTASONE) 5 MG tablet Take 5 mg by mouth daily.       Current Facility-Administered Medications on File Prior to Visit  Medication Dose Route Frequency Provider Last Rate Last Dose  . cyanocobalamin ((VITAMIN B-12)) injection 1,000 mcg  1,000 mcg Intramuscular Once Jonita Albee, MD        BP 136/80  Pulse 65  Temp(Src) 97.5 F (36.4 C) (Oral)  Resp 20  Ht 5' 9.25" (1.759 m)  Wt 178 lb (80.74 kg)  BMI 26.09 kg/m2  SpO2 98%     Review of Systems  Constitutional: Positive for fatigue. Negative for fever,  chills, activity change and appetite change.  HENT: Positive for ear pain. Negative for hearing loss, congestion, rhinorrhea, sneezing, mouth sores, trouble swallowing, neck pain, neck stiffness, dental problem, voice change, sinus pressure and tinnitus.   Eyes: Positive for visual disturbance. Negative for photophobia, pain and redness.  Respiratory: Positive for cough. Negative for apnea, choking, chest tightness, shortness of breath and wheezing.   Cardiovascular: Negative for chest pain, palpitations and leg swelling.  Gastrointestinal: Negative for nausea, vomiting, abdominal pain, diarrhea, constipation, blood in stool, abdominal distention, anal bleeding and rectal pain.  Genitourinary: Negative for dysuria, urgency, frequency, hematuria, flank pain, decreased urine volume, discharge, penile swelling, scrotal swelling, difficulty urinating, genital sores and testicular pain.  Musculoskeletal: Positive for myalgias. Negative for back pain, joint swelling, arthralgias and gait problem.  Skin: Negative for color change, rash and wound.  Neurological: Positive for weakness. Negative for dizziness, tremors, seizures, syncope, facial asymmetry, speech difficulty, light-headedness, numbness and headaches.  Hematological: Negative for adenopathy. Does not bruise/bleed easily.  Psychiatric/Behavioral: Negative for suicidal ideas, hallucinations, behavioral problems, confusion, sleep disturbance, self-injury, dysphoric mood, decreased concentration and agitation. The patient is not nervous/anxious.        Objective:   Physical Exam  Constitutional: He appears well-developed and well-nourished.  Blood pressure 130/82 bilaterally  HENT:  Head: Normocephalic and atraumatic.  Right Ear: External ear  normal.  Left Ear: External ear normal.  Nose: Nose normal.  Mouth/Throat: Oropharynx is clear and moist.  Edentulous  Eyes: Conjunctivae and EOM are normal. Pupils are equal, round, and reactive to  light. No scleral icterus.  Neck: Normal range of motion. Neck supple. No JVD present. No thyromegaly present.  Cardiovascular: Regular rhythm, normal heart sounds and intact distal pulses.  Exam reveals no gallop and no friction rub.   No murmur heard. Pedal pulses full  Pulmonary/Chest: Effort normal and breath sounds normal. He exhibits no tenderness.  Abdominal: Soft. Bowel sounds are normal. He exhibits no distension and no mass. There is no tenderness.  Genitourinary: Rectum normal, prostate normal and penis normal. No penile tenderness.  Musculoskeletal: Normal range of motion. He exhibits no edema and no tenderness.  Lymphadenopathy:    He has no cervical adenopathy.  Neurological: He is alert. He has normal reflexes. No cranial nerve deficit. Coordination normal.  Mild decreased vibratory sensation distally Reflexes brisk  Skin: Skin is warm and dry. No rash noted.  Psychiatric: He has a normal mood and affect. His behavior is normal.          Assessment & Plan:   Preventive health examination  Polymyalgia rheumatica. Continue slow prednisone taper per rheumatology Coronary artery disease stable Dyslipidemia.  atorvastatin has been discontinued Cough. This seems to be his most predominant symptom and is worse following meals. However he does describe chronic postnasal drip. Will treat with a anti-histamine and nasal steroid short term to see if this is a benefit.  Recheck 6 months Laboratory update will be reviewed

## 2012-06-23 NOTE — Patient Instructions (Signed)
Consider reducing prednisone to 2.5 mg daily (one half of 5 mg)- confirm with rheumatology  Use a nonsedating antihistamine such as Allegra or Claritin once daily Nasal spray as directed  Avoids foods high in acid such as tomatoes citrus juices, and spicy foods.  Avoid eating within two hours of lying down or before exercising.  Do not overheat.  Try smaller more frequent meals.

## 2012-07-13 DIAGNOSIS — Z961 Presence of intraocular lens: Secondary | ICD-10-CM | POA: Diagnosis not present

## 2012-07-13 DIAGNOSIS — H353290 Exudative age-related macular degeneration, unspecified eye, stage unspecified: Secondary | ICD-10-CM | POA: Diagnosis not present

## 2012-07-13 DIAGNOSIS — H353190 Nonexudative age-related macular degeneration, unspecified eye, stage unspecified: Secondary | ICD-10-CM | POA: Diagnosis not present

## 2012-07-13 DIAGNOSIS — H43819 Vitreous degeneration, unspecified eye: Secondary | ICD-10-CM | POA: Diagnosis not present

## 2012-07-31 DIAGNOSIS — J383 Other diseases of vocal cords: Secondary | ICD-10-CM | POA: Diagnosis not present

## 2012-07-31 DIAGNOSIS — H9209 Otalgia, unspecified ear: Secondary | ICD-10-CM | POA: Diagnosis not present

## 2012-08-15 DIAGNOSIS — M353 Polymyalgia rheumatica: Secondary | ICD-10-CM | POA: Diagnosis not present

## 2012-08-15 DIAGNOSIS — M949 Disorder of cartilage, unspecified: Secondary | ICD-10-CM | POA: Diagnosis not present

## 2012-08-15 DIAGNOSIS — M899 Disorder of bone, unspecified: Secondary | ICD-10-CM | POA: Diagnosis not present

## 2012-08-15 DIAGNOSIS — H532 Diplopia: Secondary | ICD-10-CM | POA: Diagnosis not present

## 2012-08-22 ENCOUNTER — Other Ambulatory Visit: Payer: Self-pay

## 2012-08-22 MED ORDER — ESOMEPRAZOLE MAGNESIUM 40 MG PO CPDR: 40.0000 mg | DELAYED_RELEASE_CAPSULE | Freq: Every day | ORAL | Status: DC

## 2012-08-24 DIAGNOSIS — H353290 Exudative age-related macular degeneration, unspecified eye, stage unspecified: Secondary | ICD-10-CM | POA: Diagnosis not present

## 2012-08-29 DIAGNOSIS — R42 Dizziness and giddiness: Secondary | ICD-10-CM | POA: Diagnosis not present

## 2012-08-29 DIAGNOSIS — M2669 Other specified disorders of temporomandibular joint: Secondary | ICD-10-CM | POA: Diagnosis not present

## 2012-08-29 DIAGNOSIS — H905 Unspecified sensorineural hearing loss: Secondary | ICD-10-CM | POA: Diagnosis not present

## 2012-08-29 DIAGNOSIS — H903 Sensorineural hearing loss, bilateral: Secondary | ICD-10-CM | POA: Diagnosis not present

## 2012-08-29 DIAGNOSIS — H9319 Tinnitus, unspecified ear: Secondary | ICD-10-CM | POA: Diagnosis not present

## 2012-08-29 DIAGNOSIS — H9209 Otalgia, unspecified ear: Secondary | ICD-10-CM | POA: Diagnosis not present

## 2012-09-05 ENCOUNTER — Ambulatory Visit (INDEPENDENT_AMBULATORY_CARE_PROVIDER_SITE_OTHER): Payer: Medicare Other | Admitting: Internal Medicine

## 2012-09-05 ENCOUNTER — Encounter: Payer: Self-pay | Admitting: Internal Medicine

## 2012-09-05 VITALS — BP 120/60 | HR 74 | Temp 98.3°F | Resp 20 | Wt 174.0 lb

## 2012-09-05 DIAGNOSIS — M353 Polymyalgia rheumatica: Secondary | ICD-10-CM | POA: Diagnosis not present

## 2012-09-05 DIAGNOSIS — I2510 Atherosclerotic heart disease of native coronary artery without angina pectoris: Secondary | ICD-10-CM

## 2012-09-05 DIAGNOSIS — K219 Gastro-esophageal reflux disease without esophagitis: Secondary | ICD-10-CM | POA: Diagnosis not present

## 2012-09-05 NOTE — Patient Instructions (Signed)
Call or return to clinic prn if these symptoms worsen or fail to improve as anticipated.  Return in 4 months for follow-up

## 2012-09-05 NOTE — Progress Notes (Signed)
Subjective:    Patient ID: [name], male    DOB: 1922/12/02, 78 y.o.   MRN: [id number]  HPI  79 year old patient who has a history of stable coronary artery disease hypothyroidism. He states that he is a recent ENT evaluation and once in the 2 dizziness. He states that since August of 2012 he has had intermittent ear pain and vertigo. He's also been seen recently by rheumatology and presently is tapering his prednisone with eventual discontinuation.  Past Medical History  Diagnosis Date  . IBS (irritable bowel syndrome)   . Hemorrhoids   . Diverticulosis   . GERD (gastroesophageal reflux disease)   . Tubular adenoma of colon 07/1998  . Anxiety and depression   . Elevated prostate specific antigen (PSA)   . Vitamin B12 deficiency   . UTI (lower urinary tract infection)   . Arthritis   . Sciatica   . Anemia   . TIA (transient ischemic attack)   . Peptic ulcer disease   . Hyperlipidemia   . CAD (coronary artery disease)     PCI 2006, Stress Perfusion Study 2011  . Hypertension   . Rheumatoid arthritis(714.0)   . Polymyalgia     History   Social History  . Marital Status: Married    Spouse Name: N/A    Number of Children: N/A  . Years of Education: N/A   Occupational History  . Retired    Social History Main Topics  . Smoking status: Former Smoker    Types: Cigarettes    Quit date: 03/29/1972  . Smokeless tobacco: Never Used  . Alcohol Use: No     Comment: occasional  . Drug Use: No  . Sexually Active: Not Currently   Other Topics Concern  . Not on file   Social History Narrative  . No narrative on file    Past Surgical History  Procedure Laterality Date  . Ptca  2006    Stenting of proximal LAD w/ 75 % stenosis reduced to 0%  . Cataract extraction    . Cholecystectomy  2001  . Hernia repair    . Hip fracture surgery  03/2007    Right  . Salivary stone removal  11/2007    Family History  Problem Relation Age of Onset  . Colon cancer Maternal  Uncle 50    ish  . Coronary artery disease Father     Allergies  Allergen Reactions  . Amoxicillin     REACTION: unspecified  . Doxycycline   . Erythromycin     REACTION: unspecified  . Penicillins     rash  . Sulfamethoxazole W-Trimethoprim     unknown    Current Outpatient Prescriptions on File Prior to Visit  Medication Sig Dispense Refill  . aspirin 81 MG tablet Take 81 mg by mouth daily.      Marland Kitchen esomeprazole (NEXIUM) 40 MG capsule Take 1 capsule (40 mg total) by mouth daily.  90 capsule  1  . fluticasone (FLONASE) 50 MCG/ACT nasal spray Place 2 sprays into the nose daily.  16 g  6  . FOLIC ACID PO Take 1 tablet by mouth daily.      . metoprolol succinate (TOPROL-XL) 25 MG 24 hr tablet Take 1 tablet (25 mg total) by mouth daily.  90 tablet  2  . Multiple Vitamins-Minerals (PRESERVISION/LUTEIN PO) Take 1 tablet by mouth 2 (two) times daily.      . nitroGLYCERIN (NITROSTAT) 0.4 MG SL tablet Place 1 tablet (0.4 mg  total) under the tongue every 5 (five) minutes as needed.  25 tablet  11  . predniSONE (DELTASONE) 5 MG tablet Take 5 mg by mouth daily.      . Triamcinolone Acetonide (TRIAMCINOLONE 0.1 % CREAM : EUCERIN) CREA Apply 1 application topically as needed.       Current Facility-Administered Medications on File Prior to Visit  Medication Dose Route Frequency Provider Last Rate Last Dose  . cyanocobalamin ((VITAMIN B-12)) injection 1,000 mcg  1,000 mcg Intramuscular Once Jonita Albee, MD        BP 120/60  Pulse 74  Temp(Src) 98.3 F (36.8 C) (Oral)  Resp 20  Wt 174 lb (78.926 kg)  BMI 25.51 kg/m2  SpO2 97%       Review of Systems  Constitutional: Negative for fever, chills, appetite change and fatigue.  HENT: Positive for hearing loss and ear pain. Negative for congestion, sore throat, trouble swallowing, neck stiffness, dental problem, voice change and tinnitus.   Eyes: Negative for pain, discharge and visual disturbance.  Respiratory: Negative for cough,  chest tightness, wheezing and stridor.   Cardiovascular: Negative for chest pain, palpitations and leg swelling.  Gastrointestinal: Negative for nausea, vomiting, abdominal pain, diarrhea, constipation, blood in stool and abdominal distention.  Genitourinary: Negative for urgency, hematuria, flank pain, discharge, difficulty urinating and genital sores.  Musculoskeletal: Negative for myalgias, back pain, joint swelling, arthralgias and gait problem.  Skin: Negative for rash.  Neurological: Positive for dizziness. Negative for syncope, speech difficulty, weakness, numbness and headaches.  Hematological: Negative for adenopathy. Does not bruise/bleed easily.  Psychiatric/Behavioral: Negative for behavioral problems and dysphoric mood. The patient is not nervous/anxious.        Objective:   Physical Exam  Constitutional: He is oriented to person, place, and time. He appears well-developed.  HENT:  Head: Normocephalic.  Right Ear: External ear normal.  Left Ear: External ear normal.  Eyes: Conjunctivae and EOM are normal.  Neck: Normal range of motion.  Cardiovascular: Normal rate and normal heart sounds.   Pulmonary/Chest: Breath sounds normal.  Abdominal: Bowel sounds are normal.  Musculoskeletal: Normal range of motion. He exhibits no edema and no tenderness.  Neurological: He is alert and oriented to person, place, and time.   Gait slightly unsteady and wide-based  Psychiatric: He has a normal mood and affect. His behavior is normal.          Assessment & Plan:   Coronary artery disease Intermittent ear pain History of TMJ  Patient was reassured Return in the fall for followup or as needed

## 2012-09-08 DIAGNOSIS — H532 Diplopia: Secondary | ICD-10-CM | POA: Diagnosis not present

## 2012-09-08 DIAGNOSIS — H47019 Ischemic optic neuropathy, unspecified eye: Secondary | ICD-10-CM | POA: Diagnosis not present

## 2012-09-08 DIAGNOSIS — H5032 Intermittent alternating esotropia: Secondary | ICD-10-CM | POA: Diagnosis not present

## 2012-09-15 DIAGNOSIS — G501 Atypical facial pain: Secondary | ICD-10-CM | POA: Diagnosis not present

## 2012-09-15 DIAGNOSIS — H5032 Intermittent alternating esotropia: Secondary | ICD-10-CM | POA: Diagnosis not present

## 2012-09-15 DIAGNOSIS — H47019 Ischemic optic neuropathy, unspecified eye: Secondary | ICD-10-CM | POA: Diagnosis not present

## 2012-09-15 DIAGNOSIS — H532 Diplopia: Secondary | ICD-10-CM | POA: Diagnosis not present

## 2012-09-28 DIAGNOSIS — H353290 Exudative age-related macular degeneration, unspecified eye, stage unspecified: Secondary | ICD-10-CM | POA: Diagnosis not present

## 2012-10-05 DIAGNOSIS — H532 Diplopia: Secondary | ICD-10-CM | POA: Diagnosis not present

## 2012-10-05 DIAGNOSIS — H47019 Ischemic optic neuropathy, unspecified eye: Secondary | ICD-10-CM | POA: Diagnosis not present

## 2012-10-05 DIAGNOSIS — H5032 Intermittent alternating esotropia: Secondary | ICD-10-CM | POA: Diagnosis not present

## 2012-10-12 ENCOUNTER — Telehealth: Payer: Self-pay | Admitting: Internal Medicine

## 2012-10-12 NOTE — Telephone Encounter (Signed)
Spoke to pt told him sorry did not get back to him sooner. Asked pt if he can come in tomorrow at 11:00 am to see Dr. Kirtland Bouchard Pt said yes. Told pt okay see him tomorrow. Appointment scheduled.

## 2012-10-12 NOTE — Telephone Encounter (Signed)
Patient Information:  Caller Name: [name]  Phone: [phone]  Patient: [name], [name]  Gender: Male  DOB: 1922-05-01  Age: 79 Years  PCP: Eleonore Chiquito [facility])  Office Follow Up:  Does the office need to follow up with this patient?: Yes  Instructions For The Office: Please call back ASAP regarding work in appointment.  RN Note:  Requesting appointment to meet with Dr Amador Cunas. Headache pain rated 3-4/10.   Would like to have ENT referral changed from Dr Jearld Fenton, due to a personal issue, to another MD he looked up.  Noted numbness in right arm and leg after began Acyclovir BID that resolved when MD decreased Acyclovir to qd. No appointments remain with Dr Amador Cunas; message sent to staff for follow up regarding work in appointment.  Caller agrees to come today or tomorrow but wants to see only Dr. Amador Cunas.  Symptoms  Reason For Call & Symptoms: Emergent Call:  Regarding intermittent headache with pain in forehead and left side of head.  Saw Dr Ovidio Kin, a "neuro eye doctor" for double vision on 10/12/12. Double vision treated with special lenses. Blood test done. Was told has no neurological issue but MD suspected shingles.  Ordered Acyclovir.  Concerned about conflicting diagnoses from different MDs.  ENT said he had an ear infection and ordered ear drops  Reviewed Health History In EMR: Yes  Reviewed Medications In EMR: Yes  Reviewed Allergies In EMR: Yes  Reviewed Surgeries / Procedures: Yes  Date of Onset of Symptoms: 09/28/2012  Treatments Tried: Acyclovir  Treatments Tried Worked: No  Guideline(s) Used:  Headache  Disposition Per Guideline:   See Today in Office  Reason For Disposition Reached:   Patient wants to be seen  Advice Given:  Call Back If:  Headache lasts longer than 24 hours  You become worse.  Patient Will Follow Care Advice:  YES

## 2012-10-13 ENCOUNTER — Encounter: Payer: Self-pay | Admitting: Internal Medicine

## 2012-10-13 ENCOUNTER — Ambulatory Visit (INDEPENDENT_AMBULATORY_CARE_PROVIDER_SITE_OTHER): Payer: Medicare Other | Admitting: Internal Medicine

## 2012-10-13 VITALS — BP 120/60 | HR 89 | Temp 98.1°F | Resp 20 | Wt 175.0 lb

## 2012-10-13 DIAGNOSIS — M353 Polymyalgia rheumatica: Secondary | ICD-10-CM

## 2012-10-13 DIAGNOSIS — M479 Spondylosis, unspecified: Secondary | ICD-10-CM

## 2012-10-13 DIAGNOSIS — H9209 Otalgia, unspecified ear: Secondary | ICD-10-CM

## 2012-10-13 DIAGNOSIS — F341 Dysthymic disorder: Secondary | ICD-10-CM | POA: Diagnosis not present

## 2012-10-13 DIAGNOSIS — H9203 Otalgia, bilateral: Secondary | ICD-10-CM

## 2012-10-13 MED ORDER — TRAMADOL HCL 50 MG PO TABS: 50.0000 mg | ORAL_TABLET | Freq: Three times a day (TID) | ORAL | Status: DC | PRN

## 2012-10-13 NOTE — Patient Instructions (Signed)
ENT followup as discussed

## 2012-10-13 NOTE — Progress Notes (Signed)
Subjective:    Patient ID: [name], male    DOB: 1922-11-12, 79 y.o.   MRN: [id number]  HPI  79 year old patient who is seen today with a chief complaint of headaches. He states that he has had periorbital and frontal headaches for about 2 weeks. He initially was seen by his eye doctor and referred to neurology due 2 headaches.  The patient was treated with acyclovir from neurology  which the patient takes on a when necessary basis which he feels has been helpful.  He states he does have a remote history of shingles but this involved the lumbar region.  He has a long history of intermittent bilateral ear pain and was also referred back to ENT. The patient requests a new referral to Dr. Dorma Russell.  He states that he has been treated intermittently with Ciprodex with temporary benefit. Early this morning he states that he has had the onset of recurrent right ear discomfort. In March of this year he states he was evaluated by ENT do to vertigo and at that time had a fever of 103  Chronic medical problems include cervical arthritis anxiety depression history of TMJ and history of polymyalgia rheumatica. He is also being followed by rheumatology and apparently is on a tapering dose of prednisone now 3 mg;  he states that he also takes acyclovir 1 daily  Past Medical History  Diagnosis Date  . IBS (irritable bowel syndrome)   . Hemorrhoids   . Diverticulosis   . GERD (gastroesophageal reflux disease)   . Tubular adenoma of colon 07/1998  . Anxiety and depression   . Elevated prostate specific antigen (PSA)   . Vitamin B12 deficiency   . UTI (lower urinary tract infection)   . Arthritis   . Sciatica   . Anemia   . TIA (transient ischemic attack)   . Peptic ulcer disease   . Hyperlipidemia   . CAD (coronary artery disease)     PCI 2006, Stress Perfusion Study 2011  . Hypertension   . Rheumatoid arthritis(714.0)   . Polymyalgia     History   Social History  . Marital Status: Married     Spouse Name: N/A    Number of Children: N/A  . Years of Education: N/A   Occupational History  . Retired    Social History Main Topics  . Smoking status: Former Smoker    Types: Cigarettes    Quit date: 03/29/1972  . Smokeless tobacco: Never Used  . Alcohol Use: No     Comment: occasional  . Drug Use: No  . Sexually Active: Not Currently   Other Topics Concern  . Not on file   Social History Narrative  . No narrative on file    Past Surgical History  Procedure Laterality Date  . Ptca  2006    Stenting of proximal LAD w/ 75 % stenosis reduced to 0%  . Cataract extraction    . Cholecystectomy  2001  . Hernia repair    . Hip fracture surgery  03/2007    Right  . Salivary stone removal  11/2007    Family History  Problem Relation Age of Onset  . Colon cancer Maternal Uncle 50    ish  . Coronary artery disease Father     Allergies  Allergen Reactions  . Amoxicillin     REACTION: unspecified  . Doxycycline   . Erythromycin     REACTION: unspecified  . Penicillins     rash  .  Sulfamethoxazole W-Trimethoprim     unknown    Current Outpatient Prescriptions on File Prior to Visit  Medication Sig Dispense Refill  . aspirin 81 MG tablet Take 81 mg by mouth daily.      Marland Kitchen Co-Enzyme Q-10 100 MG CAPS Take 300 mg by mouth daily.      . cyanocobalamin 500 MCG tablet Take 500 mcg by mouth daily.      Marland Kitchen desonide (DESOWEN) 0.05 % ointment Apply 1 application topically as needed.      Marland Kitchen Dextromethorphan-Guaifenesin (MUCINEX DM PO) Take 1 capsule by mouth daily.      . fluticasone (FLONASE) 50 MCG/ACT nasal spray Place 2 sprays into the nose daily.  16 g  6  . FOLIC ACID PO Take 1 tablet by mouth daily.      . metoprolol succinate (TOPROL-XL) 25 MG 24 hr tablet Take 1 tablet (25 mg total) by mouth daily.  90 tablet  2  . Multiple Vitamins-Minerals (PRESERVISION/LUTEIN PO) Take 1 tablet by mouth 2 (two) times daily.      . nitroGLYCERIN (NITROSTAT) 0.4 MG SL tablet Place 1  tablet (0.4 mg total) under the tongue every 5 (five) minutes as needed.  25 tablet  11  . predniSONE (DELTASONE) 5 MG tablet Take 5 mg by mouth daily.      . Ranibizumab (LUCENTIS IO) Inject 1 each into the eye every 6 (six) weeks.      . Triamcinolone Acetonide (TRIAMCINOLONE 0.1 % CREAM : EUCERIN) CREA Apply 1 application topically as needed.       Current Facility-Administered Medications on File Prior to Visit  Medication Dose Route Frequency Provider Last Rate Last Dose  . cyanocobalamin ((VITAMIN B-12)) injection 1,000 mcg  1,000 mcg Intramuscular Once Jonita Albee, MD        BP 120/60  Pulse 89  Temp(Src) 98.1 F (36.7 C) (Oral)  Resp 20  Wt 175 lb (79.379 kg)  BMI 25.66 kg/m2  SpO2 97%       Review of Systems  Constitutional: Negative for fever, chills, appetite change and fatigue.  HENT: Positive for ear pain. Negative for hearing loss, congestion, sore throat, trouble swallowing, neck stiffness, dental problem, voice change and tinnitus.   Eyes: Negative for pain, discharge and visual disturbance.  Respiratory: Negative for cough, chest tightness, wheezing and stridor.   Cardiovascular: Negative for chest pain, palpitations and leg swelling.  Gastrointestinal: Negative for nausea, vomiting, abdominal pain, diarrhea, constipation, blood in stool and abdominal distention.  Genitourinary: Negative for urgency, hematuria, flank pain, discharge, difficulty urinating and genital sores.  Musculoskeletal: Positive for arthralgias. Negative for myalgias, back pain, joint swelling and gait problem.  Skin: Negative for rash.  Neurological: Positive for light-headedness and headaches. Negative for dizziness, syncope, speech difficulty, weakness and numbness.  Hematological: Negative for adenopathy. Does not bruise/bleed easily.  Psychiatric/Behavioral: Negative for behavioral problems and dysphoric mood. The patient is not nervous/anxious.        Objective:   Physical Exam   Constitutional: He is oriented to person, place, and time. He appears well-developed.  HENT:  Head: Normocephalic.  Right Ear: External ear normal.  Left Ear: External ear normal.  The left tympanic membrane was normal The right tympanic membrane was not erythematous but did have scattered tiny whitish papules/pustules scattered over the tympanic membrane  No TMJ tenderness No peri auricular adenopathy  Eyes: Conjunctivae and EOM are normal.  Neck: Normal range of motion.  Cardiovascular: Normal rate and normal heart  sounds.   Pulmonary/Chest: Breath sounds normal.  Abdominal: Bowel sounds are normal.  Musculoskeletal: Normal range of motion. He exhibits no edema and no tenderness.  Neurological: He is alert and oriented to person, place, and time.  Psychiatric: He has a normal mood and affect. His behavior is normal.          Assessment & Plan:   Intermittent headaches Episodic bilateral ear pain. The patient has requested referral to Dr. Dorma Russell Cervical arthritis Remote history of shingles

## 2012-10-16 ENCOUNTER — Telehealth: Payer: Self-pay

## 2012-10-16 NOTE — Telephone Encounter (Signed)
[name] came in to check and see if a referral had been done for him to see Dr Tenny Craw [phone], please call him at [phone] to let him know.

## 2012-10-16 NOTE — Telephone Encounter (Signed)
Pt notified referral was sent and someone will get in touch with him. Pt verbalized understanding.

## 2012-10-30 DIAGNOSIS — H905 Unspecified sensorineural hearing loss: Secondary | ICD-10-CM | POA: Diagnosis not present

## 2012-10-30 DIAGNOSIS — H9319 Tinnitus, unspecified ear: Secondary | ICD-10-CM | POA: Diagnosis not present

## 2012-10-31 ENCOUNTER — Other Ambulatory Visit: Payer: Self-pay | Admitting: *Deleted

## 2012-10-31 ENCOUNTER — Other Ambulatory Visit: Payer: Self-pay | Admitting: Otolaryngology

## 2012-10-31 DIAGNOSIS — H905 Unspecified sensorineural hearing loss: Secondary | ICD-10-CM

## 2012-10-31 DIAGNOSIS — H9319 Tinnitus, unspecified ear: Secondary | ICD-10-CM

## 2012-10-31 DIAGNOSIS — G459 Transient cerebral ischemic attack, unspecified: Secondary | ICD-10-CM

## 2012-10-31 DIAGNOSIS — R0989 Other specified symptoms and signs involving the circulatory and respiratory systems: Secondary | ICD-10-CM

## 2012-10-31 MED ORDER — NITROGLYCERIN 0.4 MG SL SUBL: 0.4000 mg | SUBLINGUAL_TABLET | SUBLINGUAL | Status: AC | PRN

## 2012-11-01 ENCOUNTER — Ambulatory Visit
Admission: RE | Admit: 2012-11-01 | Discharge: 2012-11-01 | Disposition: A | Discharge status: Home / Self Care | Payer: Medicare Other | Source: Ambulatory Visit | Attending: Otolaryngology | Admitting: Otolaryngology

## 2012-11-01 DIAGNOSIS — I658 Occlusion and stenosis of other precerebral arteries: Secondary | ICD-10-CM | POA: Diagnosis not present

## 2012-11-01 DIAGNOSIS — R0989 Other specified symptoms and signs involving the circulatory and respiratory systems: Secondary | ICD-10-CM

## 2012-11-02 ENCOUNTER — Ambulatory Visit (INDEPENDENT_AMBULATORY_CARE_PROVIDER_SITE_OTHER): Payer: Medicare Other | Admitting: Family Medicine

## 2012-11-02 ENCOUNTER — Encounter: Payer: Self-pay | Admitting: Family Medicine

## 2012-11-02 ENCOUNTER — Ambulatory Visit: Payer: Medicare Other

## 2012-11-02 VITALS — BP 140/78 | HR 70 | Temp 97.5°F | Resp 16 | Ht 70.0 in | Wt 170.0 lb

## 2012-11-02 DIAGNOSIS — R142 Eructation: Secondary | ICD-10-CM

## 2012-11-02 DIAGNOSIS — M549 Dorsalgia, unspecified: Secondary | ICD-10-CM

## 2012-11-02 DIAGNOSIS — K5900 Constipation, unspecified: Secondary | ICD-10-CM

## 2012-11-02 DIAGNOSIS — R109 Unspecified abdominal pain: Secondary | ICD-10-CM

## 2012-11-02 DIAGNOSIS — R079 Chest pain, unspecified: Secondary | ICD-10-CM

## 2012-11-02 DIAGNOSIS — R143 Flatulence: Secondary | ICD-10-CM | POA: Diagnosis not present

## 2012-11-02 DIAGNOSIS — R141 Gas pain: Secondary | ICD-10-CM

## 2012-11-02 DIAGNOSIS — R319 Hematuria, unspecified: Secondary | ICD-10-CM

## 2012-11-02 LAB — POCT CBC
Hemoglobin: 14.5 g/dL (ref 14.1–18.1)
Lymph, poc: 1.6 (ref 0.6–3.4)
MCH, POC: 30.6 pg (ref 27–31.2)
MCHC: 31.8 g/dL (ref 31.8–35.4)
MID (cbc): 0.7 (ref 0–0.9)
MPV: 9.3 fL (ref 0–99.8)
POC Granulocyte: 6 (ref 2–6.9)
POC LYMPH PERCENT: 19 %L (ref 10–50)
POC MID %: 8.3 %M (ref 0–12)
Platelet Count, POC: 180 10*3/uL (ref 142–424)
RDW, POC: 14.5 %

## 2012-11-02 LAB — IFOBT (OCCULT BLOOD): IFOBT: NEGATIVE

## 2012-11-02 LAB — POCT URINALYSIS DIPSTICK
Glucose, UA: NEGATIVE
Nitrite, UA: NEGATIVE
Protein, UA: 30
Urobilinogen, UA: 0.2

## 2012-11-02 LAB — POCT UA - MICROSCOPIC ONLY
Casts, Ur, LPF, POC: NEGATIVE
Crystals, Ur, HPF, POC: NEGATIVE
Yeast, UA: NEGATIVE

## 2012-11-02 MED ORDER — TAMSULOSIN HCL 0.4 MG PO CAPS: 0.4000 mg | ORAL_CAPSULE | Freq: Every day | ORAL | Status: DC

## 2012-11-02 MED ORDER — RANITIDINE HCL 150 MG PO TABS: 150.0000 mg | ORAL_TABLET | Freq: Every day | ORAL | Status: DC

## 2012-11-02 NOTE — Progress Notes (Signed)
[address]   [place], [place]  [address]   [phone]  Subjective:    Patient ID: [name], male    DOB: [birth year], 79 y.o.   MRN: [id number]  HPI This 79 y.o. male presents for evaluation of lower back pain, RLQ pain.  Onset this morning at 5:00am.  Awoke with severe R lower back pain; then pain radiated to RLQ.  Administered enema with relief in R back pain and RLQ pain; had good normal b.m. Today.  No fever/chills/sweats.  No nausea, vomiting, diarrhea; feels constipated.  +belching excessively today; feels full of gas.  No flatus today.  No dysuria, hematuria, urgency, nocturia.  +frequency. History of kidney stones x 1 years ago; followed by urologist Logan Bores and [place]).  Had chest tightness today as well while at rest; no associated SOB, nausea, diaphoresis, radiation into arm or neck or back.  Appetite decreased.  Did eat today.  Colonoscopy in past month by Russella Dar.  No pain radiating into groin region; no testicular pain or swelling.  Has been very dizzy intermittently since 05/2012; undergoing evaluation currently by ENT specialist.  S/p carotid dopplers yesterday; scheduled for MRI brain tomorrow.     Review of Systems  Constitutional: Negative for chills, diaphoresis and fatigue.  Respiratory: Negative for cough, shortness of breath, wheezing and stridor.   Cardiovascular: Positive for chest pain. Negative for palpitations and leg swelling.  Gastrointestinal: Positive for abdominal pain and abdominal distention. Negative for nausea, vomiting, diarrhea, constipation, blood in stool and anal bleeding.  Genitourinary: Positive for frequency and flank pain. Negative for dysuria, urgency, hematuria, penile swelling, scrotal swelling, difficulty urinating and testicular pain.  Musculoskeletal: Positive for back pain. Negative for myalgias.  Neurological: Positive for dizziness.    Past Medical History  Diagnosis Date  . IBS (irritable bowel syndrome)   . Hemorrhoids   .  Diverticulosis   . GERD (gastroesophageal reflux disease)   . Tubular adenoma of colon 07/1998  . Anxiety and depression   . Elevated prostate specific antigen (PSA)   . Vitamin B12 deficiency   . UTI (lower urinary tract infection)   . Arthritis   . Sciatica   . Anemia   . TIA (transient ischemic attack)   . Peptic ulcer disease   . Hyperlipidemia   . CAD (coronary artery disease)     PCI 2006, Stress Perfusion Study 2011  . Hypertension   . Rheumatoid arthritis(714.0)   . Polymyalgia     Past Surgical History  Procedure Laterality Date  . Ptca  2006    Stenting of proximal LAD w/ 75 % stenosis reduced to 0%  . Cataract extraction    . Cholecystectomy  2001  . Hernia repair    . Hip fracture surgery  03/2007    Right  . Salivary stone removal  11/2007    Prior to Admission medications   Medication Sig Start Date End Date Taking? Authorizing Provider  acyclovir (ZOVIRAX) 400 MG tablet Take 400 mg by mouth daily.  10/05/12   Historical Provider, MD  aspirin 81 MG tablet Take 81 mg by mouth daily.    Historical Provider, MD  Co-Enzyme Q-10 100 MG CAPS Take 300 mg by mouth daily.    Historical Provider, MD  cyanocobalamin 500 MCG tablet Take 500 mcg by mouth daily.    Historical Provider, MD  desonide (DESOWEN) 0.05 % ointment Apply 1 application topically as needed.    Historical Provider, MD  Dextromethorphan-Guaifenesin [facility] DM PO)  Take 1 capsule by mouth daily.    Historical Provider, MD  fluticasone (FLONASE) 50 MCG/ACT nasal spray Place 2 sprays into the nose daily. 06/23/12   Gordy Savers, MD  FOLIC ACID PO Take 1 tablet by mouth daily.    Historical Provider, MD  metoprolol succinate (TOPROL-XL) 25 MG 24 hr tablet Take 1 tablet (25 mg total) by mouth daily. 04/10/12 04/10/13  Rollene Rotunda, MD  Multiple Vitamins-Minerals (PRESERVISION/LUTEIN PO) Take 1 tablet by mouth 2 (two) times daily.    Historical Provider, MD  NEXIUM 40 MG capsule Take 40 mg by mouth daily  before breakfast.  08/22/12   Historical Provider, MD  nitroGLYCERIN (NITROSTAT) 0.4 MG SL tablet Place 1 tablet (0.4 mg total) under the tongue every 5 (five) minutes as needed. 10/31/12   Rollene Rotunda, MD  predniSONE (DELTASONE) 5 MG tablet Take 5 mg by mouth daily.    Historical Provider, MD  Ranibizumab (LUCENTIS IO) Inject 1 each into the eye every 6 (six) weeks.    Historical Provider, MD  traMADol (ULTRAM) 50 MG tablet Take 1 tablet (50 mg total) by mouth every 8 (eight) hours as needed for pain. 10/13/12   Gordy Savers, MD  Triamcinolone Acetonide (TRIAMCINOLONE 0.1 % CREAM : EUCERIN) CREA Apply 1 application topically as needed.    Historical Provider, MD    Allergies  Allergen Reactions  . Amoxicillin     REACTION: unspecified  . Doxycycline   . Erythromycin     REACTION: unspecified  . Penicillins     rash  . Sulfamethoxazole W-Trimethoprim     unknown    History   Social History  . Marital Status: Married    Spouse Name: N/A    Number of Children: N/A  . Years of Education: N/A   Occupational History  . Retired    Social History Main Topics  . Smoking status: Former Smoker    Types: Cigarettes    Quit date: 03/29/1972  . Smokeless tobacco: Never Used  . Alcohol Use: No     Comment: occasional  . Drug Use: No  . Sexually Active: Not Currently   Other Topics Concern  . Not on file   Social History Narrative  . No narrative on file    Family History  Problem Relation Age of Onset  . Colon cancer Maternal Uncle 50    ish  . Coronary artery disease Father        Objective:   Physical Exam  Nursing note and vitals reviewed. Constitutional: He is oriented to person, place, and time. He appears well-developed and well-nourished. No distress.  HENT:  Head: Normocephalic and atraumatic.  Right Ear: External ear normal.  Left Ear: External ear normal.  Nose: Nose normal.  Mouth/Throat: Oropharynx is clear and moist.  Eyes: Conjunctivae and EOM  are normal. Pupils are equal, round, and reactive to light.  Neck: Normal range of motion. Neck supple. No thyromegaly present.  Cardiovascular: Normal rate and regular rhythm.   Pulmonary/Chest: Effort normal and breath sounds normal.  Abdominal: Soft. Bowel sounds are normal. He exhibits distension. He exhibits no mass. There is no hepatosplenomegaly. There is tenderness in the right lower quadrant, suprapubic area and left lower quadrant. There is guarding. There is no rigidity, no rebound and no CVA tenderness.  Intermittent guarding RLQ, suprapubic, LLQ region.  Genitourinary: Rectum normal. Rectal exam shows no mass and no tenderness.  Lymphadenopathy:    He has no cervical adenopathy.  Neurological:  He is alert and oriented to person, place, and time.  Unsteady gait.  Skin: Skin is warm and dry. No rash noted. He is not diaphoretic.  Psychiatric: He has a normal mood and affect. His behavior is normal.   [facility] reading (PRIMARY) by  Dr. Katrinka Blazing.  AAS:  No free air; +cholecystectomy clips; NAD. Large stool burden.  Results for orders placed in visit on 11/02/12  POCT CBC      Result Value Range   WBC 8.2  4.6 - 10.2 K/uL   Lymph, poc 1.6  0.6 - 3.4   POC LYMPH PERCENT 19.0  10 - 50 %L   MID (cbc) 0.7  0 - 0.9   POC MID % 8.3  0 - 12 %M   POC Granulocyte 6.0  2 - 6.9   Granulocyte percent 72.7  37 - 80 %G   RBC 4.74  4.69 - 6.13 M/uL   Hemoglobin 14.5  14.1 - 18.1 g/dL   HCT, POC 45.4  09.8 - 53.7 %   MCV 96.3  80 - 97 fL   MCH, POC 30.6  27 - 31.2 pg   MCHC 31.8  31.8 - 35.4 g/dL   RDW, POC 11.9     Platelet Count, POC 180  142 - 424 K/uL   MPV 9.3  0 - 99.8 fL  IFOBT (OCCULT BLOOD)      Result Value Range   IFOBT Negative    POCT UA - MICROSCOPIC ONLY      Result Value Range   WBC, Ur, HPF, POC 1-3     RBC, urine, microscopic tntc     Bacteria, U Microscopic 1+     Mucus, UA pos     Epithelial cells, urine per micros 0-1     Crystals, Ur, HPF, POC neg     Casts, Ur,  LPF, POC neg     Yeast, UA neg    POCT URINALYSIS DIPSTICK      Result Value Range   Color, UA yellow     Clarity, UA clear     Glucose, UA neg     Bilirubin, UA neg     Ketones, UA neg     Spec Grav, UA 1.025     Blood, UA mod     pH, UA 6.0     Protein, UA 30     Urobilinogen, UA 0.2     Nitrite, UA neg     Leukocytes, UA Negative         Assessment & Plan:  Back pain - Plan: POCT UA - Microscopic Only, POCT urinalysis dipstick  Abdominal pain - Plan: POCT CBC, IFOBT POC (occult bld, rslt in office), POCT UA - Microscopic Only, POCT urinalysis dipstick, Comprehensive metabolic panel, DG Abd Acute W/Chest  Chest pain - Plan: Comprehensive metabolic panel, EKG 12-Lead  Belching - Plan: DG Abd Acute W/Chest  Hematuria  Unspecified constipation   1.  R flank pain: New. Associated with pain radiating into RLQ; associated with microscopic hematuria.  Highly suggestive of nephrolithiasis.  Recommend CT in morning but patient scheduled for MRI brain in morning; agreeable to call Urologist tomorrow.   2.  RLQ pain: New.  Associated with R flank pain, hematuria.  Suggestive of nephrolithiasis.   3. Hematuria:  New.  With R flank pain and RLQ pain; suggestive of stone; rx for Flomax provided; recommend using Tramadol for pain overnight; contact urology in morning for appointment this week; hydrate aggressively. 4.  Constipation:  New.  Recommend stool softener daily; can use enema again in upcoming 24 hours.   5. Belching: :  New.  Add Zantac 150mg  qhs to Nexium 40mg  daily. 6. Chest pain:  New.  Atypical; EKG stable.    Meds ordered this encounter  Medications  . tamsulosin (FLOMAX) 0.4 MG CAPS capsule    Sig: Take 1 capsule (0.4 mg total) by mouth daily.    Dispense:  30 capsule    Refill:  0  . ranitidine (ZANTAC) 150 MG tablet    Sig: Take 1 tablet (150 mg total) by mouth at bedtime.    Dispense:  30 tablet    Refill:  0

## 2012-11-02 NOTE — Patient Instructions (Addendum)
1.  Recommend starting a stool softener daily; OK to use enema tonight. 2.  Call Dr. Logan Bores or Annabell Howells in morning for appointment for possible kidney stone.   3. Return for worsening pain.  Take Tramadol for pain.

## 2012-11-03 ENCOUNTER — Ambulatory Visit
Admission: RE | Admit: 2012-11-03 | Discharge: 2012-11-03 | Disposition: A | Discharge status: Home / Self Care | Payer: Medicare Other | Source: Ambulatory Visit | Attending: Otolaryngology | Admitting: Otolaryngology

## 2012-11-03 DIAGNOSIS — H9209 Otalgia, unspecified ear: Secondary | ICD-10-CM | POA: Diagnosis not present

## 2012-11-03 DIAGNOSIS — H9319 Tinnitus, unspecified ear: Secondary | ICD-10-CM

## 2012-11-03 DIAGNOSIS — H905 Unspecified sensorineural hearing loss: Secondary | ICD-10-CM

## 2012-11-03 DIAGNOSIS — Z87442 Personal history of urinary calculi: Secondary | ICD-10-CM | POA: Diagnosis not present

## 2012-11-03 DIAGNOSIS — G459 Transient cerebral ischemic attack, unspecified: Secondary | ICD-10-CM

## 2012-11-03 DIAGNOSIS — H9190 Unspecified hearing loss, unspecified ear: Secondary | ICD-10-CM | POA: Diagnosis not present

## 2012-11-03 LAB — COMPREHENSIVE METABOLIC PANEL
AST: 20 U/L (ref 0–37)
Albumin: 4 g/dL (ref 3.5–5.2)
Alkaline Phosphatase: 68 U/L (ref 39–117)
BUN: 34 mg/dL — ABNORMAL HIGH (ref 6–23)
Glucose, Bld: 103 mg/dL — ABNORMAL HIGH (ref 70–99)
Potassium: 4.4 mEq/L (ref 3.5–5.3)
Sodium: 144 mEq/L (ref 135–145)
Total Bilirubin: 0.6 mg/dL (ref 0.3–1.2)
Total Protein: 6.7 g/dL (ref 6.0–8.3)

## 2012-11-03 MED ORDER — GADOBENATE DIMEGLUMINE 529 MG/ML IV SOLN
8.0000 mL | Freq: Once | INTRAVENOUS | Status: AC | PRN
  Administered 2012-11-03: 8 mL via INTRAVENOUS

## 2012-11-06 DIAGNOSIS — H5032 Intermittent alternating esotropia: Secondary | ICD-10-CM | POA: Diagnosis not present

## 2012-11-06 DIAGNOSIS — H9319 Tinnitus, unspecified ear: Secondary | ICD-10-CM | POA: Diagnosis not present

## 2012-11-06 DIAGNOSIS — H532 Diplopia: Secondary | ICD-10-CM | POA: Diagnosis not present

## 2012-11-06 DIAGNOSIS — H47019 Ischemic optic neuropathy, unspecified eye: Secondary | ICD-10-CM | POA: Diagnosis not present

## 2012-11-06 DIAGNOSIS — H905 Unspecified sensorineural hearing loss: Secondary | ICD-10-CM | POA: Diagnosis not present

## 2012-11-06 DIAGNOSIS — H348392 Tributary (branch) retinal vein occlusion, unspecified eye, stable: Secondary | ICD-10-CM | POA: Diagnosis not present

## 2012-11-07 ENCOUNTER — Encounter (HOSPITAL_COMMUNITY): Payer: Self-pay | Admitting: Emergency Medicine

## 2012-11-07 ENCOUNTER — Emergency Department (HOSPITAL_COMMUNITY): Payer: Medicare Other

## 2012-11-07 ENCOUNTER — Telehealth: Payer: Self-pay | Admitting: Internal Medicine

## 2012-11-07 ENCOUNTER — Inpatient Hospital Stay (HOSPITAL_COMMUNITY)
Admission: EM | Admit: 2012-11-07 | Discharge: 2012-11-11 | DRG: 193 | Disposition: A | Discharge status: Home / Self Care | Payer: Medicare Other | Attending: Internal Medicine | Admitting: Internal Medicine

## 2012-11-07 DIAGNOSIS — H9319 Tinnitus, unspecified ear: Secondary | ICD-10-CM | POA: Diagnosis present

## 2012-11-07 DIAGNOSIS — Z8679 Personal history of other diseases of the circulatory system: Secondary | ICD-10-CM | POA: Diagnosis not present

## 2012-11-07 DIAGNOSIS — L988 Other specified disorders of the skin and subcutaneous tissue: Secondary | ICD-10-CM | POA: Diagnosis present

## 2012-11-07 DIAGNOSIS — Z87891 Personal history of nicotine dependence: Secondary | ICD-10-CM

## 2012-11-07 DIAGNOSIS — IMO0002 Reserved for concepts with insufficient information to code with codable children: Secondary | ICD-10-CM

## 2012-11-07 DIAGNOSIS — H3530 Unspecified macular degeneration: Secondary | ICD-10-CM

## 2012-11-07 DIAGNOSIS — I671 Cerebral aneurysm, nonruptured: Secondary | ICD-10-CM

## 2012-11-07 DIAGNOSIS — D72829 Elevated white blood cell count, unspecified: Secondary | ICD-10-CM | POA: Diagnosis present

## 2012-11-07 DIAGNOSIS — M353 Polymyalgia rheumatica: Secondary | ICD-10-CM | POA: Diagnosis not present

## 2012-11-07 DIAGNOSIS — E538 Deficiency of other specified B group vitamins: Secondary | ICD-10-CM | POA: Diagnosis present

## 2012-11-07 DIAGNOSIS — K279 Peptic ulcer, site unspecified, unspecified as acute or chronic, without hemorrhage or perforation: Secondary | ICD-10-CM | POA: Diagnosis present

## 2012-11-07 DIAGNOSIS — R413 Other amnesia: Secondary | ICD-10-CM | POA: Diagnosis present

## 2012-11-07 DIAGNOSIS — E039 Hypothyroidism, unspecified: Secondary | ICD-10-CM | POA: Diagnosis present

## 2012-11-07 DIAGNOSIS — D696 Thrombocytopenia, unspecified: Secondary | ICD-10-CM | POA: Diagnosis present

## 2012-11-07 DIAGNOSIS — Z7982 Long term (current) use of aspirin: Secondary | ICD-10-CM | POA: Diagnosis not present

## 2012-11-07 DIAGNOSIS — Z8673 Personal history of transient ischemic attack (TIA), and cerebral infarction without residual deficits: Secondary | ICD-10-CM

## 2012-11-07 DIAGNOSIS — R51 Headache: Secondary | ICD-10-CM | POA: Diagnosis not present

## 2012-11-07 DIAGNOSIS — D649 Anemia, unspecified: Secondary | ICD-10-CM | POA: Diagnosis not present

## 2012-11-07 DIAGNOSIS — G08 Intracranial and intraspinal phlebitis and thrombophlebitis: Secondary | ICD-10-CM | POA: Diagnosis present

## 2012-11-07 DIAGNOSIS — I2510 Atherosclerotic heart disease of native coronary artery without angina pectoris: Secondary | ICD-10-CM | POA: Diagnosis present

## 2012-11-07 DIAGNOSIS — J189 Pneumonia, unspecified organism: Secondary | ICD-10-CM | POA: Diagnosis present

## 2012-11-07 DIAGNOSIS — Z79899 Other long term (current) drug therapy: Secondary | ICD-10-CM | POA: Diagnosis not present

## 2012-11-07 DIAGNOSIS — L089 Local infection of the skin and subcutaneous tissue, unspecified: Secondary | ICD-10-CM | POA: Diagnosis not present

## 2012-11-07 DIAGNOSIS — R509 Fever, unspecified: Secondary | ICD-10-CM | POA: Diagnosis not present

## 2012-11-07 DIAGNOSIS — I10 Essential (primary) hypertension: Secondary | ICD-10-CM | POA: Diagnosis present

## 2012-11-07 DIAGNOSIS — B029 Zoster without complications: Secondary | ICD-10-CM

## 2012-11-07 DIAGNOSIS — Z9861 Coronary angioplasty status: Secondary | ICD-10-CM

## 2012-11-07 DIAGNOSIS — R0602 Shortness of breath: Secondary | ICD-10-CM | POA: Diagnosis not present

## 2012-11-07 DIAGNOSIS — R079 Chest pain, unspecified: Secondary | ICD-10-CM | POA: Diagnosis not present

## 2012-11-07 LAB — COMPREHENSIVE METABOLIC PANEL
ALT: 14 U/L (ref 0–53)
Alkaline Phosphatase: 70 U/L (ref 39–117)
BUN: 20 mg/dL (ref 6–23)
BUN: 20 mg/dL (ref 6–23)
CO2: 29 mEq/L (ref 19–32)
CO2: 29 mEq/L (ref 19–32)
Calcium: 9.4 mg/dL (ref 8.4–10.5)
Chloride: 99 mEq/L (ref 96–112)
Creatinine, Ser: 1.33 mg/dL (ref 0.50–1.35)
GFR calc Af Amer: 53 mL/min — ABNORMAL LOW (ref >90)
GFR calc Af Amer: 53 mL/min — ABNORMAL LOW (ref >90)
GFR calc non Af Amer: 46 mL/min — ABNORMAL LOW (ref >90)
Glucose, Bld: 122 mg/dL — ABNORMAL HIGH (ref 70–99)
Glucose, Bld: 105 mg/dL — ABNORMAL HIGH (ref 70–99)
Potassium: 4 mEq/L (ref 3.5–5.1)
Sodium: 136 mEq/L (ref 135–145)
Total Bilirubin: 1 mg/dL (ref 0.3–1.2)

## 2012-11-07 LAB — URINALYSIS, ROUTINE W REFLEX MICROSCOPIC
Hgb urine dipstick: NEGATIVE
Protein, ur: 100 mg/dL — AB
Urobilinogen, UA: 1 mg/dL (ref 0.0–1.0)

## 2012-11-07 LAB — CREATININE, SERUM
GFR calc Af Amer: 58 mL/min — ABNORMAL LOW (ref >90)
GFR calc non Af Amer: 50 mL/min — ABNORMAL LOW (ref >90)

## 2012-11-07 LAB — LIPASE, BLOOD: Lipase: 31 U/L (ref 11–59)

## 2012-11-07 LAB — CBC WITH DIFFERENTIAL/PLATELET
Eosinophils Relative: 0 % (ref 0–5)
HCT: 41.1 % (ref 39.0–52.0)
Hemoglobin: 14.4 g/dL (ref 13.0–17.0)
Hemoglobin: 14 g/dL (ref 13.0–17.0)
Lymphocytes Relative: 10 % — ABNORMAL LOW (ref 12–46)
Lymphs Abs: 1.1 10*3/uL (ref 0.7–4.0)
Lymphs Abs: 1.1 10*3/uL (ref 0.7–4.0)
MCH: 31.6 pg (ref 26.0–34.0)
MCV: 90.7 fL (ref 78.0–100.0)
Monocytes Absolute: 1.2 10*3/uL — ABNORMAL HIGH (ref 0.1–1.0)
Monocytes Relative: 11 % (ref 3–12)
Monocytes Relative: 11 % (ref 3–12)
Neutro Abs: 8.8 10*3/uL — ABNORMAL HIGH (ref 1.7–7.7)
Neutrophils Relative %: 79 % — ABNORMAL HIGH (ref 43–77)
RBC: 4.53 MIL/uL (ref 4.22–5.81)
RBC: 4.43 MIL/uL (ref 4.22–5.81)
WBC: 10.9 10*3/uL — ABNORMAL HIGH (ref 4.0–10.5)

## 2012-11-07 LAB — CBC
MCHC: 34.7 g/dL (ref 30.0–36.0)
Platelets: 105 10*3/uL — ABNORMAL LOW (ref 150–400)
RDW: 13.3 % (ref 11.5–15.5)
WBC: 9.6 10*3/uL (ref 4.0–10.5)

## 2012-11-07 LAB — URINE MICROSCOPIC-ADD ON

## 2012-11-07 LAB — CG4 I-STAT (LACTIC ACID): Lactic Acid, Venous: 1.72 mmol/L (ref 0.5–2.2)

## 2012-11-07 MED ORDER — IPRATROPIUM BROMIDE 0.02 % IN SOLN
0.5000 mg | RESPIRATORY_TRACT | Status: DC
  Administered 2012-11-07: 0.5 mg via RESPIRATORY_TRACT
  Filled 2012-11-07: qty 2.5

## 2012-11-07 MED ORDER — PREDNISONE 1 MG PO TABS
2.0000 mg | ORAL_TABLET | Freq: Every day | ORAL | Status: DC
  Administered 2012-11-08 (×2): 2 mg via ORAL
  Filled 2012-11-07 (×3): qty 2

## 2012-11-07 MED ORDER — LORAZEPAM 2 MG/ML IJ SOLN: 1.0000 mg | Freq: Once | INTRAMUSCULAR | Status: DC

## 2012-11-07 MED ORDER — PREDNISONE 5 MG PO TABS
5.0000 mg | ORAL_TABLET | Freq: Every day | ORAL | Status: DC
  Filled 2012-11-07: qty 1

## 2012-11-07 MED ORDER — NITROGLYCERIN 0.4 MG SL SUBL: 0.4000 mg | SUBLINGUAL_TABLET | SUBLINGUAL | Status: DC | PRN

## 2012-11-07 MED ORDER — FENTANYL CITRATE 0.05 MG/ML IJ SOLN
50.0000 ug | Freq: Once | INTRAMUSCULAR | Status: AC
  Administered 2012-11-07: 50 ug via INTRAVENOUS
  Filled 2012-11-07 (×2): qty 2

## 2012-11-07 MED ORDER — ACYCLOVIR 200 MG PO CAPS
400.0000 mg | ORAL_CAPSULE | Freq: Every day | ORAL | Status: DC
  Administered 2012-11-07: 400 mg via ORAL
  Filled 2012-11-07 (×2): qty 2

## 2012-11-07 MED ORDER — FAMOTIDINE 20 MG PO TABS
20.0000 mg | ORAL_TABLET | Freq: Two times a day (BID) | ORAL | Status: DC
  Administered 2012-11-07 – 2012-11-11 (×8): 20 mg via ORAL
  Filled 2012-11-07 (×9): qty 1

## 2012-11-07 MED ORDER — CYANOCOBALAMIN 500 MCG PO TABS
500.0000 ug | ORAL_TABLET | Freq: Every day | ORAL | Status: DC
  Administered 2012-11-07 – 2012-11-11 (×5): 500 ug via ORAL
  Filled 2012-11-07 (×5): qty 1

## 2012-11-07 MED ORDER — FOLIC ACID 1 MG PO TABS
1.0000 mg | ORAL_TABLET | Freq: Every day | ORAL | Status: DC
  Administered 2012-11-08 – 2012-11-11 (×4): 1 mg via ORAL
  Filled 2012-11-07 (×4): qty 1

## 2012-11-07 MED ORDER — ASPIRIN EC 81 MG PO TBEC
81.0000 mg | DELAYED_RELEASE_TABLET | Freq: Every day | ORAL | Status: DC
  Administered 2012-11-08 – 2012-11-11 (×4): 81 mg via ORAL
  Filled 2012-11-07 (×4): qty 1

## 2012-11-07 MED ORDER — SODIUM CHLORIDE 0.9 % IV BOLUS (SEPSIS)
1000.0000 mL | Freq: Once | INTRAVENOUS | Status: AC
  Administered 2012-11-07: 1000 mL via INTRAVENOUS

## 2012-11-07 MED ORDER — CO-ENZYME Q-10 100 MG PO CAPS: 300.0000 mg | ORAL_CAPSULE | Freq: Every day | ORAL | Status: DC

## 2012-11-07 MED ORDER — ALBUTEROL SULFATE (5 MG/ML) 0.5% IN NEBU
2.5000 mg | INHALATION_SOLUTION | RESPIRATORY_TRACT | Status: DC
  Administered 2012-11-07: 2.5 mg via RESPIRATORY_TRACT
  Filled 2012-11-07: qty 0.5

## 2012-11-07 MED ORDER — LEVOFLOXACIN IN D5W 750 MG/150ML IV SOLN
750.0000 mg | INTRAVENOUS | Status: DC
  Administered 2012-11-09: 750 mg via INTRAVENOUS
  Filled 2012-11-07 (×2): qty 150

## 2012-11-07 MED ORDER — ACYCLOVIR 400 MG PO TABS
400.0000 mg | ORAL_TABLET | Freq: Every day | ORAL | Status: DC
  Filled 2012-11-07: qty 1

## 2012-11-07 MED ORDER — FLUTICASONE PROPIONATE 50 MCG/ACT NA SUSP
2.0000 | Freq: Every day | NASAL | Status: DC
  Administered 2012-11-07 – 2012-11-11 (×4): 2 via NASAL
  Filled 2012-11-07: qty 16

## 2012-11-07 MED ORDER — TAMSULOSIN HCL 0.4 MG PO CAPS
0.4000 mg | ORAL_CAPSULE | Freq: Every day | ORAL | Status: DC
  Administered 2012-11-07 – 2012-11-11 (×5): 0.4 mg via ORAL
  Filled 2012-11-07 (×5): qty 1

## 2012-11-07 MED ORDER — PANTOPRAZOLE SODIUM 40 MG PO TBEC
40.0000 mg | DELAYED_RELEASE_TABLET | Freq: Every day | ORAL | Status: DC
  Administered 2012-11-07 – 2012-11-10 (×4): 40 mg via ORAL
  Filled 2012-11-07 (×3): qty 1

## 2012-11-07 MED ORDER — TRAMADOL HCL 50 MG PO TABS
50.0000 mg | ORAL_TABLET | Freq: Three times a day (TID) | ORAL | Status: DC | PRN
  Administered 2012-11-07: 50 mg via ORAL
  Filled 2012-11-07: qty 1

## 2012-11-07 MED ORDER — LEVOFLOXACIN IN D5W 750 MG/150ML IV SOLN: 750.0000 mg | INTRAVENOUS | Status: DC

## 2012-11-07 MED ORDER — LEVOFLOXACIN IN D5W 750 MG/150ML IV SOLN
750.0000 mg | INTRAVENOUS | Status: DC
  Administered 2012-11-07: 750 mg via INTRAVENOUS
  Filled 2012-11-07: qty 150

## 2012-11-07 MED ORDER — METOPROLOL SUCCINATE ER 25 MG PO TB24
25.0000 mg | ORAL_TABLET | Freq: Every day | ORAL | Status: DC
  Administered 2012-11-08 – 2012-11-11 (×4): 25 mg via ORAL
  Filled 2012-11-07 (×4): qty 1

## 2012-11-07 MED ORDER — ENOXAPARIN SODIUM 40 MG/0.4ML ~~LOC~~ SOLN
40.0000 mg | SUBCUTANEOUS | Status: DC
  Administered 2012-11-07 – 2012-11-09 (×3): 40 mg via SUBCUTANEOUS
  Filled 2012-11-07 (×4): qty 0.4

## 2012-11-07 NOTE — ED Notes (Signed)
Patient transported to X-ray 

## 2012-11-07 NOTE — Progress Notes (Signed)
Patient stated that he takes 2 mg prednisone.  MD notified via text.

## 2012-11-07 NOTE — ED Notes (Signed)
MD at bedside. 

## 2012-11-07 NOTE — ED Notes (Signed)
Pt. returned from XR. 

## 2012-11-07 NOTE — Telephone Encounter (Signed)
Wife/Martha calling about patient.  Says she just spoke with nurse who told her to take her husband to the ER due to fever, generalized aching, feeling very weak.  Is waiting for Dr Harriette Bouillon' office to call with surgical procedure arrangements due to blockage in head.  Husband confused and dizzy at times.  Did take Acyclovir tab since he thinks Sx also may be related to the shingles he had 10/05/2012. Plans to call Dr Harriette Bouillon' office now, if unable to reach them or not otherwise directed, will take husband to ER - recommended [facility] per office guidelines.

## 2012-11-07 NOTE — Progress Notes (Signed)
ANTIBIOTIC CONSULT NOTE - INITIAL  Pharmacy Consult: Renal antibiotic adjustment  Assessment: 89yom started on Levaquin for r/o PNA. Patient received first dose today ~1900 - SCr 1.33 mg/dl - CrCl ~16 ml/min  Plan:  1. Change Levaquin to 750mg  IV q48h - renally adjusted for CrCl 20-49 ml/min 2. Monitor renal function, cultures and clinical course  [name] [phone] 11/07/2012,9:30 PM

## 2012-11-07 NOTE — Telephone Encounter (Signed)
FYI.  Please review for your own information.

## 2012-11-07 NOTE — H&P (Signed)
[facility] History and Physical  BRISTOL SOY ZOX:[id number] DOB: Dec 14, 1922 DOA: 11/07/2012  Referring physician: Dr Lanora Manis PCP: Rogelia Boga, MD  Specialists: Dr Corliss Skains Chief Complaint: sob and right sided chest pain on deep breathing.   HPI: [name] is a 79 y.o. male with multiple medical problems including TIA, recently diagnosed shingles, hypertension, came in today for sob and fever . He also reports cough and right sided chest pain worsens with cough. Fever of 101 at home and febrile in ED, was found to have pneumonia on CXR. He was referred to medical service for admission for management of cap. 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   Review of Systems: The patient denies anorexia,  weight loss,, vision loss,  hoarseness, chest pain, syncope,  peripheral edema, balance deficits, hemoptysis, melena, hematochezia, severe indigestion/heartburn, hematuria, incontinence, genital sores,  suspicious skin lesions, transient blindness, difficulty walking, depression, unusual weight change, abnormal bleeding, enlarged lymph nodes, angioedema, and breast masses.    Past Medical History  Diagnosis Date  . IBS (irritable bowel syndrome)   . Hemorrhoids   . Diverticulosis   . GERD (gastroesophageal reflux disease)   . Tubular adenoma of colon 07/1998  . Anxiety and depression   . Elevated prostate specific antigen (PSA)   . Vitamin B12 deficiency   . UTI (lower urinary tract infection)   . Arthritis   . Sciatica   . Anemia   . TIA (transient ischemic attack)   . Peptic ulcer disease   . Hyperlipidemia   . CAD (coronary artery disease)     PCI 2006, Stress Perfusion Study 2011  . Hypertension   . Rheumatoid arthritis(714.0)   . Polymyalgia    Past Surgical History  Procedure Laterality Date  . Ptca  2006    Stenting of proximal LAD w/ 75 % stenosis  reduced to 0%  . Cataract extraction    . Cholecystectomy  2001  . Hernia repair    . Hip fracture surgery  03/2007    Right  . Salivary stone removal  11/2007   Social History:  reports that he quit smoking about 40 years ago. His smoking use included Cigarettes. He smoked 0.00 packs per day. He has never used smokeless tobacco. He reports that he does not drink alcohol or use illicit drugs. where does patient live--home,   Allergies  Allergen Reactions  . Amoxicillin     REACTION: unspecified  . Doxycycline   . Erythromycin     REACTION: unspecified  . Penicillins     rash  . Sulfamethoxazole W-Trimethoprim     unknown    Family History  Problem Relation Age of Onset  . Colon cancer Maternal Uncle 50    ish  . Coronary artery disease Father      Prior to Admission medications   Medication Sig Start Date End Date Taking? Authorizing Provider  acyclovir (ZOVIRAX) 400 MG tablet Take 400 mg by mouth daily.  10/05/12  Yes Historical Provider, MD  aspirin 81 MG tablet Take 81 mg by mouth daily.   Yes Historical Provider, MD  Co-Enzyme Q-10 100 MG CAPS Take 300 mg by mouth daily.   Yes Historical Provider, MD  cyanocobalamin 500 MCG tablet Take 500 mcg by mouth daily.   Yes Historical Provider, MD  desonide (DESOWEN) 0.05 % ointment Apply 1 application topically as needed.   Yes Historical Provider, MD  Dextromethorphan-Guaifenesin (MUCINEX DM PO) Take 1 capsule by mouth daily.   Yes Historical Provider, MD  fluticasone (FLONASE) 50 MCG/ACT nasal spray Place 2 sprays into the nose daily. 06/23/12  Yes Gordy Savers, MD  FOLIC ACID PO Take 1 tablet by mouth daily.   Yes Historical Provider, MD  metoprolol succinate (TOPROL-XL) 25 MG 24 hr tablet Take 1 tablet (25 mg total) by mouth daily. 04/10/12 04/10/13 Yes Rollene Rotunda, MD  Multiple Vitamins-Minerals (PRESERVISION/LUTEIN PO) Take 1 tablet by mouth 2 (two) times daily.   Yes Historical Provider, MD  NEXIUM 40 MG capsule Take  40 mg by mouth daily before breakfast.  08/22/12  Yes Historical Provider, MD  nitroGLYCERIN (NITROSTAT) 0.4 MG SL tablet Place 1 tablet (0.4 mg total) under the tongue every 5 (five) minutes as needed. 10/31/12  Yes Rollene Rotunda, MD  predniSONE (DELTASONE) 5 MG tablet Take 5 mg by mouth daily.   Yes Historical Provider, MD  Ranibizumab (LUCENTIS IO) Inject 1 each into the eye every 6 (six) weeks.   Yes Historical Provider, MD  ranitidine (ZANTAC) 150 MG tablet Take 1 tablet (150 mg total) by mouth at bedtime. 11/02/12  Yes Ethelda Chick, MD  tamsulosin (FLOMAX) 0.4 MG CAPS capsule Take 1 capsule (0.4 mg total) by mouth daily. 11/02/12  Yes Ethelda Chick, MD  traMADol (ULTRAM) 50 MG tablet Take 1 tablet (50 mg total) by mouth every 8 (eight) hours as needed for pain. 10/13/12  Yes Gordy Savers, MD  Triamcinolone Acetonide (TRIAMCINOLONE 0.1 % CREAM : EUCERIN) CREA Apply 1 application topically as needed.   Yes Historical Provider, MD   Physical Exam: Filed Vitals:   11/07/12 2110  BP: 130/59  Pulse: 89  Temp: 98.3 F (36.8 C)  Resp: 18    Constitutional: Vital signs reviewed.  Patient is a well-developed and well-nourished  in no acute distress and cooperative with exam. Alert and oriented x3.  Head: Normocephalic and atraumatic Mouth: no erythema or exudates, MMM Eyes: PERRL, EOMI, conjunctivae normal, No scleral icterus.  Neck: Supple, Trachea midline normal ROM, No JVD, mass, thyromegaly, or carotid bruit present.  Cardiovascular: RRR, S1 normal, S2 normal, no MRG, pulses symmetric and intact bilaterally Pulmonary/Chest: normal respiratory effort,  Scattered rales.  Abdominal: Soft. Non-tender, non-distended, bowel sounds are normal, no masses, organomegaly, or guarding present.  Musculoskeletal: No joint deformities, erythema, or stiffness, ROM full and no nontender Neurological: A&O x3, Strength is normal and symmetric bilaterally, cranial nerve II-XII are grossly intact, no focal  motor deficit, sensory intact to light touch bilaterally.  Skin: Warm, dry and intact. No rash, cyanosis, or clubbing.      Labs on Admission:  Basic Metabolic Panel:  Recent Labs Lab 11/02/12 1924 11/07/12 1432 11/07/12 1655  NA 144 136 136  K 4.4 4.1 4.0  CL 108 99 99  CO2 28 29 29   GLUCOSE 103* 122* 105*  BUN 34* 20 20  CREATININE 1.85* 1.33 1.33  CALCIUM 9.7 9.4 9.2   Liver Function Tests:  Recent Labs Lab 11/02/12 1924 11/07/12 1432 11/07/12 1655  AST 20 15 14   ALT 16 14 12   ALKPHOS 68 69 70  BILITOT 0.6 1.0 1.0  PROT 6.7 7.0 6.9  ALBUMIN 4.0 3.3* 3.3*    Recent Labs Lab 11/07/12 1655  LIPASE 31   No results found for this basename: AMMONIA,  in the last 168 hours CBC:  Recent Labs Lab 11/02/12 1937 11/07/12 1432 11/07/12 1655  WBC 8.2  10.9* 11.1*  NEUTROABS  --  8.5* 8.8*  HGB 14.5 14.4 14.0  HCT 45.6 41.1 40.0  MCV 96.3 90.7 90.3  PLT  --  124* 116*   Cardiac Enzymes: No results found for this basename: CKTOTAL, CKMB, CKMBINDEX, TROPONINI,  in the last 168 hours  BNP (last 3 results) No results found for this basename: PROBNP,  in the last 8760 hours CBG: No results found for this basename: GLUCAP,  in the last 168 hours  Radiological Exams on Admission: Dg Chest 2 View  11/07/2012   *RADIOLOGY REPORT*  Clinical Data: Fever.  Dizziness.  Back pain.  CHEST - 2 VIEW  Comparison: 11/02/2012 the  Findings: Mild streaky opacity is seen in the posterior right lower lobe, which is new and suspicious for pneumonia. The left lung remains clear.  No evidence of pleural effusion.  Heart size is within normal limits.  No mass or lymphadenopathy identified.  IMPRESSION: New mild posterior right lower lobe opacity, suspicious for pneumonia. Post-treatment  radiographic followup recommended to confirm resolution.   Original Report Authenticated By: Myles Rosenthal, M.D.    EKG: pending.   Assessment/Plan Active Problems:  1. CAP:  - admit to  telemetry - started pt on levaquin.  - nebs as needed.  - blood cultures, sputum cultures, ordered.    2. Shingles: - resume acyclovir.   3. Lateral sinus thrombosis: - please call Dr. Corliss Skains in am for consult.   4. Hypertension: controlled.   5. Thrombocytopenia: mild.  Continue to monitor.   6. Mild leukocytosis: possibly from pneumonia.   DVT Prophylaxis    Code Status:  Full code Family Communication: wife at bedside Disposition Plan: pending.   Time spent: 65 minutes  Aolani Piggott [facility] Pager [phone]  If 7PM-7AM, please contact night-coverage [email or web address] Password [facility] 11/07/2012, 9:32 PM

## 2012-11-07 NOTE — Telephone Encounter (Signed)
Patient Information:  Caller Name: [name]  Phone: [phone]  Patient: [name], [name]  Gender: Male  DOB: Apr 08, 1922  Age: 79 Years  PCP: Eleonore Chiquito [facility])  Office Follow Up:  Does the office need to follow up with this patient?: No  Instructions For The Office: N/A  RN Note:  Wanted to be seen in the office but sent to ED d/t chest pain, fever and weakness. Please alert Dr.K that MRI done and surgery needed. Waiting on call from Dr. Laveda Abbe of when it is scheduled.  Symptoms  Reason For Call & Symptoms: Seen by specialist, Dr. Laveda Abbe and MRI of head last week. Called on 11/06/12 and told he has blockage in vein and needs surgery.. Today temp = 101.2 Axillary and he has body aches and pain on R side of upper back with breathing and pain on R upper abdomen. He feels weak.  Reviewed Health History In EMR: Yes  Reviewed Medications In EMR: Yes  Reviewed Allergies In EMR: Yes  Reviewed Surgeries / Procedures: Yes  Date of Onset of Symptoms: 11/07/2012  Any Fever: Yes  Fever Taken: Axillary  Fever Time Of Reading: 12:00:00  Fever Last Reading: 101.2  Guideline(s) Used:  Back Pain  Chest Pain  Disposition Per Guideline:   Go to ED Now  Reason For Disposition Reached:   Difficulty breathing  Advice Given:  Chest Pain Only When Coughing:  Chest pains that occur with coughing generally come from the chest wall and from irritation of the airways. They are usually not serious.  Call Back If:  Severe chest pain  Difficulty breathing  Fever  You become worse.  Patient Will Follow Care Advice:  YES

## 2012-11-07 NOTE — Progress Notes (Signed)
Patient arrived on from ED, BP 130/59, HR 89, R 18. T 98.3.

## 2012-11-07 NOTE — ED Notes (Signed)
Pt here c/o fever and pain on right side; pt sts ear issues x 2 years and then diagnosed with shingles and treated with acyclovir; pt sts stopped meds yesterday due to not helping and having increased pain today; pt noted to have fever at present

## 2012-11-07 NOTE — ED Provider Notes (Signed)
CSN: [id number]     Arrival date & time 11/07/12  1412 History     First MD Initiated Contact with Patient 11/07/12 1611     Chief Complaint  Patient presents with  . Fever  . Back Pain   (Consider location/radiation/quality/duration/timing/severity/associated sxs/prior Treatment) HPI 79 year old male with history of CAD status post PCI in 2006, hypertension, hyperlipidemia presents with fever, shortness of breath and right-sided chest and back pain.  Patient reports fever, chills, and right-sided pain began today. Pain is rated 4/10. Pain is sharp and worse with deep breaths, located under right scapula. Denies associated nausea, vomiting. He notes some right upper quadrant and right lower chest pain as well.  Notes fevers and chills started today and at home he had a temperature up to 101.  Reports 6 weeks ago he was diagnosed with shingles by his ophthalmologist by a blood test and had been taking acyclovir, however does not like the way acyclovir makes him feel and he and wife have multiple questions about shingles. Denies hospital hospitalization last month, the patient resides at home. Denies history of DVT, PE or family history DVT/PE, asymmetric leg swelling, hemoptysis, cough, known personal history of cancer.  Pt also with recent intracranial abnormality on mri.      Past Medical History  Diagnosis Date  . IBS (irritable bowel syndrome)   . Hemorrhoids   . Diverticulosis   . GERD (gastroesophageal reflux disease)   . Tubular adenoma of colon 07/1998  . Anxiety and depression   . Elevated prostate specific antigen (PSA)   . Vitamin B12 deficiency   . UTI (lower urinary tract infection)   . Arthritis   . Sciatica   . Anemia   . TIA (transient ischemic attack)   . Peptic ulcer disease   . Hyperlipidemia   . CAD (coronary artery disease)     PCI 2006, Stress Perfusion Study 2011  . Hypertension   . Rheumatoid arthritis(714.0)   . Polymyalgia    Past Surgical History   Procedure Laterality Date  . Ptca  2006    Stenting of proximal LAD w/ 75 % stenosis reduced to 0%  . Cataract extraction    . Cholecystectomy  2001  . Hernia repair    . Hip fracture surgery  03/2007    Right  . Salivary stone removal  11/2007   Family History  Problem Relation Age of Onset  . Colon cancer Maternal Uncle 50    ish  . Coronary artery disease Father    History  Substance Use Topics  . Smoking status: Former Smoker    Types: Cigarettes    Quit date: 03/29/1972  . Smokeless tobacco: Never Used  . Alcohol Use: No     Comment: occasional    Review of Systems  Constitutional: Positive for fever, chills, diaphoresis and fatigue.  HENT: Negative for sore throat and neck stiffness.   Eyes: Negative for visual disturbance.  Respiratory: Positive for shortness of breath. Negative for cough.   Cardiovascular: Positive for chest pain (right sided). Negative for leg swelling.  Gastrointestinal: Positive for abdominal pain (RUQ pain) and constipation. Negative for nausea, vomiting and diarrhea.  Genitourinary: Negative for dysuria and difficulty urinating.  Musculoskeletal: Positive for back pain.  Skin: Negative for rash.  Neurological: Positive for headaches (6wk, no trauma). Negative for syncope.    Allergies  Amoxicillin; Doxycycline; Erythromycin; Penicillins; and Sulfamethoxazole w-trimethoprim  Home Medications   No current outpatient prescriptions on file. BP 102/49  Pulse  91  Temp(Src) 98.2 F (36.8 C) (Oral)  Resp 20  Ht 5\' 10"  (1.778 m)  Wt 171 lb 12.8 oz (77.928 kg)  BMI 24.65 kg/m2  SpO2 99% Physical Exam  Nursing note and vitals reviewed. Constitutional: He is oriented to person, place, and time. He appears well-developed and well-nourished. No distress.  HENT:  Head: Normocephalic and atraumatic.  Right Ear: External ear normal. Tympanic membrane is not injected.  Left Ear: External ear normal. Tympanic membrane is not injected.   Mouth/Throat: Oropharynx is clear and moist. No oropharyngeal exudate.  Eyes: Conjunctivae and EOM are normal.  Neck: Normal range of motion.  Cardiovascular: Normal rate, regular rhythm and intact distal pulses.  Exam reveals no gallop and no friction rub.   Murmur heard. Carotid bruit on right  Pulmonary/Chest: Effort normal and breath sounds normal. No respiratory distress. He has no wheezes. He has no rales. He exhibits no tenderness.  Abdominal: Soft. He exhibits no distension. There is tenderness (mild LLQ). There is no guarding.  Musculoskeletal: He exhibits no edema and no tenderness.  Neurological: He is alert and oriented to person, place, and time.  Skin: Skin is warm and dry. He is not diaphoretic.    ED Course   Procedures (including critical care time)  Labs Reviewed  CBC WITH DIFFERENTIAL - Abnormal; Notable for the following:    WBC 10.9 (*)    Platelets 124 (*)    Neutrophils Relative % 79 (*)    Neutro Abs 8.5 (*)    Lymphocytes Relative 10 (*)    Monocytes Absolute 1.2 (*)    All other components within normal limits  COMPREHENSIVE METABOLIC PANEL - Abnormal; Notable for the following:    Glucose, Bld 122 (*)    Albumin 3.3 (*)    GFR calc non Af Amer 46 (*)    GFR calc Af Amer 53 (*)    All other components within normal limits  URINALYSIS, ROUTINE W REFLEX MICROSCOPIC - Abnormal; Notable for the following:    Color, Urine AMBER (*)    APPearance HAZY (*)    Bilirubin Urine SMALL (*)    Ketones, ur 15 (*)    Protein, ur 100 (*)    Leukocytes, UA TRACE (*)    All other components within normal limits  CBC WITH DIFFERENTIAL - Abnormal; Notable for the following:    WBC 11.1 (*)    Platelets 116 (*)    Neutrophils Relative % 79 (*)    Neutro Abs 8.8 (*)    Lymphocytes Relative 10 (*)    Monocytes Absolute 1.3 (*)    All other components within normal limits  COMPREHENSIVE METABOLIC PANEL - Abnormal; Notable for the following:    Glucose, Bld 105 (*)     Albumin 3.3 (*)    GFR calc non Af Amer 46 (*)    GFR calc Af Amer 53 (*)    All other components within normal limits  D-DIMER, QUANTITATIVE - Abnormal; Notable for the following:    D-Dimer, Quant 2.44 (*)    All other components within normal limits  URINE MICROSCOPIC-ADD ON - Abnormal; Notable for the following:    Squamous Epithelial / LPF FEW (*)    Bacteria, UA FEW (*)    All other components within normal limits  CBC - Abnormal; Notable for the following:    RBC 4.07 (*)    Hemoglobin 12.8 (*)    HCT 36.9 (*)    Platelets 105 (*)  All other components within normal limits  CREATININE, SERUM - Abnormal; Notable for the following:    GFR calc non Af Amer 50 (*)    GFR calc Af Amer 58 (*)    All other components within normal limits  CBC - Abnormal; Notable for the following:    WBC 11.4 (*)    Platelets 119 (*)    All other components within normal limits  BASIC METABOLIC PANEL - Abnormal; Notable for the following:    Glucose, Bld 111 (*)    GFR calc non Af Amer 47 (*)    GFR calc Af Amer 54 (*)    All other components within normal limits  SEDIMENTATION RATE - Abnormal; Notable for the following:    Sed Rate 60 (*)    All other components within normal limits  URINE CULTURE  CULTURE, BLOOD (ROUTINE X 2)  CULTURE, BLOOD (ROUTINE X 2)  CULTURE, EXPECTORATED SPUTUM-ASSESSMENT  GRAM STAIN  CULTURE, BLOOD (ROUTINE X 2)  LIPASE, BLOOD  LACTIC ACID, PLASMA  HIV ANTIBODY (ROUTINE TESTING)  STREP PNEUMONIAE URINARY ANTIGEN  LEGIONELLA ANTIGEN, URINE  C-REACTIVE PROTEIN  TSH  T4, FREE  CG4 I-STAT (LACTIC ACID)   Dg Chest 2 View  11/07/2012   *RADIOLOGY REPORT*  Clinical Data: Fever.  Dizziness.  Back pain.  CHEST - 2 VIEW  Comparison: 11/02/2012 the  Findings: Mild streaky opacity is seen in the posterior right lower lobe, which is new and suspicious for pneumonia. The left lung remains clear.  No evidence of pleural effusion.  Heart size is within normal limits.   No mass or lymphadenopathy identified.  IMPRESSION: New mild posterior right lower lobe opacity, suspicious for pneumonia. Post-treatment  radiographic followup recommended to confirm resolution.   Original Report Authenticated By: Myles Rosenthal, M.D.   1. CAP (community acquired pneumonia)   2. Anemia, unspecified   3. Shingles   4. Memory loss   5. Peptic ulcer, unspecified site, unspecified as acute or chronic, without mention of hemorrhage, perforation, or obstruction   6. Polymyalgia rheumatica   7. Thrombosis of lateral venous sinus     Date: 11/07/2012  Rate: 82  Rhythm: normal sinus rhythm  QRS Axis: normal  Intervals: normal  ST/T Wave abnormalities: normal  Conduction Disutrbances:none  Narrative Interpretation:   Old EKG Reviewed: unchanged   MDM  79 year old male with history of CAD status post PCI in 2006, hypertension, hyperlipidemia presents with fever, shortness of breath and right-sided chest and back pain.  Patient arrived in the emergency department with a fever of 100.7, and tachycardia the low 100s, with normal oxygenation on room air and normal blood pressure. Patient with fever, fatigue, right-sided pain and shortness of rash with mild leukocytosis and chest x-ray shows right-sided pneumonia.  D-dimer initially ordered given pleuritic pain, however given the presence of pneumonia on chest x-ray, no known risk factors for DVT other than age, no leg swelling, no tachypnea, resolved tachycardia, no hypoxia feels symptoms are more consistent with pneumonia than PE.  Urinalysis done when source of fever was unclear and patient complaining of right-sided flank pain and showed no sign of UTI.  Patient given 2 bolus NS and Levaquin for community acquired pneumonia.  Pt admitted to hospitalist service in stable condition.    Rhae Lerner, MD 11/08/12 1753

## 2012-11-07 NOTE — Progress Notes (Signed)
PHARMACIST - PHYSICIAN ORDER COMMUNICATION  CONCERNING: P&T Medication Policy on Herbal Medications  DESCRIPTION:  This patient's order for:  CoQ-10  has been noted.  This product(s) is classified as an "herbal" or natural product. Due to a lack of definitive safety studies or FDA approval, nonstandard manufacturing practices, plus the potential risk of unknown drug-drug interactions while on inpatient medications, the Pharmacy and Therapeutics Committee does not permit the use of "herbal" or natural products of this type within [facility].   ACTION TAKEN: The pharmacy department is unable to verify this order at this time. Please reevaluate patient's clinical condition at discharge and address if the herbal or natural product(s) should be resumed at that time.  Claudett Bayly, PharmD., MS Clinical Pharmacist Pager:  [phone] Thank you for allowing pharmacy to be part of this patients care team. 

## 2012-11-08 ENCOUNTER — Telehealth (HOSPITAL_COMMUNITY): Payer: Self-pay | Admitting: Interventional Radiology

## 2012-11-08 ENCOUNTER — Encounter (HOSPITAL_COMMUNITY): Payer: Self-pay | Admitting: General Practice

## 2012-11-08 DIAGNOSIS — M353 Polymyalgia rheumatica: Secondary | ICD-10-CM | POA: Diagnosis not present

## 2012-11-08 DIAGNOSIS — K279 Peptic ulcer, site unspecified, unspecified as acute or chronic, without hemorrhage or perforation: Secondary | ICD-10-CM | POA: Diagnosis not present

## 2012-11-08 DIAGNOSIS — R413 Other amnesia: Secondary | ICD-10-CM

## 2012-11-08 DIAGNOSIS — J189 Pneumonia, unspecified organism: Secondary | ICD-10-CM | POA: Diagnosis not present

## 2012-11-08 DIAGNOSIS — G08 Intracranial and intraspinal phlebitis and thrombophlebitis: Secondary | ICD-10-CM

## 2012-11-08 LAB — URINE CULTURE

## 2012-11-08 LAB — CBC
MCH: 31.6 pg (ref 26.0–34.0)
MCHC: 34.7 g/dL (ref 30.0–36.0)
Platelets: 119 10*3/uL — ABNORMAL LOW (ref 150–400)
RBC: 4.33 MIL/uL (ref 4.22–5.81)
RDW: 13.3 % (ref 11.5–15.5)

## 2012-11-08 LAB — BASIC METABOLIC PANEL
Calcium: 9.1 mg/dL (ref 8.4–10.5)
GFR calc non Af Amer: 47 mL/min — ABNORMAL LOW (ref >90)
Sodium: 137 mEq/L (ref 135–145)

## 2012-11-08 LAB — STREP PNEUMONIAE URINARY ANTIGEN: Strep Pneumo Urinary Antigen: NEGATIVE

## 2012-11-08 LAB — T4, FREE: Free T4: 0.93 ng/dL (ref 0.80–1.80)

## 2012-11-08 LAB — TSH: TSH: 1.998 u[IU]/mL (ref 0.350–4.500)

## 2012-11-08 MED ORDER — PREDNISONE 5 MG PO TABS
5.0000 mg | ORAL_TABLET | Freq: Every day | ORAL | Status: DC
  Administered 2012-11-09 – 2012-11-11 (×3): 5 mg via ORAL
  Filled 2012-11-08 (×5): qty 1

## 2012-11-08 MED ORDER — ALBUTEROL SULFATE (5 MG/ML) 0.5% IN NEBU
2.5000 mg | INHALATION_SOLUTION | RESPIRATORY_TRACT | Status: DC | PRN
  Filled 2012-11-08: qty 0.5

## 2012-11-08 NOTE — Telephone Encounter (Signed)
Spoke with pt and told her that her MRI was again denied and MRA was approved. This was the same decision that the insurance company made on our previous attempts to have these procedures approved. Pt states that she will be speaking with Marcelyn Bruins today to try and pursue this issue further. She also states that has been in contact with her insurance company on multiple occasions. She will call me tomorrow (11/09/12) to let me know what Marcelyn Bruins tells her. JMichaux

## 2012-11-08 NOTE — Consult Note (Signed)
NEURO HOSPITALIST CONSULT NOTE    Reason for Consult: right pulsatile tinnitus, HA, imbalance, abnormal MRV.  HPI:                                                                                                                                          [name] is an 79 y.o. male with a past medical history significant for HTN, CAD s/p PCI 2006,  TIA, RA, vitamin B 12 deficiency, GERD, IBS, admitted to [facility] with pneumonia. A neurological consultation was requested for further evaluation of recent neuro-imaging studies in the context of chronic pulsatile right tinnitus, right ear pain, vertigo x 1, and HA. He indicated that he has been experiencing a pulsatile noise in the right ear for the last couple of months, as well as chronic right ear pain. Headache occurs frequently but it is mildly intense and not associated with vomiting or focal weakness/numbness. Reports double vision that is corrected by using what he calls " special lenses'.  His wife reports progressive cognitive decline with significant mood changes. MRI brain 8/8 revealed no acute stroke but a tangle of vessels in the region of the left transverse sinus, sigmoid sinus and jugular bulb suggestive of a dural fistula. MRV brain showed abnormal vessels in the region of the left sigmoid sinus, transverse sinus and jugular bulb suggestive of dural fistula. This is causing incomplete filling and narrowing of the left transverse sinus/sigmoid sinus and jugular bulb.     Past Medical History  Diagnosis Date  . IBS (irritable bowel syndrome)   . Hemorrhoids   . Diverticulosis   . GERD (gastroesophageal reflux disease)   . Tubular adenoma of colon 07/1998  . Anxiety and depression   . Elevated prostate specific antigen (PSA)   . Vitamin B12 deficiency   . UTI (lower urinary tract infection)   . Arthritis   . Sciatica   . Anemia   . TIA (transient ischemic attack)   . Peptic ulcer disease   . Hyperlipidemia    . CAD (coronary artery disease)     PCI 2006, Stress Perfusion Study 2011  . Hypertension   . Rheumatoid arthritis(714.0)   . Polymyalgia     Past Surgical History  Procedure Laterality Date  . Ptca  2006    Stenting of proximal LAD w/ 75 % stenosis reduced to 0%  . Cataract extraction    . Cholecystectomy  2001  . Hernia repair    . Hip fracture surgery  03/2007    Right  . Salivary stone removal  11/2007    Family History  Problem Relation Age of Onset  . Colon cancer Maternal Uncle 50    ish  . Coronary artery disease Father     Family History:  Social History:  reports that he quit smoking about 40 years ago. His smoking use included Cigarettes. He smoked 0.00 packs per day. He has never used smokeless tobacco. He reports that he does not drink alcohol or use illicit drugs.  Allergies  Allergen Reactions  . Amoxicillin     REACTION: unspecified  . Doxycycline   . Erythromycin     REACTION: unspecified  . Penicillins     rash  . Sulfamethoxazole W-Trimethoprim     unknown    MEDICATIONS:                                                                                                                     I have reviewed the patient's current medications.   ROS:                                                                                                                                       History obtained from the patient, wife, and chart review.  General ROS: negative for - chills, fatigue, fever, night sweats, weight gain or weight loss Psychological ROS: significant for memory difficulties, mood swings Ophthalmic ROS: negative for - blurry vision, double vision, eye pain or loss of vision ENT ROS: negative for - epistaxis, nasal discharge, oral lesions, sore throat, tinnitus or vertigo Allergy and Immunology ROS: negative for - hives or itchy/watery eyes Hematological and Lymphatic ROS: negative for - bleeding problems, bruising or swollen lymph  nodes Endocrine ROS: negative for - galactorrhea, hair pattern changes, polydipsia/polyuria or temperature intolerance Respiratory ROS: negative for - cough, hemoptysis, shortness of breath or wheezing Cardiovascular ROS: negative for - chest pain, dyspnea on exertion, edema or irregular heartbeat Gastrointestinal ROS: negative for - abdominal pain, diarrhea, hematemesis, nausea/vomiting or stool incontinence Genito-Urinary ROS: negative for - dysuria, hematuria, incontinence or urinary frequency/urgency Musculoskeletal ROS: negative for - joint swelling or muscular weakness Neurological ROS: as noted in HPI Dermatological ROS: negative for rash and skin lesion changes   Physical exam: pleasant male in no apparent distress. Head: normocephalic. Neck: supple, no bruits, no JVD. Cardiac: no murmurs. Lungs: clear. Abdomen: soft, no tender, no mass. Extremities: no edema.  Blood pressure 102/49, pulse 91, temperature 98.2 F (36.8 C), temperature source Oral, resp. rate 20, height 5\' 10"  (1.778 m), weight 77.928 kg (171 lb 12.8 oz), SpO2 99.00%.   Neurologic Examination:  Mental Status: Alert, disoriented to place and month, thought content appropriate.  Speech fluent without evidence of aphasia.  Able to follow 3 step commands without difficulty. Cranial Nerves: II: Discs flat bilaterally; Visual fields grossly normal, pupils equal, round, reactive to light and accommodation III,IV, VI: ptosis not present, extra-ocular motions intact bilaterally V,VII: smile symmetric, facial light touch sensation normal bilaterally VIII: hearing normal bilaterally IX,X: gag reflex present XI: bilateral shoulder shrug XII: midline tongue extension Motor: Right : Upper extremity   5/5    Left:     Upper extremity   5/5  Lower extremity   5/5     Lower extremity   5/5 Tone and bulk:normal tone  throughout; no atrophy noted Sensory: Pinprick and light touch intact throughout, bilaterally Deep Tendon Reflexes:  1+ all over  Plantars: Right: downgoing   Left: downgoing Cerebellar: normal finger-to-nose,  normal heel-to-shin test Gait:  No tested. CV: pulses palpable throughout    Lab Results  Component Value Date/Time   CHOL 137 05/28/2011 10:39 AM    Results for orders placed during the hospital encounter of 11/07/12 (from the past 48 hour(s))  CBC WITH DIFFERENTIAL     Status: Abnormal   Collection Time    11/07/12  2:32 PM      Result Value Range   WBC 10.9 (*) 4.0 - 10.5 K/uL   RBC 4.53  4.22 - 5.81 MIL/uL   Hemoglobin 14.4  13.0 - 17.0 g/dL   HCT 16.1  09.6 - 04.5 %   MCV 90.7  78.0 - 100.0 fL   MCH 31.8  26.0 - 34.0 pg   MCHC 35.0  30.0 - 36.0 g/dL   RDW 40.9  81.1 - 91.4 %   Platelets 124 (*) 150 - 400 K/uL   Neutrophils Relative % 79 (*) 43 - 77 %   Neutro Abs 8.5 (*) 1.7 - 7.7 K/uL   Lymphocytes Relative 10 (*) 12 - 46 %   Lymphs Abs 1.1  0.7 - 4.0 K/uL   Monocytes Relative 11  3 - 12 %   Monocytes Absolute 1.2 (*) 0.1 - 1.0 K/uL   Eosinophils Relative 0  0 - 5 %   Eosinophils Absolute 0.0  0.0 - 0.7 K/uL   Basophils Relative 0  0 - 1 %   Basophils Absolute 0.0  0.0 - 0.1 K/uL  COMPREHENSIVE METABOLIC PANEL     Status: Abnormal   Collection Time    11/07/12  2:32 PM      Result Value Range   Sodium 136  135 - 145 mEq/L   Potassium 4.1  3.5 - 5.1 mEq/L   Chloride 99  96 - 112 mEq/L   CO2 29  19 - 32 mEq/L   Glucose, Bld 122 (*) 70 - 99 mg/dL   BUN 20  6 - 23 mg/dL   Creatinine, Ser 7.82  0.50 - 1.35 mg/dL   Calcium 9.4  8.4 - 95.6 mg/dL   Total Protein 7.0  6.0 - 8.3 g/dL   Albumin 3.3 (*) 3.5 - 5.2 g/dL   AST 15  0 - 37 U/L   ALT 14  0 - 53 U/L   Alkaline Phosphatase 69  39 - 117 U/L   Total Bilirubin 1.0  0.3 - 1.2 mg/dL   GFR calc non Af Amer 46 (*) >90 mL/min   GFR calc Af Amer 53 (*) >90 mL/min   Comment:  The eGFR has been  calculated     using the CKD EPI equation.     This calculation has not been     validated in all clinical     situations.     eGFR's persistently     <90 mL/min signify     possible Chronic Kidney Disease.  CG4 I-STAT (LACTIC ACID)     Status: None   Collection Time    11/07/12  2:59 PM      Result Value Range   Lactic Acid, Venous 1.72  0.5 - 2.2 mmol/L  CBC WITH DIFFERENTIAL     Status: Abnormal   Collection Time    11/07/12  4:55 PM      Result Value Range   WBC 11.1 (*) 4.0 - 10.5 K/uL   RBC 4.43  4.22 - 5.81 MIL/uL   Hemoglobin 14.0  13.0 - 17.0 g/dL   HCT 16.1  09.6 - 04.5 %   MCV 90.3  78.0 - 100.0 fL   MCH 31.6  26.0 - 34.0 pg   MCHC 35.0  30.0 - 36.0 g/dL   RDW 40.9  81.1 - 91.4 %   Platelets 116 (*) 150 - 400 K/uL   Comment: PLATELET COUNT CONFIRMED BY SMEAR     REPEATED TO VERIFY     SPECIMEN CHECKED FOR CLOTS   Neutrophils Relative % 79 (*) 43 - 77 %   Neutro Abs 8.8 (*) 1.7 - 7.7 K/uL   Lymphocytes Relative 10 (*) 12 - 46 %   Lymphs Abs 1.1  0.7 - 4.0 K/uL   Monocytes Relative 11  3 - 12 %   Monocytes Absolute 1.3 (*) 0.1 - 1.0 K/uL   Eosinophils Relative 0  0 - 5 %   Eosinophils Absolute 0.0  0.0 - 0.7 K/uL   Basophils Relative 0  0 - 1 %   Basophils Absolute 0.0  0.0 - 0.1 K/uL  COMPREHENSIVE METABOLIC PANEL     Status: Abnormal   Collection Time    11/07/12  4:55 PM      Result Value Range   Sodium 136  135 - 145 mEq/L   Potassium 4.0  3.5 - 5.1 mEq/L   Chloride 99  96 - 112 mEq/L   CO2 29  19 - 32 mEq/L   Glucose, Bld 105 (*) 70 - 99 mg/dL   BUN 20  6 - 23 mg/dL   Creatinine, Ser 7.82  0.50 - 1.35 mg/dL   Calcium 9.2  8.4 - 95.6 mg/dL   Total Protein 6.9  6.0 - 8.3 g/dL   Albumin 3.3 (*) 3.5 - 5.2 g/dL   AST 14  0 - 37 U/L   ALT 12  0 - 53 U/L   Alkaline Phosphatase 70  39 - 117 U/L   Total Bilirubin 1.0  0.3 - 1.2 mg/dL   GFR calc non Af Amer 46 (*) >90 mL/min   GFR calc Af Amer 53 (*) >90 mL/min   Comment:            The eGFR has been  calculated     using the CKD EPI equation.     This calculation has not been     validated in all clinical     situations.     eGFR's persistently     <90 mL/min signify     possible Chronic Kidney Disease.  LIPASE, BLOOD     Status: None   Collection Time  11/07/12  4:55 PM      Result Value Range   Lipase 31  11 - 59 U/L  D-DIMER, QUANTITATIVE     Status: Abnormal   Collection Time    11/07/12  4:55 PM      Result Value Range   D-Dimer, Quant 2.44 (*) 0.00 - 0.48 ug/mL-FEU   Comment:            AT THE INHOUSE ESTABLISHED CUTOFF     VALUE OF 0.48 ug/mL FEU,     THIS ASSAY HAS BEEN DOCUMENTED     IN THE LITERATURE TO HAVE     A SENSITIVITY AND NEGATIVE     PREDICTIVE VALUE OF AT LEAST     98 TO 99%.  THE TEST RESULT     SHOULD BE CORRELATED WITH     AN ASSESSMENT OF THE CLINICAL     PROBABILITY OF DVT / VTE.  LACTIC ACID, PLASMA     Status: None   Collection Time    11/07/12  4:56 PM      Result Value Range   Lactic Acid, Venous 1.5  0.5 - 2.2 mmol/L  URINALYSIS, ROUTINE W REFLEX MICROSCOPIC     Status: Abnormal   Collection Time    11/07/12  5:02 PM      Result Value Range   Color, Urine AMBER (*) YELLOW   Comment: BIOCHEMICALS MAY BE AFFECTED BY COLOR   APPearance HAZY (*) CLEAR   Specific Gravity, Urine 1.028  1.005 - 1.030   pH 5.5  5.0 - 8.0   Glucose, UA NEGATIVE  NEGATIVE mg/dL   Hgb urine dipstick NEGATIVE  NEGATIVE   Bilirubin Urine SMALL (*) NEGATIVE   Ketones, ur 15 (*) NEGATIVE mg/dL   Protein, ur 161 (*) NEGATIVE mg/dL   Urobilinogen, UA 1.0  0.0 - 1.0 mg/dL   Nitrite NEGATIVE  NEGATIVE   Leukocytes, UA TRACE (*) NEGATIVE  URINE MICROSCOPIC-ADD ON     Status: Abnormal   Collection Time    11/07/12  5:02 PM      Result Value Range   Squamous Epithelial / LPF FEW (*) RARE   WBC, UA 3-6  <3 WBC/hpf   Bacteria, UA FEW (*) RARE   Urine-Other MUCOUS PRESENT    HIV ANTIBODY (ROUTINE TESTING)     Status: None   Collection Time    11/07/12 10:15 PM       Result Value Range   HIV NON REACTIVE  NON REACTIVE   Comment: Performed at [facility]  CBC     Status: Abnormal   Collection Time    11/07/12 10:15 PM      Result Value Range   WBC 9.6  4.0 - 10.5 K/uL   RBC 4.07 (*) 4.22 - 5.81 MIL/uL   Hemoglobin 12.8 (*) 13.0 - 17.0 g/dL   HCT 09.6 (*) 04.5 - 40.9 %   MCV 90.7  78.0 - 100.0 fL   MCH 31.4  26.0 - 34.0 pg   MCHC 34.7  30.0 - 36.0 g/dL   RDW 81.1  91.4 - 78.2 %   Platelets 105 (*) 150 - 400 K/uL   Comment: CONSISTENT WITH PREVIOUS RESULT  CREATININE, SERUM     Status: Abnormal   Collection Time    11/07/12 10:15 PM      Result Value Range   Creatinine, Ser 1.24  0.50 - 1.35 mg/dL   GFR calc non Af Amer 50 (*) >90 mL/min  GFR calc Af Amer 58 (*) >90 mL/min   Comment:            The eGFR has been calculated     using the CKD EPI equation.     This calculation has not been     validated in all clinical     situations.     eGFR's persistently     <90 mL/min signify     possible Chronic Kidney Disease.  CBC     Status: Abnormal   Collection Time    11/08/12  4:00 AM      Result Value Range   WBC 11.4 (*) 4.0 - 10.5 K/uL   RBC 4.33  4.22 - 5.81 MIL/uL   Hemoglobin 13.7  13.0 - 17.0 g/dL   HCT 81.1  91.4 - 78.2 %   MCV 91.2  78.0 - 100.0 fL   MCH 31.6  26.0 - 34.0 pg   MCHC 34.7  30.0 - 36.0 g/dL   RDW 95.6  21.3 - 08.6 %   Platelets 119 (*) 150 - 400 K/uL   Comment: CONSISTENT WITH PREVIOUS RESULT  BASIC METABOLIC PANEL     Status: Abnormal   Collection Time    11/08/12  4:00 AM      Result Value Range   Sodium 137  135 - 145 mEq/L   Potassium 4.1  3.5 - 5.1 mEq/L   Chloride 101  96 - 112 mEq/L   CO2 27  19 - 32 mEq/L   Glucose, Bld 111 (*) 70 - 99 mg/dL   BUN 19  6 - 23 mg/dL   Creatinine, Ser 5.78  0.50 - 1.35 mg/dL   Calcium 9.1  8.4 - 46.9 mg/dL   GFR calc non Af Amer 47 (*) >90 mL/min   GFR calc Af Amer 54 (*) >90 mL/min   Comment:            The eGFR has been calculated     using the  CKD EPI equation.     This calculation has not been     validated in all clinical     situations.     eGFR's persistently     <90 mL/min signify     possible Chronic Kidney Disease.  STREP PNEUMONIAE URINARY ANTIGEN     Status: None   Collection Time    11/08/12  5:51 AM      Result Value Range   Strep Pneumo Urinary Antigen NEGATIVE  NEGATIVE   Comment:            Infection due to S. pneumoniae     cannot be absolutely ruled out     since the antigen present     may be below the detection limit     of the test.  SEDIMENTATION RATE     Status: Abnormal   Collection Time    11/08/12  2:10 PM      Result Value Range   Sed Rate 60 (*) 0 - 16 mm/hr    Dg Chest 2 View  11/07/2012   *RADIOLOGY REPORT*  Clinical Data: Fever.  Dizziness.  Back pain.  CHEST - 2 VIEW  Comparison: 11/02/2012 the  Findings: Mild streaky opacity is seen in the posterior right lower lobe, which is new and suspicious for pneumonia. The left lung remains clear.  No evidence of pleural effusion.  Heart size is within normal limits.  No mass or lymphadenopathy identified.  IMPRESSION: New mild posterior  right lower lobe opacity, suspicious for pneumonia. Post-treatment  radiographic followup recommended to confirm resolution.   Original Report Authenticated By: Myles Rosenthal, M.D.     Assessment/Plan: 79 y/o male with chronic pulsatile tinnitus right ear, HA, imbalance, and double vision but an essentially unremarkable neuro-exam. Neuro-imaging findings showed no evidence of stroke but occluded left transverse sinus and significant narrowing straight sinus. Some concern about possible dural  Discussed with interventional radiology and they will see patient tomorrow with plan for angiogram on Friday, as patient is clinically stable at this moment. Will follow up after procedure.   Wyatt Portela, MD [facility] [phone]  11/08/2012, 4:46 PM

## 2012-11-08 NOTE — Progress Notes (Signed)
[facility] PROGRESS NOTE  Shandon Burlingame Drozdowski AVW:[id number] DOB: December 01, 1922 DOA: 11/07/2012 PCP: Rogelia Boga, MD  Assessment/Plan: 1. CAP; continue levofloxacin day 2/7 2. 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 We then consider contacting Dr. Julieanne Cotton  interventional  radiologist to evaluate for surgical correction if warranted. 3. Polymyalgia rheumatica; will increase patient's prednisone back to 5 mg is the dose that is listed for his PTA meds, obtain ESR CRP  4. Shingles; patient had been on acyclovir for 6 weeks, DC 5. Memory loss; patient alert and oriented x4 however has some difficulty: When and why  medication were started and how being taken 6. PUD; continue to 7. Hypothyroidism; review of patient's PTA medications show that patient is not on any thyroid medication will obtain TSH/free T4.     Code Status: Full Disposition Plan: ?   Consultants:  Neurology  Procedures:  Blood cultures P  Urine culture P  CXR 11/02/2012 New mild posterior right lower lobe opacity, suspicious for  pneumonia. Post-treatment radiographic followup recommended to confirm resolution.  Brain MRA/MRI with and without contrast: 11/03/2012 MRA HEAD  Findings:  Abnormal vessels in the region of the left sigmoid sinus,  transverse sinus and jugular bulb suggestive of dural fistula.  Anterior and posterior circulation without medium or large size  vessel significant stenosis or occlusion.  Mild branch vessel irregularity.  IMPRESSION:  Abnormal vessels in the region of the left sigmoid sinus,  transverse sinus and jugular bulb suggestive of dural fistula.   Abnormal vessels in the region of the left sigmoid sinus,  transverse sinus and jugular bulb suggestive of dural fistula. This  is causing incomplete filling and narrowing of the left transverse  sinus/sigmoid sinus and jugular bulb.     Antibiotics:  Acyclovir date  2, levofloxacin day 2   HPI/Subjective: QUENTEZ LOBER is a 79 y.o. male       PMHx hypothyroidism, memory loss,  TIA, recently diagnosed shingles, hypertension. Presented SOB, Fever, cough and RT  sided chest pain worsens with cough. Fever of 101 at home and febrile in ED, was found to have pneumonia on CXR. He was referred to medical service for admission for management of cap. 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    Objective: Filed Vitals:   11/07/12 1937 11/07/12 2015 11/07/12 2110 11/08/12 0530  BP: 113/66 129/60 130/59 103/55  Pulse: 94 84 89 95  Temp:   98.3 F (36.8 C) 98.3 F (36.8 C)  TempSrc:   Oral Oral  Resp: 22 17 18 18   Height:   5\' 10"  (1.778 m)   Weight:   77.928 kg (171 lb 12.8 oz)   SpO2: 96% 94% 96% 96%    Intake/Output Summary (Last 24 hours) at 11/08/12 0756 Last data filed at 11/08/12 0016  Gross per 24 hour  Intake      0 ml  Output    500 ml  Net   -500 ml   Filed Weights   11/07/12 2110  Weight: 77.928 kg (171 lb 12.8 oz)    Exam:   General: Alert, somewhat confused as to recent treatments.  Cardiovascular: Regular rate, negative murmurs rubs gallops, DP/PT pulse 2+ a lateral  Respiratory: Clear to auscultation except for decreased breath sounds in the right lower lobe  Abdomen: Soft nontender nondistended plus bowel sounds   Musculoskeletal; multiple symmetric joint  pain primarily the shoulders elbows and neck  Data Reviewed: Basic Metabolic Panel:  Recent Labs Lab 11/02/12 1924 11/07/12 1432 11/07/12 1655 11/07/12 2215 11/08/12 0400  NA 144 136 136  --  137  K 4.4 4.1 4.0  --  4.1  CL 108 99 99  --  101  CO2 28 29 29   --  27  GLUCOSE 103* 122* 105*  --  111*  BUN 34* 20 20  --  19  CREATININE 1.85* 1.33 1.33 1.24 1.31  CALCIUM 9.7 9.4 9.2  --  9.1   Liver Function Tests:  Recent Labs Lab 11/02/12 1924 11/07/12 1432  11/07/12 1655  AST 20 15 14   ALT 16 14 12   ALKPHOS 68 69 70  BILITOT 0.6 1.0 1.0  PROT 6.7 7.0 6.9  ALBUMIN 4.0 3.3* 3.3*    Recent Labs Lab 11/07/12 1655  LIPASE 31   No results found for this basename: AMMONIA,  in the last 168 hours CBC:  Recent Labs Lab 11/02/12 1937 11/07/12 1432 11/07/12 1655 11/07/12 2215 11/08/12 0400  WBC 8.2 10.9* 11.1* 9.6 11.4*  NEUTROABS  --  8.5* 8.8*  --   --   HGB 14.5 14.4 14.0 12.8* 13.7  HCT 45.6 41.1 40.0 36.9* 39.5  MCV 96.3 90.7 90.3 90.7 91.2  PLT  --  124* 116* 105* 119*   Cardiac Enzymes: No results found for this basename: CKTOTAL, CKMB, CKMBINDEX, TROPONINI,  in the last 168 hours BNP (last 3 results) No results found for this basename: PROBNP,  in the last 8760 hours CBG: No results found for this basename: GLUCAP,  in the last 168 hours  No results found for this or any previous visit (from the past 240 hour(s)).   Studies: Dg Chest 2 View  11/07/2012   *RADIOLOGY REPORT*  Clinical Data: Fever.  Dizziness.  Back pain.  CHEST - 2 VIEW  Comparison: 11/02/2012 the  Findings: Mild streaky opacity is seen in the posterior right lower lobe, which is new and suspicious for pneumonia. The left lung remains clear.  No evidence of pleural effusion.  Heart size is within normal limits.  No mass or lymphadenopathy identified.  IMPRESSION: New mild posterior right lower lobe opacity, suspicious for pneumonia. Post-treatment  radiographic followup recommended to confirm resolution.   Original Report Authenticated By: Myles Rosenthal, M.D.    Scheduled Meds: . acyclovir  400 mg Oral QHS  . aspirin EC  81 mg Oral Daily  . cyanocobalamin  500 mcg Oral Daily  . enoxaparin (LOVENOX) injection  40 mg Subcutaneous Q24H  . famotidine  20 mg Oral BID  . fluticasone  2 spray Each Nare Daily  . folic acid  1 mg Oral Daily  . [START ON 11/09/2012] levofloxacin  750 mg Intravenous Q48H  . metoprolol succinate  25 mg Oral Daily  . pantoprazole  40  mg Oral Daily  . predniSONE  2 mg Oral Daily  . tamsulosin  0.4 mg Oral Daily   Continuous Infusions:   Active Problems:   HYPOTHYROIDISM   PEPTIC ULCER DISEASE   Memory loss   Polymyalgia rheumatica   CAP (community acquired pneumonia)   Shingles    Time spent: 40 min    Christine Schiefelbein, J  [facility] Pager [phone]. If 7PM-7AM, please contact night-coverage at [email or web address], password [facility] 11/08/2012, 7:56 AM  LOS: 1 day

## 2012-11-08 NOTE — Progress Notes (Signed)
RT completed protocol assessment.  Patient has no respiratory history and takes no respiratory medicines at home.  Patient is currently 94% on RA, RR 16 HR 86, BBS are clear and diminished (more diminished on right than left).  RT is changing breathing treatments to Q4prn per protocol.

## 2012-11-09 ENCOUNTER — Other Ambulatory Visit: Payer: Self-pay | Admitting: Radiology

## 2012-11-09 DIAGNOSIS — Z8679 Personal history of other diseases of the circulatory system: Secondary | ICD-10-CM

## 2012-11-09 DIAGNOSIS — M353 Polymyalgia rheumatica: Secondary | ICD-10-CM | POA: Diagnosis not present

## 2012-11-09 DIAGNOSIS — J189 Pneumonia, unspecified organism: Secondary | ICD-10-CM | POA: Diagnosis not present

## 2012-11-09 DIAGNOSIS — H3530 Unspecified macular degeneration: Secondary | ICD-10-CM | POA: Diagnosis not present

## 2012-11-09 LAB — LEGIONELLA ANTIGEN, URINE: Legionella Antigen, Urine: NEGATIVE

## 2012-11-09 MED ORDER — SODIUM CHLORIDE 0.9 % IV SOLN: Freq: Once | INTRAVENOUS | Status: DC

## 2012-11-09 NOTE — Progress Notes (Signed)
[facility] PROGRESS NOTE  [name] QIH:[id number] DOB: [birth year] DOA: 11/07/2012 PCP: Rogelia Boga, MD  Assessment/Plan: 1. CAP; continue levofloxacin day 3/7. No SOB 2. Thrombosis of  lateral venous sinus; consulted with neurology ( Dr Georga Hacking, Leroy Kennedy) and he believes Pt warrants an Angiogram. Will be performed on Friday, by IR. 3. Polymyalgia rheumatica; continue prednisone 5 mg QD. Pain has resolved. (Note ESR=60), No HA or vison changes currently, If any of those SSx appear would have short for ordering Temporal Artery biopsy  4. Shingles; patient had been on acyclovir for 6 weeks, DC 5. Memory loss; patient alert and oriented x4 however today,much more interactive.  6. PUD; continue current meds 7. Hypothyroidism; review of patient's PTA medications show that patient is not on any thyroid medication. Currently Pt Euthyrod.     Code Status: Full Disposition Plan: ?   Consultants:  Neurology  Procedures:  Blood cultures P  Urine culture P  CXR 11/02/2012 New mild posterior right lower lobe opacity, suspicious for  pneumonia. Post-treatment radiographic followup recommended to confirm resolution.  Brain MRA/MRI with and without contrast: 11/03/2012 MRA HEAD  Findings:  Abnormal vessels in the region of the left sigmoid sinus,  transverse sinus and jugular bulb suggestive of dural fistula.  Anterior and posterior circulation without medium or large size  vessel significant stenosis or occlusion.  Mild branch vessel irregularity.  IMPRESSION:  Abnormal vessels in the region of the left sigmoid sinus,  transverse sinus and jugular bulb suggestive of dural fistula.   Abnormal vessels in the region of the left sigmoid sinus,  transverse sinus and jugular bulb suggestive of dural fistula. This  is causing incomplete filling and narrowing of the left transverse  sinus/sigmoid sinus and jugular bulb.     Antibiotics:  Acyclovir discontenued,  levofloxacin day 3   HPI/Subjective: [name] is a 79 y.o.has PMHx hypothyroidism, memory loss,  TIA, recently diagnosed shingles, hypertension. Presented SOB, Fever, cough and RT  sided chest pain worsens with cough. Fever of 101 at home and febrile in ED, was found to have pneumonia on CXR. He was referred to medical service for admission for management of cap. 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   TODAY feels improved, stiffness/pain increase of steroid  Objective: Filed Vitals:   11/08/12 1351 11/08/12 1710 11/08/12 2119 11/09/12 0525  BP: 102/49 105/53 128/60 110/49  Pulse: 91 84 80 84  Temp: 98.2 F (36.8 C) 99.3 F (37.4 C) 98.4 F (36.9 C) 98.8 F (37.1 C)  TempSrc: Oral Oral Oral Oral  Resp: 20 20 18 18   Height:      Weight:   79.062 kg (174 lb 4.8 oz)   SpO2: 99% 98% 96% 96%    Intake/Output Summary (Last 24 hours) at 11/09/12 0843 Last data filed at 11/08/12 1900  Gross per 24 hour  Intake    720 ml  Output      0 ml  Net    720 ml   Filed Weights   11/07/12 2110 11/08/12 2119  Weight: 77.928 kg (171 lb 12.8 oz) 79.062 kg (174 lb 4.8 oz)    Exam:   General: Alert, somewhat confused as to recent treatments.  Cardiovascular: Regular rate, negative murmurs rubs gallops, DP/PT pulse 2+ a lateral  Respiratory: Clear to auscultation except for decreased breath sounds in the right lower lobe  Abdomen: Soft nontender nondistended plus  bowel sounds   Musculoskeletal; multiple symmetric joint pain primarily the shoulders elbows and neck  Data Reviewed: Basic Metabolic Panel:  Recent Labs Lab 11/02/12 1924 11/07/12 1432 11/07/12 1655 11/07/12 2215 11/08/12 0400  NA 144 136 136  --  137  K 4.4 4.1 4.0  --  4.1  CL 108 99 99  --  101  CO2 28 29 29   --  27  GLUCOSE 103* 122* 105*  --  111*  BUN 34* 20 20  --  19  CREATININE 1.85* 1.33 1.33 1.24  1.31  CALCIUM 9.7 9.4 9.2  --  9.1   Liver Function Tests:  Recent Labs Lab 11/02/12 1924 11/07/12 1432 11/07/12 1655  AST 20 15 14   ALT 16 14 12   ALKPHOS 68 69 70  BILITOT 0.6 1.0 1.0  PROT 6.7 7.0 6.9  ALBUMIN 4.0 3.3* 3.3*    Recent Labs Lab 11/07/12 1655  LIPASE 31   No results found for this basename: AMMONIA,  in the last 168 hours CBC:  Recent Labs Lab 11/02/12 1937 11/07/12 1432 11/07/12 1655 11/07/12 2215 11/08/12 0400  WBC 8.2 10.9* 11.1* 9.6 11.4*  NEUTROABS  --  8.5* 8.8*  --   --   HGB 14.5 14.4 14.0 12.8* 13.7  HCT 45.6 41.1 40.0 36.9* 39.5  MCV 96.3 90.7 90.3 90.7 91.2  PLT  --  124* 116* 105* 119*   Cardiac Enzymes: No results found for this basename: CKTOTAL, CKMB, CKMBINDEX, TROPONINI,  in the last 168 hours BNP (last 3 results) No results found for this basename: PROBNP,  in the last 8760 hours CBG: No results found for this basename: GLUCAP,  in the last 168 hours  Recent Results (from the past 240 hour(s))  URINE CULTURE     Status: None   Collection Time    11/07/12  5:02 PM      Result Value Range Status   Specimen Description URINE, CLEAN CATCH   Final   Special Requests NONE   Final   Culture  Setup Time     Final   Value: 11/07/2012 18:43     Performed at [facility] Count     Final   Value: 7,000 COLONIES/ML     Performed at [facility]   Culture     Final   Value: INSIGNIFICANT GROWTH     Performed at [facility]   Report Status 11/08/2012 FINAL   Final     Studies: Dg Chest 2 View  11/07/2012   *RADIOLOGY REPORT*  Clinical Data: Fever.  Dizziness.  Back pain.  CHEST - 2 VIEW  Comparison: 11/02/2012 the  Findings: Mild streaky opacity is seen in the posterior right lower lobe, which is new and suspicious for pneumonia. The left lung remains clear.  No evidence of pleural effusion.  Heart size is within normal limits.  No mass or lymphadenopathy identified.  IMPRESSION: New mild  posterior right lower lobe opacity, suspicious for pneumonia. Post-treatment  radiographic followup recommended to confirm resolution.   Original Report Authenticated By: Myles Rosenthal, M.D.    Scheduled Meds: . aspirin EC  81 mg Oral Daily  . cyanocobalamin  500 mcg Oral Daily  . enoxaparin (LOVENOX) injection  40 mg Subcutaneous Q24H  . famotidine  20 mg Oral BID  . fluticasone  2 spray Each Nare Daily  . folic acid  1 mg Oral Daily  . levofloxacin  750 mg Intravenous Q48H  .  metoprolol succinate  25 mg Oral Daily  . pantoprazole  40 mg Oral Daily  . predniSONE  5 mg Oral Q breakfast  . tamsulosin  0.4 mg Oral Daily   Continuous Infusions:   Active Problems:   HYPOTHYROIDISM   PEPTIC ULCER DISEASE   Memory loss   Polymyalgia rheumatica   CAP (community acquired pneumonia)   Shingles   Thrombosis of lateral venous sinus    Time spent: 40 min    WOODS, CURTIS, J  [facility] Pager [phone]. If 7PM-7AM, please contact night-coverage at [email or web address], password [facility] 11/09/2012, 8:43 AM  LOS: 2 days

## 2012-11-09 NOTE — ED Provider Notes (Addendum)
Medical screening examination/treatment/procedure(s) were conducted as a shared visit with resident physician and myself.  I personally evaluated the patient during the encounter.  I saw and examined the patient. Febrile/tachy here. Suspicious for pna. Found to have CAP. Will admit.   Junius Argyle, MD 11/09/12 1101  Junius Argyle, MD 11/16/12 [phone]

## 2012-11-10 ENCOUNTER — Inpatient Hospital Stay (HOSPITAL_COMMUNITY): Payer: Medicare Other

## 2012-11-10 ENCOUNTER — Encounter (HOSPITAL_COMMUNITY): Payer: Self-pay | Admitting: Radiology

## 2012-11-10 ENCOUNTER — Inpatient Hospital Stay (HOSPITAL_COMMUNITY)
Admit: 2012-11-10 | Discharge: 2012-11-10 | Disposition: A | Discharge status: Home / Self Care | Payer: Medicare Other | Attending: Interventional Radiology | Admitting: Interventional Radiology

## 2012-11-10 DIAGNOSIS — R413 Other amnesia: Secondary | ICD-10-CM | POA: Diagnosis not present

## 2012-11-10 DIAGNOSIS — R51 Headache: Secondary | ICD-10-CM | POA: Diagnosis not present

## 2012-11-10 DIAGNOSIS — L089 Local infection of the skin and subcutaneous tissue, unspecified: Secondary | ICD-10-CM | POA: Diagnosis not present

## 2012-11-10 DIAGNOSIS — K279 Peptic ulcer, site unspecified, unspecified as acute or chronic, without hemorrhage or perforation: Secondary | ICD-10-CM | POA: Diagnosis not present

## 2012-11-10 DIAGNOSIS — J189 Pneumonia, unspecified organism: Secondary | ICD-10-CM | POA: Diagnosis not present

## 2012-11-10 DIAGNOSIS — E039 Hypothyroidism, unspecified: Secondary | ICD-10-CM

## 2012-11-10 DIAGNOSIS — L988 Other specified disorders of the skin and subcutaneous tissue: Secondary | ICD-10-CM | POA: Diagnosis present

## 2012-11-10 LAB — BASIC METABOLIC PANEL
BUN: 20 mg/dL (ref 6–23)
Calcium: 9 mg/dL (ref 8.4–10.5)
Creatinine, Ser: 1.42 mg/dL — ABNORMAL HIGH (ref 0.50–1.35)
GFR calc Af Amer: 49 mL/min — ABNORMAL LOW (ref >90)
GFR calc non Af Amer: 43 mL/min — ABNORMAL LOW (ref >90)

## 2012-11-10 LAB — CBC WITH DIFFERENTIAL/PLATELET
Basophils Absolute: 0 10*3/uL (ref 0.0–0.1)
Basophils Relative: 0 % (ref 0–1)
HCT: 34.5 % — ABNORMAL LOW (ref 39.0–52.0)
Lymphocytes Relative: 18 % (ref 12–46)
MCHC: 33.3 g/dL (ref 30.0–36.0)
Monocytes Absolute: 0.5 10*3/uL (ref 0.1–1.0)
Neutro Abs: 4.6 10*3/uL (ref 1.7–7.7)
Platelets: 142 10*3/uL — ABNORMAL LOW (ref 150–400)
RDW: 13.1 % (ref 11.5–15.5)
WBC: 6.3 10*3/uL (ref 4.0–10.5)

## 2012-11-10 LAB — PROTIME-INR: Prothrombin Time: 13.5 seconds (ref 11.6–15.2)

## 2012-11-10 LAB — APTT: aPTT: 40 seconds — ABNORMAL HIGH (ref 24–37)

## 2012-11-10 MED ORDER — HEPARIN SOD (PORK) LOCK FLUSH 100 UNIT/ML IV SOLN
INTRAVENOUS | Status: AC | PRN
  Administered 2012-11-10: 500 [IU] via INTRAVENOUS

## 2012-11-10 MED ORDER — SODIUM CHLORIDE 0.9 % IV SOLN
INTRAVENOUS | Status: AC | PRN
  Administered 2012-11-10: 75 mL/h via INTRAVENOUS

## 2012-11-10 MED ORDER — ENOXAPARIN SODIUM 40 MG/0.4ML ~~LOC~~ SOLN
40.0000 mg | SUBCUTANEOUS | Status: DC
  Administered 2012-11-10: 40 mg via SUBCUTANEOUS
  Filled 2012-11-10 (×2): qty 0.4

## 2012-11-10 MED ORDER — IOHEXOL 300 MG/ML  SOLN
150.0000 mL | Freq: Once | INTRAMUSCULAR | Status: AC | PRN
  Administered 2012-11-10: 80 mL via INTRA_ARTERIAL

## 2012-11-10 MED ORDER — FENTANYL CITRATE 0.05 MG/ML IJ SOLN
INTRAMUSCULAR | Status: AC | PRN
  Administered 2012-11-10: 25 ug via INTRAVENOUS

## 2012-11-10 MED ORDER — SODIUM CHLORIDE 0.9 % IV SOLN: INTRAVENOUS | Status: AC

## 2012-11-10 MED ORDER — MIDAZOLAM HCL 2 MG/2ML IJ SOLN
INTRAMUSCULAR | Status: AC | PRN
  Administered 2012-11-10: 1 mg via INTRAVENOUS

## 2012-11-10 NOTE — ED Notes (Signed)
O2 d/c'd 

## 2012-11-10 NOTE — H&P (Signed)
[name] is an 79 y.o. male.   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 IBS (irritable bowel syndrome)   . Hemorrhoids   . Diverticulosis   . GERD (gastroesophageal reflux disease)   . Tubular adenoma of colon 07/1998  . Anxiety and depression   . Elevated prostate specific antigen (PSA)   . Vitamin B12 deficiency   . UTI (lower urinary tract infection)   . Arthritis   . Sciatica   . Anemia   . TIA (transient ischemic attack)   . Peptic ulcer disease   . Hyperlipidemia   . CAD (coronary artery disease)     PCI 2006, Stress Perfusion Study 2011  . Hypertension   . Rheumatoid arthritis(714.0)   . Polymyalgia     Past Surgical History  Procedure Laterality Date  . Ptca  2006    Stenting of proximal LAD w/ 75 % stenosis reduced to 0%  . Cataract extraction    . Cholecystectomy  2001  . Hernia repair    . Hip fracture surgery  03/2007    Right  . Salivary stone removal  11/2007    Family History  Problem Relation Age of Onset  . Colon cancer Maternal Uncle 50    ish  . Coronary artery disease Father    Social History:  reports that he quit smoking about 40 years ago. His smoking use included Cigarettes. He smoked 0.00 packs per day. He has never used smokeless tobacco. He reports that he does not drink alcohol or use illicit drugs.  Allergies:  Allergies  Allergen Reactions  . Amoxicillin     REACTION: unspecified  . Doxycycline   . Erythromycin     REACTION: unspecified  . Penicillins     rash  . Sulfamethoxazole W-Trimethoprim     unknown    Medications Prior to Admission  Medication Dose Route Frequency Provider Last Rate Last Dose  . cyanocobalamin ((VITAMIN B-12)) injection 1,000 mcg   1,000 mcg Intramuscular Once Jonita Albee, MD       Medications Prior to Admission  Medication Sig Dispense Refill  . acyclovir (ZOVIRAX) 400 MG tablet Take 400 mg by mouth daily.       Marland Kitchen aspirin 81 MG tablet Take 81 mg by mouth daily.      Marland Kitchen Co-Enzyme Q-10 100 MG CAPS Take 300 mg by mouth daily.      . cyanocobalamin 500 MCG tablet Take 500 mcg by mouth daily.      Marland Kitchen desonide (DESOWEN) 0.05 % ointment Apply 1 application topically as needed.      Marland Kitchen Dextromethorphan-Guaifenesin (MUCINEX DM PO) Take 1 capsule by mouth daily.      . fluticasone (FLONASE) 50 MCG/ACT nasal spray Place 2 sprays into the nose daily.  16 g  6  . folic acid (FOLVITE) 1 MG tablet Take 1 mg by mouth daily.      . metoprolol succinate (TOPROL-XL) 25 MG 24 hr tablet Take 1 tablet (25 mg total) by mouth daily.  90 tablet  2  . Multiple Vitamins-Minerals (PRESERVISION/LUTEIN PO) Take 1 tablet by mouth 2 (two) times daily.      Marland Kitchen NEXIUM 40 MG capsule Take 40 mg by mouth daily before breakfast.       .  nitroGLYCERIN (NITROSTAT) 0.4 MG SL tablet Place 1 tablet (0.4 mg total) under the tongue every 5 (five) minutes as needed.  25 tablet  6  . predniSONE (DELTASONE) 5 MG tablet Take 5 mg by mouth daily.      . Ranibizumab (LUCENTIS IO) Inject 1 each into the eye every 6 (six) weeks.      . ranitidine (ZANTAC) 150 MG tablet Take 1 tablet (150 mg total) by mouth at bedtime.  30 tablet  0  . tamsulosin (FLOMAX) 0.4 MG CAPS capsule Take 1 capsule (0.4 mg total) by mouth daily.  30 capsule  0  . traMADol (ULTRAM) 50 MG tablet Take 1 tablet (50 mg total) by mouth every 8 (eight) hours as needed for pain.  90 tablet  4  . Triamcinolone Acetonide (TRIAMCINOLONE 0.1 % CREAM : EUCERIN) CREA Apply 1 application topically as needed.        Results for orders placed during the hospital encounter of 11/07/12 (from the past 48 hour(s))  SEDIMENTATION RATE     Status: Abnormal   Collection Time    11/08/12  2:10 PM      Result Value Range    Sed Rate 60 (*) 0 - 16 mm/hr  C-REACTIVE PROTEIN     Status: Abnormal   Collection Time    11/08/12  2:10 PM      Result Value Range   CRP 24.0 (*) <0.60 mg/dL   Comment: Performed at [facility]  TSH     Status: None   Collection Time    11/08/12  2:10 PM      Result Value Range   TSH 1.998  0.350 - 4.500 uIU/mL   Comment: Performed at [facility]  T4, FREE     Status: None   Collection Time    11/08/12  2:10 PM      Result Value Range   Free T4 0.93  0.80 - 1.80 ng/dL   Comment: Performed at [facility]  APTT     Status: Abnormal   Collection Time    11/10/12  5:55 AM      Result Value Range   aPTT 40 (*) 24 - 37 seconds   Comment:            IF BASELINE aPTT IS ELEVATED,     SUGGEST PATIENT RISK ASSESSMENT     BE USED TO DETERMINE APPROPRIATE     ANTICOAGULANT THERAPY.  BASIC METABOLIC PANEL     Status: Abnormal   Collection Time    11/10/12  5:55 AM      Result Value Range   Sodium 139  135 - 145 mEq/L   Potassium 3.8  3.5 - 5.1 mEq/L   Chloride 104  96 - 112 mEq/L   CO2 26  19 - 32 mEq/L   Glucose, Bld 102 (*) 70 - 99 mg/dL   BUN 20  6 - 23 mg/dL   Creatinine, Ser 8.11 (*) 0.50 - 1.35 mg/dL   Calcium 9.0  8.4 - 91.4 mg/dL   GFR calc non Af Amer 43 (*) >90 mL/min   GFR calc Af Amer 49 (*) >90 mL/min   Comment: (NOTE)     The eGFR has been calculated using the CKD EPI equation.     This calculation has not been validated in all clinical situations.     eGFR's persistently <90 mL/min signify possible Chronic Kidney     Disease.  CBC WITH DIFFERENTIAL  Status: Abnormal   Collection Time    11/10/12  5:55 AM      Result Value Range   WBC 6.3  4.0 - 10.5 K/uL   RBC 3.82 (*) 4.22 - 5.81 MIL/uL   Hemoglobin 11.5 (*) 13.0 - 17.0 g/dL   HCT 16.1 (*) 09.6 - 04.5 %   MCV 90.3  78.0 - 100.0 fL   MCH 30.1  26.0 - 34.0 pg   MCHC 33.3  30.0 - 36.0 g/dL   RDW 40.9  81.1 - 91.4 %   Platelets 142 (*) 150 - 400 K/uL   Neutrophils Relative  % 73  43 - 77 %   Neutro Abs 4.6  1.7 - 7.7 K/uL   Lymphocytes Relative 18  12 - 46 %   Lymphs Abs 1.1  0.7 - 4.0 K/uL   Monocytes Relative 8  3 - 12 %   Monocytes Absolute 0.5  0.1 - 1.0 K/uL   Eosinophils Relative 1  0 - 5 %   Eosinophils Absolute 0.1  0.0 - 0.7 K/uL   Basophils Relative 0  0 - 1 %   Basophils Absolute 0.0  0.0 - 0.1 K/uL  PROTIME-INR     Status: None   Collection Time    11/10/12  5:55 AM      Result Value Range   Prothrombin Time 13.5  11.6 - 15.2 seconds   INR 1.05  0.00 - 1.49   No results found.  Review of Systems  Constitutional: Negative for fever.  HENT: Positive for ear pain and tinnitus. Negative for neck pain.   Respiratory: Negative for shortness of breath.   Cardiovascular: Negative for chest pain.  Gastrointestinal: Negative for nausea and vomiting.  Musculoskeletal: Positive for myalgias.  Neurological: Positive for dizziness, weakness and headaches.       Imbalanced but better  Psychiatric/Behavioral: Positive for memory loss.    Blood pressure 131/53, pulse 83, temperature 98.3 F (36.8 C), temperature source Oral, resp. rate 18, height 5\' 10"  (1.778 m), weight 174 lb (78.926 kg), SpO2 97.00%. Physical Exam  Constitutional: He is oriented to person, place, and time. He appears well-nourished.  HENT:  Complains of Rt tinnitus  Cardiovascular: Normal rate, regular rhythm and normal heart sounds.   No murmur heard. Respiratory: Effort normal and breath sounds normal. He has no wheezes.  GI: There is no tenderness.  Musculoskeletal: Normal range of motion.  Slight imbalanced- better  Neurological: He is alert and oriented to person, place, and time.  Skin: Skin is warm.  Psychiatric: He has a normal mood and affect. His behavior is normal. Thought content normal.  Complains of memory loss- but alert and oriented completely     Assessment/Plan R tinnitus for few yrs HA; imbalance; AMS Admitted for PNA work up reveals abn MRI: prob  dural AV fistula Scheduled now for cerebral arteriogram per Dr Kraus/Dr Leroy Kennedy Pt aware of procedure benefits and risks and agreeable to proceed Consent signed and in chart  Rasean Joos A 11/10/2012, 10:28 AM

## 2012-11-10 NOTE — ED Notes (Signed)
MD at bedside. Explaining findings to pt. 

## 2012-11-10 NOTE — Progress Notes (Signed)
[facility] PROGRESS NOTE  [name] YQM:[id number] DOB: Aug 17, 1922 DOA: 11/07/2012 PCP: Rogelia Boga, MD  Assessment/Plan: 1. CAP; continue levofloxacin day 4/7. No SOB 2. ECA sigmoid sinus,transverse sinus fistula.; consulted with neurology ( Dr Georga Hacking, Leroy Kennedy) and he believes Pt warrants an IR performed Angiogram today, see below results; NOTE spoke with [facility] interventional radiologist and he stated patient was on the schedule for Thursday of next week for repair of fistula 3. Polymyalgia rheumatica; continue prednisone 5 mg QD. Pain has resolved. (Note ESR=60), No HA or vison changes currently, If any of those SSx appear would have short for ordering Temporal Artery biopsy  4. Shingles; patient had been on acyclovir for 6 weeks, DCed 5. Memory loss; all patient's cognitive issues have cleared with the increase in prednisone .  6. PUD; continue current meds 7. Hypothyroidism; review of patient's PTA medications show that patient is not on any thyroid medication. Currently Pt Euthyrod.     Code Status: Full Disposition Plan:  Will discharge in a.m. if all his issues resolved.?   Consultants:  Neurology  Procedures:  Blood cultures P  Urine culture P  Cerebral angiogram 11/10/2012(performed by Dr Julieanne Cotton) S/P 4 vessel cerebral arteriogram .  RT CFa approach  Findings.  1.Fast flow lt ECA sigmoid sinus,transverse sinus fistula..  No aneurysms seen.   CXR 11/02/2012 New mild posterior right lower lobe opacity, suspicious for  pneumonia. Post-treatment radiographic followup recommended to confirm resolution.  Brain MRA/MRI with and without contrast: 11/03/2012 MRA HEAD  Findings:  Abnormal vessels in the region of the left sigmoid sinus,  transverse sinus and jugular bulb suggestive of dural fistula.  Anterior and posterior circulation without medium or large size  vessel significant stenosis or occlusion.  Mild branch vessel  irregularity.  IMPRESSION:  Abnormal vessels in the region of the left sigmoid sinus,  transverse sinus and jugular bulb suggestive of dural fistula.   Abnormal vessels in the region of the left sigmoid sinus,  transverse sinus and jugular bulb suggestive of dural fistula. This  is causing incomplete filling and narrowing of the left transverse  sinus/sigmoid sinus and jugular bulb.     Antibiotics:  Acyclovir discontenued, levofloxacin day 3   HPI/Subjective: [name] is a 79 y.o.has PMHx hypothyroidism, memory loss,  TIA, recently diagnosed shingles, hypertension. Presented SOB, Fever, cough and RT  sided chest pain worsens with cough. Fever of 101 at home and febrile in ED, was found to have pneumonia on CXR. He was referred to medical service for admission for management of cap. 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   TODAY feels improved, stiffness/pain increase of steroid  Objective: Filed Vitals:   11/10/12 1119 11/10/12 1356 11/10/12 1411 11/10/12 1430  BP: 126/68 128/68 128/61 132/70  Pulse: 73 69 69 79  Temp:    98.3 F (36.8 C)  TempSrc:    Oral  Resp: 20 16 16 17   Height:      Weight:      SpO2: 97% 96% 97% 98%    Intake/Output Summary (Last 24 hours) at 11/10/12 1505 Last data filed at 11/10/12 1324  Gross per 24 hour  Intake    120 ml  Output    675 ml  Net   -555 ml   Filed Weights   11/07/12 2110 11/08/12 2119 11/09/12 2100  Weight: 77.928 kg (171 lb 12.8 oz) 79.062  kg (174 lb 4.8 oz) 78.926 kg (174 lb)    Exam:   General: Alert, somewhat confused as to recent treatments.  Cardiovascular: Regular rate, negative murmurs rubs gallops, DP/PT pulse 2+ a lateral  Respiratory: Clear to auscultation except for decreased breath sounds in the right lower lobe  Abdomen: Soft nontender nondistended plus bowel sounds   Musculoskeletal; multiple  symmetric joint pain primarily the shoulders elbows and neck  Data Reviewed: Basic Metabolic Panel:  Recent Labs Lab 11/07/12 1432 11/07/12 1655 11/07/12 2215 11/08/12 0400 11/10/12 0555  NA 136 136  --  137 139  K 4.1 4.0  --  4.1 3.8  CL 99 99  --  101 104  CO2 29 29  --  27 26  GLUCOSE 122* 105*  --  111* 102*  BUN 20 20  --  19 20  CREATININE 1.33 1.33 1.24 1.31 1.42*  CALCIUM 9.4 9.2  --  9.1 9.0   Liver Function Tests:  Recent Labs Lab 11/07/12 1432 11/07/12 1655  AST 15 14  ALT 14 12  ALKPHOS 69 70  BILITOT 1.0 1.0  PROT 7.0 6.9  ALBUMIN 3.3* 3.3*    Recent Labs Lab 11/07/12 1655  LIPASE 31   No results found for this basename: AMMONIA,  in the last 168 hours CBC:  Recent Labs Lab 11/07/12 1432 11/07/12 1655 11/07/12 2215 11/08/12 0400 11/10/12 0555  WBC 10.9* 11.1* 9.6 11.4* 6.3  NEUTROABS 8.5* 8.8*  --   --  4.6  HGB 14.4 14.0 12.8* 13.7 11.5*  HCT 41.1 40.0 36.9* 39.5 34.5*  MCV 90.7 90.3 90.7 91.2 90.3  PLT 124* 116* 105* 119* 142*   Cardiac Enzymes: No results found for this basename: CKTOTAL, CKMB, CKMBINDEX, TROPONINI,  in the last 168 hours BNP (last 3 results) No results found for this basename: PROBNP,  in the last 8760 hours CBG: No results found for this basename: GLUCAP,  in the last 168 hours  Recent Results (from the past 240 hour(s))  URINE CULTURE     Status: None   Collection Time    11/07/12  5:02 PM      Result Value Range Status   Specimen Description URINE, CLEAN CATCH   Final   Special Requests NONE   Final   Culture  Setup Time     Final   Value: 11/07/2012 18:43     Performed at [facility] Count     Final   Value: 7,000 COLONIES/ML     Performed at [facility]     Final   Value: INSIGNIFICANT GROWTH     Performed at [facility]   Report Status 11/08/2012 FINAL   Final  CULTURE, BLOOD (ROUTINE X 2)     Status: None   Collection Time    11/07/12  5:30 PM       Result Value Range Status   Specimen Description BLOOD   Final   Special Requests BOTTLES DRAWN AEROBIC ONLY 6CC SITE NOT INDICATED   Final   Culture  Setup Time     Final   Value: 11/08/2012 11:46     Performed at [facility]   Culture     Final   Value:        BLOOD CULTURE RECEIVED NO GROWTH TO DATE CULTURE WILL BE HELD FOR 5 DAYS BEFORE ISSUING A FINAL NEGATIVE REPORT     Performed at [facility]  Report Status PENDING   Incomplete  CULTURE, BLOOD (ROUTINE X 2)     Status: None   Collection Time    11/07/12  6:40 PM      Result Value Range Status   Specimen Description BLOOD ARM LEFT   Final   Special Requests BOTTLES DRAWN AEROBIC ONLY 10CC   Final   Culture  Setup Time     Final   Value: 11/08/2012 03:14     Performed at [facility]   Culture     Final   Value:        BLOOD CULTURE RECEIVED NO GROWTH TO DATE CULTURE WILL BE HELD FOR 5 DAYS BEFORE ISSUING A FINAL NEGATIVE REPORT     Performed at [facility]   Report Status PENDING   Incomplete  CULTURE, BLOOD (ROUTINE X 2)     Status: None   Collection Time    11/07/12  6:50 PM      Result Value Range Status   Specimen Description BLOOD HAND LEFT   Final   Special Requests BOTTLES DRAWN AEROBIC ONLY 5CC   Final   Culture  Setup Time     Final   Value: 11/08/2012 03:15     Performed at [facility]   Culture     Final   Value:        BLOOD CULTURE RECEIVED NO GROWTH TO DATE CULTURE WILL BE HELD FOR 5 DAYS BEFORE ISSUING A FINAL NEGATIVE REPORT     Performed at [facility]   Report Status PENDING   Incomplete     Studies: No results found.  Scheduled Meds: . sodium chloride   Intravenous Once  . aspirin EC  81 mg Oral Daily  . cyanocobalamin  500 mcg Oral Daily  . enoxaparin (LOVENOX) injection  40 mg Subcutaneous Q24H  . famotidine  20 mg Oral BID  . fluticasone  2 spray Each Nare Daily  . folic acid  1 mg Oral Daily  . levofloxacin  750 mg Intravenous Q48H   . metoprolol succinate  25 mg Oral Daily  . pantoprazole  40 mg Oral Daily  . predniSONE  5 mg Oral Q breakfast  . tamsulosin  0.4 mg Oral Daily   Continuous Infusions: . sodium chloride      Active Problems:   HYPOTHYROIDISM   PEPTIC ULCER DISEASE   Memory loss   Polymyalgia rheumatica   CAP (community acquired pneumonia)   Shingles   Thrombosis of lateral venous sinus    Time spent: 40 min    Khasir Woodrome, J  [facility] Pager [phone]. If 7PM-7AM, please contact night-coverage at [email or web address], password [facility] 11/10/2012, 3:05 PM  LOS: 3 days

## 2012-11-10 NOTE — ED Notes (Signed)
Exoseal closure by Becky Fry, RT 

## 2012-11-10 NOTE — ED Notes (Signed)
MD at bedside. Discussing procedure with pt.

## 2012-11-10 NOTE — Procedures (Signed)
S/P 4 vessel cerebral arteriogram . RT CFa approach  Findings. 1.Fast flow lt ECA sigmoid sinus,transverse sinus fistula.. No aneurysms seen.

## 2012-11-10 NOTE — ED Notes (Signed)
o2 2l/[place] added

## 2012-11-11 DIAGNOSIS — J189 Pneumonia, unspecified organism: Secondary | ICD-10-CM | POA: Diagnosis not present

## 2012-11-11 DIAGNOSIS — R413 Other amnesia: Secondary | ICD-10-CM | POA: Diagnosis not present

## 2012-11-11 DIAGNOSIS — M353 Polymyalgia rheumatica: Secondary | ICD-10-CM | POA: Diagnosis not present

## 2012-11-11 DIAGNOSIS — I671 Cerebral aneurysm, nonruptured: Secondary | ICD-10-CM | POA: Diagnosis not present

## 2012-11-11 MED ORDER — LEVOFLOXACIN 750 MG PO TABS: 750.0000 mg | ORAL_TABLET | Freq: Every day | ORAL | Status: DC

## 2012-11-11 NOTE — Discharge Summary (Signed)
Physician Discharge Summary  [name] JYN:[id number] DOB: 1922/07/16 DOA: 11/07/2012  PCP: Rogelia Boga, MD  Admit date: 11/07/2012 Discharge date: 11/11/2012  Time spent: 35 minutes  Recommendations for Outpatient Follow-up:  1. CAP; continue levofloxacin day 5/7. No SOB  2.  ECA sigmoid sinus,transverse sinus fistula.; consulted with neurology ( Dr Georga Hacking, Leroy Kennedy) and he believes Pt warrants an IR performed Angiogram today, see below results; NOTE spoke with [facility] interventional radiologist and he stated patient was on the schedule for Thursday of next week for repair of fistula his office will contact patient for final details   3. Polymyalgia rheumatica; continue prednisone 5 mg QD. Pain has resolved.  4. Shingles; resolved; patient had been on acyclovir for 6 weeks, DCed  5.  Memory loss; all patient's cognitive issues have cleared with the increase in prednisone. Again a conversation with patient about seeing his rheumatologist to discuss permanent treatment options for his PMR since his family has some reluctance to him being: Long-term low-dose prednisone   6. PUD; continue current meds  7.  Hypothyroidism; review of patient's PTA medications show that patient is not on any thyroid medication. Currently Pt Euthyrod.    Discharge Diagnoses:  Active Problems:   HYPOTHYROIDISM   PEPTIC ULCER DISEASE   Memory loss   Polymyalgia rheumatica   CAP (community acquired pneumonia)   Shingles   Sigmoid sinus, transverse sinus Fistula   Discharge Condition: Stable  Diet recommendation: Heart Healthy  Filed Weights   11/07/12 2110 11/08/12 2119 11/09/12 2100  Weight: 77.928 kg (171 lb 12.8 oz) 79.062 kg (174 lb 4.8 oz) 78.926 kg (174 lb)    History of present illness:  [name] is a 79 y.o WM PMHx hypothyroidism, PMR, CAD, HLD,Hx TIA,Hx shingles (diagnosed 6 weeks ago),. Presented with memory loss, hypertension. Presented SOB, Fever, cough  and RT sided chest pain worsened with cough. Fever of 101 at home and febrile in ED, was found to have pneumonia on CXR. He was referred to medical service for admission for management of CAP. 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  However did not make appointment 2dary to b/c ill. Pt obtained Cerebral angiogram 11/10/2012(performed by Dr Julieanne Cotton); see below results TODAY feels ready for D/C. Negative pain/stiffness/HA/Vision changes   Procedures: Blood cultures P  Urine culture P  Cerebral angiogram 11/10/2012(performed by Dr Julieanne Cotton) S/P 4 vessel cerebral arteriogram .  RT CFa approach  Findings.  1.Fast flow lt ECA sigmoid sinus,transverse sinus fistula..  No aneurysms seen.  CXR 11/02/2012  New mild posterior right lower lobe opacity, suspicious for  pneumonia. Post-treatment radiographic followup recommended to confirm resolution.  Brain MRA/MRI with and without contrast: 11/03/2012  MRA HEAD  Findings:  Abnormal vessels in the region of the left sigmoid sinus,  transverse sinus and jugular bulb suggestive of dural fistula.  Anterior and posterior circulation without medium or large size  vessel significant stenosis or occlusion.  Mild branch vessel irregularity.  IMPRESSION:  Abnormal vessels in the region of the left sigmoid sinus,  transverse sinus and jugular bulb suggestive of dural fistula.  Abnormal vessels in the region of the left sigmoid sinus,  transverse sinus and jugular bulb suggestive of dural fistula. This  is causing incomplete filling and narrowing of the left transverse  sinus/sigmoid sinus and jugular bulb.    Consultations:  Neurology, interventional radiology  Discharge Exam: Filed  Vitals:   11/10/12 1745 11/10/12 1952 11/11/12 0510 11/11/12 0910  BP: 134/64 140/67 139/70 130/67  Pulse: 70 87 71 96  Temp: 98.5 F (36.9 C) 98 F (36.7  C) 98.4 F (36.9 C) 98 F (36.7 C)  TempSrc: Oral Oral Oral Oral  Resp: 18 18 19 18   Height:      Weight:      SpO2: 96% 97% 97% 95%    General: Alert, NAD.  Cardiovascular: Regular rate, negative murmurs rubs gallops, DP/PT pulse 2+ a lateral  Respiratory: Clear to auscultation Bilat Abdomen: Soft nontender nondistended plus bowel sounds  Musculoskeletal; all joint pain is resolved, strength in all extremities 5/5   Discharge Instructions   Future Appointments Provider Department Dept Phone   01/05/2013 10:45 AM Gordy Savers, MD [facility] [place] [phone]       Medication List    ASK your doctor about these medications       acyclovir 400 MG tablet  Commonly known as:  ZOVIRAX  Take 400 mg by mouth daily.     aspirin 81 MG tablet  Take 81 mg by mouth daily.     Co-Enzyme Q-10 100 MG Caps  Take 300 mg by mouth daily.     cyanocobalamin 500 MCG tablet  Take 500 mcg by mouth daily.     desonide 0.05 % ointment  Commonly known as:  DESOWEN  Apply 1 application topically as needed.     fluticasone 50 MCG/ACT nasal spray  Commonly known as:  FLONASE  Place 2 sprays into the nose daily.     folic acid 1 MG tablet  Commonly known as:  FOLVITE  Take 1 mg by mouth daily.     LUCENTIS IO  Inject 1 each into the eye every 6 (six) weeks.     metoprolol succinate 25 MG 24 hr tablet  Commonly known as:  TOPROL-XL  Take 1 tablet (25 mg total) by mouth daily.     MUCINEX DM PO  Take 1 capsule by mouth daily.     NEXIUM 40 MG capsule  Generic drug:  esomeprazole  Take 40 mg by mouth daily before breakfast.     nitroGLYCERIN 0.4 MG SL tablet  Commonly known as:  NITROSTAT  Place 1 tablet (0.4 mg total) under the tongue every 5 (five) minutes as needed.     predniSONE 5 MG tablet  Commonly known as:  DELTASONE  Take 5 mg by mouth daily.     PRESERVISION/LUTEIN PO  Take 1 tablet by mouth 2 (two) times daily.     ranitidine 150 MG  tablet  Commonly known as:  ZANTAC  Take 1 tablet (150 mg total) by mouth at bedtime.     tamsulosin 0.4 MG Caps capsule  Commonly known as:  FLOMAX  Take 1 capsule (0.4 mg total) by mouth daily.     traMADol 50 MG tablet  Commonly known as:  ULTRAM  Take 1 tablet (50 mg total) by mouth every 8 (eight) hours as needed for pain.     triamcinolone 0.1 % cream : eucerin Crea  Apply 1 application topically as needed.       Allergies  Allergen Reactions  . Amoxicillin     REACTION: unspecified  . Doxycycline   . Erythromycin     REACTION: unspecified  . Penicillins     rash  . Sulfamethoxazole W-Trimethoprim     unknown      The results of significant  diagnostics from this hospitalization (including imaging, microbiology, ancillary and laboratory) are listed below for reference.    Significant Diagnostic Studies: Dg Chest 2 View  11/07/2012   *RADIOLOGY REPORT*  Clinical Data: Fever.  Dizziness.  Back pain.  CHEST - 2 VIEW  Comparison: 11/02/2012 the  Findings: Mild streaky opacity is seen in the posterior right lower lobe, which is new and suspicious for pneumonia. The left lung remains clear.  No evidence of pleural effusion.  Heart size is within normal limits.  No mass or lymphadenopathy identified.  IMPRESSION: New mild posterior right lower lobe opacity, suspicious for pneumonia. Post-treatment  radiographic followup recommended to confirm resolution.   Original Report Authenticated By: Myles Rosenthal, M.D.   Mr [facility] Wo Contrast  11/03/2012   *RADIOLOGY REPORT*  Clinical Data:  Pain in ears.  Asymmetric hearing loss.  BUN and creatinine were obtained on site at [facility] at [address] Results:  BUN 34 mg/dL,  Creatinine 1.6 mg/dL.  MRI BRAIN WITH AND WITHOUT CONTRAST MRA HEAD WITHOUT CONTRAST  Technique: Multiplanar, multiecho pulse sequences of the brain and surrounding structures were obtained according to standard protocol with and without intravenous  contrast.  Angiographic images of the Circle of Willis were obtained using MRA technique without intravenous contrast.  Contrast: 8 ml MultiHance.  Comparison11/18/2009 and 06/26/2006 brain MR.  MRI HEAD  Findings: Labyrinthine structures are normal and symmetric bilaterally.  No evidence of vestibular aqueduct dilation.  No internal auditory canal enhancing lesion.  New from prior examination and are a tangle of vessels in the region of the left transverse sinus, sigmoid sinus and jugular bulb suggestive of a dural fistula.  Partial opacification mastoid air cells greater on the right.  No obstructing lesion at the level of the posterior-superior nasopharynx. Probable 9 mm Thornwaldt cyst left paracentral position.  Limited evaluation of the ossicles by MR imaging.  No acute infarct.  No intracranial hemorrhage.  Global atrophy.  Ventricular prominence accentuated by a patent cavum septum pellucidum et vergae.  No hydrocephalus.  Prominent small vessel disease type changes.  Minimal mucosal thickening ethmoid sinus with minimal mucosal thickening paranasal sinuses.  No intracranial mass or abnormal enhancement.  Cervical medullary junction, pituitary region, pineal region and orbital structures unremarkable.  IMPRESSION:  Labyrinthine structures are normal and symmetric bilaterally.  No evidence of vestibular aqueduct dilation.  No internal auditory canal enhancing lesion.  New from prior examination and are a tangle of vessels in the region of the left transverse sinus, sigmoid sinus and jugular bulb suggestive of a dural fistula.  Partial opacification mastoid air cells greater on the right.  No obstructing lesion at the level of the posterior-superior nasopharynx. Probable 9 mm Thornwaldt cyst left paracentral position.  Limited evaluation of the ossicles by MR imaging.  No acute infarct.  Global atrophy.  Ventricular prominence accentuated by a patent cavum septum pellucidum et vergae.  No hydrocephalus.   Prominent small vessel disease type changes.  MRA HEAD  Findings:  Abnormal vessels in the region of the left sigmoid sinus, transverse sinus and jugular bulb suggestive of dural fistula.  Anterior and posterior circulation without medium or large size vessel significant stenosis or occlusion.  Mild branch vessel irregularity.  IMPRESSION: Abnormal vessels in the region of the left sigmoid sinus, transverse sinus and jugular bulb suggestive of dural fistula.  MRV HEAD WITHOUT CONTRAST  Technique:  Angiographic images of the intracranial venous structures were obtained using MRV technique without intravenous contrast.  Findings:  Abnormal vessels in the region of the left sigmoid sinus, transverse sinus and jugular bulb suggestive of dural fistula. This is causing incomplete filling and narrowing of the left transverse sinus/sigmoid sinus and jugular bulb.  Remainder of the major dural sinuses are patent.  IMPRESSION: Abnormal vessels in the region of the left sigmoid sinus, transverse sinus and jugular bulb suggestive of dural fistula. This is causing incomplete filling and narrowing of the left transverse sinus/sigmoid sinus and jugular bulb.  This has been made a PRA call report utilizing dashboard call feature.   Original Report Authenticated By: Lacy Duverney, M.D.   Mr [facility] Wo Contrast  11/03/2012   *RADIOLOGY REPORT*  Clinical Data:  Pain in ears.  Asymmetric hearing loss.  BUN and creatinine were obtained on site at [facility] at [address] Results:  BUN 34 mg/dL,  Creatinine 1.6 mg/dL.  MRI BRAIN WITH AND WITHOUT CONTRAST MRA HEAD WITHOUT CONTRAST  Technique: Multiplanar, multiecho pulse sequences of the brain and surrounding structures were obtained according to standard protocol with and without intravenous contrast.  Angiographic images of the Circle of Willis were obtained using MRA technique without intravenous contrast.  Contrast: 8 ml MultiHance.  Comparison11/18/2009 and  06/26/2006 brain MR.  MRI HEAD  Findings: Labyrinthine structures are normal and symmetric bilaterally.  No evidence of vestibular aqueduct dilation.  No internal auditory canal enhancing lesion.  New from prior examination and are a tangle of vessels in the region of the left transverse sinus, sigmoid sinus and jugular bulb suggestive of a dural fistula.  Partial opacification mastoid air cells greater on the right.  No obstructing lesion at the level of the posterior-superior nasopharynx. Probable 9 mm Thornwaldt cyst left paracentral position.  Limited evaluation of the ossicles by MR imaging.  No acute infarct.  No intracranial hemorrhage.  Global atrophy.  Ventricular prominence accentuated by a patent cavum septum pellucidum et vergae.  No hydrocephalus.  Prominent small vessel disease type changes.  Minimal mucosal thickening ethmoid sinus with minimal mucosal thickening paranasal sinuses.  No intracranial mass or abnormal enhancement.  Cervical medullary junction, pituitary region, pineal region and orbital structures unremarkable.  IMPRESSION:  Labyrinthine structures are normal and symmetric bilaterally.  No evidence of vestibular aqueduct dilation.  No internal auditory canal enhancing lesion.  New from prior examination and are a tangle of vessels in the region of the left transverse sinus, sigmoid sinus and jugular bulb suggestive of a dural fistula.  Partial opacification mastoid air cells greater on the right.  No obstructing lesion at the level of the posterior-superior nasopharynx. Probable 9 mm Thornwaldt cyst left paracentral position.  Limited evaluation of the ossicles by MR imaging.  No acute infarct.  Global atrophy.  Ventricular prominence accentuated by a patent cavum septum pellucidum et vergae.  No hydrocephalus.  Prominent small vessel disease type changes.  MRA HEAD  Findings:  Abnormal vessels in the region of the left sigmoid sinus, transverse sinus and jugular bulb suggestive of dural  fistula.  Anterior and posterior circulation without medium or large size vessel significant stenosis or occlusion.  Mild branch vessel irregularity.  IMPRESSION: Abnormal vessels in the region of the left sigmoid sinus, transverse sinus and jugular bulb suggestive of dural fistula.  MRV HEAD WITHOUT CONTRAST  Technique:  Angiographic images of the intracranial venous structures were obtained using MRV technique without intravenous contrast.  Findings:  Abnormal vessels in the region of the left sigmoid sinus, transverse sinus and jugular  bulb suggestive of dural fistula. This is causing incomplete filling and narrowing of the left transverse sinus/sigmoid sinus and jugular bulb.  Remainder of the major dural sinuses are patent.  IMPRESSION: Abnormal vessels in the region of the left sigmoid sinus, transverse sinus and jugular bulb suggestive of dural fistula. This is causing incomplete filling and narrowing of the left transverse sinus/sigmoid sinus and jugular bulb.  This has been made a PRA call report utilizing dashboard call feature.   Original Report Authenticated By: Lacy Duverney, M.D.   US Carotid Duplex Bilateral  11/01/2012   *RADIOLOGY REPORT*  Clinical Data: Right neck bruit  BILATERAL CAROTID DUPLEX ULTRASOUND  Technique: Wallace Cullens scale imaging, color Doppler and duplex ultrasound were performed of bilateral carotid and vertebral arteries in the neck.  Comparison:  None.  Criteria:  Quantification of carotid stenosis is based on velocity parameters that correlate the residual internal carotid diameter with NASCET-based stenosis levels, using the diameter of the distal internal carotid lumen as the denominator for stenosis measurement.  The following velocity measurements were obtained:                   PEAK SYSTOLIC/END DIASTOLIC RIGHT ICA:                        61cm/sec CCA:                        92cm/sec SYSTOLIC ICA/CCA RATIO:     0.66 DIASTOLIC ICA/CCA RATIO: ECA:                        90cm/sec   LEFT ICA:                        55cm/sec CCA:                        120cm/sec SYSTOLIC ICA/CCA RATIO:     0.46 DIASTOLIC ICA/CCA RATIO: ECA:                        96cm/sec  Findings:  RIGHT CAROTID ARTERY: Little if any plaque in the bulb.  Low resistance internal carotid Doppler pattern.  RIGHT VERTEBRAL ARTERY:  Antegrade.  LEFT CAROTID ARTERY: Little if any plaque in the bulb.  Low resistance internal carotid Doppler pattern.  LEFT VERTEBRAL ARTERY:  Antegrade.  IMPRESSION: Less than 50% stenosis in the right and left internal carotid arteries.   Original Report Authenticated By: Jolaine Click, M.D.   Dg Abd Acute W/chest  11/02/2012   *RADIOLOGY REPORT*  Clinical Data: Belching and abdominal pain  ACUTE ABDOMEN SERIES (ABDOMEN 2 VIEW & CHEST 1 VIEW)  Comparison: 12/01/2011  Findings: No evidence of obstruction or free air.  Mild increase stool is noted in the colon and rectum.  The bowel gas pattern is otherwise unremarkable.  There is been a cholecystectomy and pinning of a right femoral neck fracture, stable.  The included frontal radiograph the chest shows hyperexpanded lungs with some mild apical scarring but no acute findings.  No pleural effusion or pneumothorax.  The bony structures are demineralized with degenerative changes noted along the visualized spine.  IMPRESSION: No acute findings.  No obstruction or free air.  Mild increase stool in the colon.  Clinically significant discrepancy from primary report, if provided: None   Original Report Authenticated  By: Amie Portland, M.D.    Microbiology: Recent Results (from the past 240 hour(s))  URINE CULTURE     Status: None   Collection Time    11/07/12  5:02 PM      Result Value Range Status   Specimen Description URINE, CLEAN CATCH   Final   Special Requests NONE   Final   Culture  Setup Time     Final   Value: 11/07/2012 18:43     Performed at [facility] Count     Final   Value: 7,000 COLONIES/ML     Performed at  [facility]   Culture     Final   Value: INSIGNIFICANT GROWTH     Performed at [facility]   Report Status 11/08/2012 FINAL   Final  CULTURE, BLOOD (ROUTINE X 2)     Status: None   Collection Time    11/07/12  5:30 PM      Result Value Range Status   Specimen Description BLOOD   Final   Special Requests BOTTLES DRAWN AEROBIC ONLY 6CC SITE NOT INDICATED   Final   Culture  Setup Time     Final   Value: 11/08/2012 11:46     Performed at [facility]   Culture     Final   Value:        BLOOD CULTURE RECEIVED NO GROWTH TO DATE CULTURE WILL BE HELD FOR 5 DAYS BEFORE ISSUING A FINAL NEGATIVE REPORT     Performed at [facility]   Report Status PENDING   Incomplete  CULTURE, BLOOD (ROUTINE X 2)     Status: None   Collection Time    11/07/12  6:40 PM      Result Value Range Status   Specimen Description BLOOD ARM LEFT   Final   Special Requests BOTTLES DRAWN AEROBIC ONLY 10CC   Final   Culture  Setup Time     Final   Value: 11/08/2012 03:14     Performed at [facility]   Culture     Final   Value:        BLOOD CULTURE RECEIVED NO GROWTH TO DATE CULTURE WILL BE HELD FOR 5 DAYS BEFORE ISSUING A FINAL NEGATIVE REPORT     Performed at [facility]   Report Status PENDING   Incomplete  CULTURE, BLOOD (ROUTINE X 2)     Status: None   Collection Time    11/07/12  6:50 PM      Result Value Range Status   Specimen Description BLOOD HAND LEFT   Final   Special Requests BOTTLES DRAWN AEROBIC ONLY 5CC   Final   Culture  Setup Time     Final   Value: 11/08/2012 03:15     Performed at [facility]   Culture     Final   Value:        BLOOD CULTURE RECEIVED NO GROWTH TO DATE CULTURE WILL BE HELD FOR 5 DAYS BEFORE ISSUING A FINAL NEGATIVE REPORT     Performed at [facility]   Report Status PENDING   Incomplete     Labs: Basic Metabolic Panel:  Recent Labs Lab 11/07/12 1432 11/07/12 1655 11/07/12 2215 11/08/12 0400  11/10/12 0555  NA 136 136  --  137 139  K 4.1 4.0  --  4.1 3.8  CL 99 99  --  101 104  CO2 29 29  --  27 26  GLUCOSE 122* 105*  --  111* 102*  BUN 20 20  --  19 20  CREATININE 1.33 1.33 1.24 1.31 1.42*  CALCIUM 9.4 9.2  --  9.1 9.0   Liver Function Tests:  Recent Labs Lab 11/07/12 1432 11/07/12 1655  AST 15 14  ALT 14 12  ALKPHOS 69 70  BILITOT 1.0 1.0  PROT 7.0 6.9  ALBUMIN 3.3* 3.3*    Recent Labs Lab 11/07/12 1655  LIPASE 31   No results found for this basename: AMMONIA,  in the last 168 hours CBC:  Recent Labs Lab 11/07/12 1432 11/07/12 1655 11/07/12 2215 11/08/12 0400 11/10/12 0555  WBC 10.9* 11.1* 9.6 11.4* 6.3  NEUTROABS 8.5* 8.8*  --   --  4.6  HGB 14.4 14.0 12.8* 13.7 11.5*  HCT 41.1 40.0 36.9* 39.5 34.5*  MCV 90.7 90.3 90.7 91.2 90.3  PLT 124* 116* 105* 119* 142*   Cardiac Enzymes: No results found for this basename: CKTOTAL, CKMB, CKMBINDEX, TROPONINI,  in the last 168 hours BNP: BNP (last 3 results) No results found for this basename: PROBNP,  in the last 8760 hours CBG: No results found for this basename: GLUCAP,  in the last 168 hours     Signed:  Carolyne Littles, J  [facility] 11/11/2012, 10:46 AM

## 2012-11-13 ENCOUNTER — Other Ambulatory Visit: Payer: Self-pay | Admitting: Radiology

## 2012-11-13 ENCOUNTER — Telehealth: Payer: Self-pay | Admitting: Internal Medicine

## 2012-11-13 ENCOUNTER — Other Ambulatory Visit (HOSPITAL_COMMUNITY): Payer: Self-pay | Admitting: Interventional Radiology

## 2012-11-13 DIAGNOSIS — I671 Cerebral aneurysm, nonruptured: Secondary | ICD-10-CM

## 2012-11-13 NOTE — Telephone Encounter (Signed)
Spoke to pt's wife [name] she wanted to let Dr. Kirtland Bouchard know pt was in hospital with pneumonia was discharged and is scheduled for surgery on Thurs. [name] I will let Dr. Kirtland Bouchard know and that the information is in his chart about hospital admission and upcoming surgery. [name] verbalized understanding. Dr. Amador Cunas notified about pt.

## 2012-11-13 NOTE — Telephone Encounter (Signed)
PT wife states that she needs to speak with you in regards to the patients hospital discharge. These where the only details that she would disclose. Please assist.

## 2012-11-14 ENCOUNTER — Telehealth: Payer: Self-pay | Admitting: Internal Medicine

## 2012-11-14 ENCOUNTER — Other Ambulatory Visit: Payer: Self-pay | Admitting: Radiology

## 2012-11-14 LAB — CULTURE, BLOOD (ROUTINE X 2)
Culture: NO GROWTH
Culture: NO GROWTH

## 2012-11-14 MED ORDER — PANTOPRAZOLE SODIUM 40 MG PO TBEC: 40.0000 mg | DELAYED_RELEASE_TABLET | Freq: Every day | ORAL | Status: DC

## 2012-11-14 NOTE — Telephone Encounter (Signed)
ok 

## 2012-11-14 NOTE — Telephone Encounter (Signed)
Pt notified Rx was sent to pharmacy. 

## 2012-11-14 NOTE — Telephone Encounter (Signed)
Please advise if okay to change to Protonix from Nexium?

## 2012-11-14 NOTE — Telephone Encounter (Signed)
Caller: Jaeceon/Patient; Phone: [phone]; Reason for Call: Pt requesing to be switched from Nexium to Protonix, states while he was hospitalized on 8-12 he was given Protonix instead of Nexium and his symptoms of, abdominal pain and coughing after eating, improved.  Pt would like to ask Dr Amador Cunas if approves the switch, if so, Pt would like RX sent to [facility], [place], info in [place]

## 2012-11-15 ENCOUNTER — Ambulatory Visit (HOSPITAL_COMMUNITY)
Admission: RE | Admit: 2012-11-15 | Discharge: 2012-11-15 | Disposition: A | Discharge status: Home / Self Care | Payer: Medicare Other | Source: Ambulatory Visit | Attending: Anesthesiology | Admitting: Anesthesiology

## 2012-11-15 ENCOUNTER — Encounter (HOSPITAL_COMMUNITY)
Admission: RE | Admit: 2012-11-15 | Discharge: 2012-11-15 | Disposition: A | Discharge status: Home / Self Care | Payer: Medicare Other | Source: Ambulatory Visit | Attending: Interventional Radiology | Admitting: Interventional Radiology

## 2012-11-15 DIAGNOSIS — I10 Essential (primary) hypertension: Secondary | ICD-10-CM | POA: Diagnosis present

## 2012-11-15 DIAGNOSIS — M353 Polymyalgia rheumatica: Secondary | ICD-10-CM | POA: Diagnosis present

## 2012-11-15 DIAGNOSIS — I671 Cerebral aneurysm, nonruptured: Secondary | ICD-10-CM | POA: Diagnosis not present

## 2012-11-15 DIAGNOSIS — M129 Arthropathy, unspecified: Secondary | ICD-10-CM | POA: Diagnosis present

## 2012-11-15 DIAGNOSIS — H9319 Tinnitus, unspecified ear: Secondary | ICD-10-CM | POA: Diagnosis not present

## 2012-11-15 DIAGNOSIS — I2510 Atherosclerotic heart disease of native coronary artery without angina pectoris: Secondary | ICD-10-CM | POA: Insufficient documentation

## 2012-11-15 DIAGNOSIS — I770 Arteriovenous fistula, acquired: Secondary | ICD-10-CM | POA: Diagnosis not present

## 2012-11-15 DIAGNOSIS — Z01812 Encounter for preprocedural laboratory examination: Secondary | ICD-10-CM | POA: Insufficient documentation

## 2012-11-15 DIAGNOSIS — Z01818 Encounter for other preprocedural examination: Secondary | ICD-10-CM | POA: Insufficient documentation

## 2012-11-15 DIAGNOSIS — F329 Major depressive disorder, single episode, unspecified: Secondary | ICD-10-CM | POA: Diagnosis present

## 2012-11-15 DIAGNOSIS — M069 Rheumatoid arthritis, unspecified: Secondary | ICD-10-CM | POA: Diagnosis not present

## 2012-11-15 DIAGNOSIS — Z8673 Personal history of transient ischemic attack (TIA), and cerebral infarction without residual deficits: Secondary | ICD-10-CM | POA: Diagnosis not present

## 2012-11-15 DIAGNOSIS — K219 Gastro-esophageal reflux disease without esophagitis: Secondary | ICD-10-CM | POA: Diagnosis present

## 2012-11-15 DIAGNOSIS — K589 Irritable bowel syndrome without diarrhea: Secondary | ICD-10-CM | POA: Diagnosis present

## 2012-11-15 DIAGNOSIS — Z9861 Coronary angioplasty status: Secondary | ICD-10-CM | POA: Diagnosis not present

## 2012-11-15 DIAGNOSIS — J90 Pleural effusion, not elsewhere classified: Secondary | ICD-10-CM | POA: Insufficient documentation

## 2012-11-15 DIAGNOSIS — E785 Hyperlipidemia, unspecified: Secondary | ICD-10-CM | POA: Diagnosis not present

## 2012-11-15 DIAGNOSIS — F411 Generalized anxiety disorder: Secondary | ICD-10-CM | POA: Diagnosis present

## 2012-11-15 LAB — CBC WITH DIFFERENTIAL/PLATELET
Eosinophils Absolute: 0.1 10*3/uL (ref 0.0–0.7)
Eosinophils Relative: 1 % (ref 0–5)
HCT: 39.4 % (ref 39.0–52.0)
Hemoglobin: 13.5 g/dL (ref 13.0–17.0)
Lymphocytes Relative: 16 % (ref 12–46)
Lymphs Abs: 1.2 10*3/uL (ref 0.7–4.0)
MCH: 31 pg (ref 26.0–34.0)
MCV: 90.4 fL (ref 78.0–100.0)
Monocytes Relative: 7 % (ref 3–12)
Platelets: 241 10*3/uL (ref 150–400)
RBC: 4.36 MIL/uL (ref 4.22–5.81)
WBC: 7.1 10*3/uL (ref 4.0–10.5)

## 2012-11-15 LAB — COMPREHENSIVE METABOLIC PANEL
ALT: 28 U/L (ref 0–53)
Alkaline Phosphatase: 80 U/L (ref 39–117)
BUN: 29 mg/dL — ABNORMAL HIGH (ref 6–23)
CO2: 27 mEq/L (ref 19–32)
Calcium: 9.6 mg/dL (ref 8.4–10.5)
GFR calc Af Amer: 52 mL/min — ABNORMAL LOW (ref >90)
GFR calc non Af Amer: 45 mL/min — ABNORMAL LOW (ref >90)
Glucose, Bld: 116 mg/dL — ABNORMAL HIGH (ref 70–99)
Sodium: 142 mEq/L (ref 135–145)
Total Protein: 6.5 g/dL (ref 6.0–8.3)

## 2012-11-15 LAB — PROTIME-INR: Prothrombin Time: 13.2 seconds (ref 11.6–15.2)

## 2012-11-15 NOTE — Progress Notes (Signed)
Anesthesia PAT Evaluation:  Patient is a 79 year old male posted for of ECA sigmoid sinus, transverse sinus fistula repair by Dr. Corliss Skains on 11/16/12 under [place].   He was recently admitted to [facility] from 11/07/12-11/11/12 for community acquired pneumonia (completed Levaquin 11/14/12).  Apparently, he had recently begun a work-up for pulsatile right tinnitus, vertigo, and headaches so neurology (Dr. Wyatt Portela) was consulted to see patient during his hospitalization. Cerebral arteriogram was performed on 11/10/12 by Dr. Corliss Skains showing a fast flow lt ECA sigmoid, transverse sinus fistula with no aneurysms seen and repair was recommended.   Other history includes CAD s/p DES LAD 04/2004 , shingles ~ 2 months ago, polymyalgia rheumatica, PUD, hypothyroidism, former smoker, anxiety, depression, HTN, TIA, GERD, IBS, arthritis.  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 PCP is Dr. Amador Cunas.  Cardiologist is Dr. Antoine Poche, last visit 05/22/12 and no further testing was recommended at that time.  MRA of the head on 11/03/12 showed:  Abnormal vessels in the region of the left sigmoid sinus, transverse sinus and jugular bulb suggestive of dural fistula. This is causing incomplete filling and narrowing of the left transverse sinus/sigmoid sinus and jugular bulb.   EKG on 11/02/12 showed NSR.  Cardiac cath on 05/05/04 showed: Coronary arteriography (co-dominant): Left main has a distal 20% stenosis. Left anterior descending artery has a tubular 75% stenosis in the proximal  Vessel (s/p PTCA/DES). The mid vessel has a diffuse 30% stenosis. The distal vessel has a diffuse 20% stenosis. The LAD gives rise to three small diagonal branches. Left circumflex a large, co-dominant vessel. There is a 20% stenosis in the ostium of the circumflex. This vessel is otherwise normal, giving rise to a small first obtuse marginal, a normal-sized second obtuse marginal and two normal-sized posterolateral branches. Right coronary artery is a small,  co-dominant vessel, ending as a small posterior descending artery. The right coronary artery is normal. EF 60%. No mitral regurgitation.  CXR on 11/07/12 showed new mild posterior RLL opacity, suspicious for pneumonia.  Post-treatment radiographic follow-up recommended to confirm resolution.  Follow-up CXR today showed: Interval resolved right lung airspace disease but small pleural effusions are present. Probable right nipple shadow.   Labs from 11/10/12 and 11/15/12 noted.  Patient reports that he feels well from a cardiopulmonary standpoint.  He denies fever for > 1 week, SOB, chest pain, edema.  He is not particularly active.  He denies any new CV symptoms since his last cardiology visit.  O2 sats on RA are 96.  He appears well, without conversational dyspnea.  Lungs are clear throughout.  Heart RRR, no significant murmur auscultated. No LE edema noted.    I did review above including patient's last two chest xrays with anesthesiologist Dr. Sampson Goon.  Lungs sounds and O2 sats are good.  Since opacity has resolved and clinically patient appears well following treatment of his early PNA then it is anticipated that he can proceed as planned.    [facility]/Anesthesiology Phone [phone] 11/15/2012 5:25 PM

## 2012-11-15 NOTE — Pre-Procedure Instructions (Signed)
Verbon Giangregorio Pocock  11/15/2012   Your procedure is scheduled on:  November 16, 2012  [facility] at 8:15 AM.  Call this number if you have problems the morning of surgery: [phone]   Remember:   Do not eat food or drink liquids after midnight.   Take these medicines the morning of surgery with A SIP OF WATER: metoprolol succinate (TOPROL-XL) 25 MG 24 hr tablet, NEXIUM  40 MG capsule, pantoprazole (PROTONIX) 40 MG tablet, tamsulosin (FLOMAX) 0.4 MG CAPS capsule, traMADol (ULTRAM) 50 MG  tablet as needed   Do not wear jewelry, make-up or nail polish.  Do not wear lotions, powders, or perfumes. You may wear deodorant.  Do not shave 48 hours prior to surgery. Men may shave face and neck.  Do not bring valuables to the hospital.  [facility] is not responsible for any belongings or valuables.  Contacts, dentures or bridgework may not be worn into surgery.  Leave suitcase in the car. After surgery it may be brought to your room.  For patients admitted to the hospital, checkout time is 11:00 AM the day of  discharge.   Patients discharged the day of surgery will not be allowed to drive  home.  Name and phone number of your driver:   Special Instructions: Shower using CHG 2 nights before surgery and the night before surgery.  If you shower the day of surgery use CHG.  Use special wash - you have one bottle of CHG for all showers.  You should use approximately 1/3 of the bottle for each shower.   Please read over the following fact sheets that you were given: Pain Booklet, Coughing and Deep Breathing and Surgical Site Infection Prevention

## 2012-11-15 NOTE — Progress Notes (Signed)
This pt scored at an elevated risk for obstructive sleep apnea using the STOP bang tool during a pre surgical testing.

## 2012-11-16 ENCOUNTER — Encounter (HOSPITAL_COMMUNITY): Payer: Self-pay

## 2012-11-16 ENCOUNTER — Ambulatory Visit (HOSPITAL_COMMUNITY)
Admission: RE | Admit: 2012-11-16 | Discharge: 2012-11-16 | Disposition: A | Discharge status: Home / Self Care | Payer: Medicare Other | Source: Ambulatory Visit | Attending: Interventional Radiology | Admitting: Interventional Radiology

## 2012-11-16 ENCOUNTER — Ambulatory Visit (HOSPITAL_COMMUNITY): Payer: Medicare Other | Admitting: Critical Care Medicine

## 2012-11-16 ENCOUNTER — Encounter (HOSPITAL_COMMUNITY)
Admission: RE | Disposition: A | Discharge status: Home / Self Care | Payer: Self-pay | Source: Ambulatory Visit | Attending: Interventional Radiology

## 2012-11-16 ENCOUNTER — Inpatient Hospital Stay (HOSPITAL_COMMUNITY)
Admission: RE | Admit: 2012-11-16 | Discharge: 2012-11-17 | DRG: 238 | Disposition: A | Discharge status: Home / Self Care | Payer: Medicare Other | Source: Ambulatory Visit | Attending: Interventional Radiology | Admitting: Interventional Radiology

## 2012-11-16 ENCOUNTER — Encounter (HOSPITAL_COMMUNITY): Payer: Self-pay | Admitting: Vascular Surgery

## 2012-11-16 ENCOUNTER — Encounter (HOSPITAL_COMMUNITY): Payer: Self-pay | Admitting: *Deleted

## 2012-11-16 DIAGNOSIS — Z860100 Personal history of colon polyps, unspecified: Secondary | ICD-10-CM

## 2012-11-16 DIAGNOSIS — I10 Essential (primary) hypertension: Secondary | ICD-10-CM | POA: Diagnosis present

## 2012-11-16 DIAGNOSIS — I671 Cerebral aneurysm, nonruptured: Secondary | ICD-10-CM | POA: Diagnosis not present

## 2012-11-16 DIAGNOSIS — M353 Polymyalgia rheumatica: Secondary | ICD-10-CM

## 2012-11-16 DIAGNOSIS — R413 Other amnesia: Secondary | ICD-10-CM

## 2012-11-16 DIAGNOSIS — R05 Cough: Secondary | ICD-10-CM

## 2012-11-16 DIAGNOSIS — F341 Dysthymic disorder: Secondary | ICD-10-CM

## 2012-11-16 DIAGNOSIS — L988 Other specified disorders of the skin and subcutaneous tissue: Secondary | ICD-10-CM

## 2012-11-16 DIAGNOSIS — M26629 Arthralgia of temporomandibular joint, unspecified side: Secondary | ICD-10-CM

## 2012-11-16 DIAGNOSIS — I2510 Atherosclerotic heart disease of native coronary artery without angina pectoris: Secondary | ICD-10-CM

## 2012-11-16 DIAGNOSIS — Z8679 Personal history of other diseases of the circulatory system: Secondary | ICD-10-CM

## 2012-11-16 DIAGNOSIS — M479 Spondylosis, unspecified: Secondary | ICD-10-CM

## 2012-11-16 DIAGNOSIS — H3530 Unspecified macular degeneration: Secondary | ICD-10-CM

## 2012-11-16 DIAGNOSIS — K589 Irritable bowel syndrome without diarrhea: Secondary | ICD-10-CM | POA: Diagnosis present

## 2012-11-16 DIAGNOSIS — R972 Elevated prostate specific antigen [PSA]: Secondary | ICD-10-CM

## 2012-11-16 DIAGNOSIS — H9319 Tinnitus, unspecified ear: Secondary | ICD-10-CM | POA: Diagnosis not present

## 2012-11-16 DIAGNOSIS — Z9861 Coronary angioplasty status: Secondary | ICD-10-CM

## 2012-11-16 DIAGNOSIS — I770 Arteriovenous fistula, acquired: Secondary | ICD-10-CM | POA: Diagnosis not present

## 2012-11-16 DIAGNOSIS — B029 Zoster without complications: Secondary | ICD-10-CM

## 2012-11-16 DIAGNOSIS — F411 Generalized anxiety disorder: Secondary | ICD-10-CM | POA: Diagnosis present

## 2012-11-16 DIAGNOSIS — M069 Rheumatoid arthritis, unspecified: Secondary | ICD-10-CM | POA: Diagnosis not present

## 2012-11-16 DIAGNOSIS — J189 Pneumonia, unspecified organism: Secondary | ICD-10-CM

## 2012-11-16 DIAGNOSIS — F3289 Other specified depressive episodes: Secondary | ICD-10-CM | POA: Diagnosis present

## 2012-11-16 DIAGNOSIS — F329 Major depressive disorder, single episode, unspecified: Secondary | ICD-10-CM | POA: Diagnosis present

## 2012-11-16 DIAGNOSIS — R030 Elevated blood-pressure reading, without diagnosis of hypertension: Secondary | ICD-10-CM

## 2012-11-16 DIAGNOSIS — M129 Arthropathy, unspecified: Secondary | ICD-10-CM | POA: Diagnosis present

## 2012-11-16 DIAGNOSIS — E538 Deficiency of other specified B group vitamins: Secondary | ICD-10-CM

## 2012-11-16 DIAGNOSIS — D649 Anemia, unspecified: Secondary | ICD-10-CM

## 2012-11-16 DIAGNOSIS — K219 Gastro-esophageal reflux disease without esophagitis: Secondary | ICD-10-CM

## 2012-11-16 DIAGNOSIS — E785 Hyperlipidemia, unspecified: Secondary | ICD-10-CM | POA: Diagnosis not present

## 2012-11-16 DIAGNOSIS — R059 Cough, unspecified: Secondary | ICD-10-CM

## 2012-11-16 DIAGNOSIS — L719 Rosacea, unspecified: Secondary | ICD-10-CM

## 2012-11-16 DIAGNOSIS — Z8673 Personal history of transient ischemic attack (TIA), and cerebral infarction without residual deficits: Secondary | ICD-10-CM

## 2012-11-16 DIAGNOSIS — K279 Peptic ulcer, site unspecified, unspecified as acute or chronic, without hemorrhage or perforation: Secondary | ICD-10-CM

## 2012-11-16 DIAGNOSIS — R1310 Dysphagia, unspecified: Secondary | ICD-10-CM

## 2012-11-16 DIAGNOSIS — E039 Hypothyroidism, unspecified: Secondary | ICD-10-CM

## 2012-11-16 DIAGNOSIS — K5730 Diverticulosis of large intestine without perforation or abscess without bleeding: Secondary | ICD-10-CM

## 2012-11-16 HISTORY — PX: RADIOLOGY WITH ANESTHESIA: SHX6223

## 2012-11-16 LAB — POCT ACTIVATED CLOTTING TIME
Activated Clotting Time: 165 s
Activated Clotting Time: 181 s
Activated Clotting Time: 176 seconds

## 2012-11-16 SURGERY — RADIOLOGY WITH ANESTHESIA
Anesthesia: General

## 2012-11-16 MED ORDER — ROCURONIUM BROMIDE 100 MG/10ML IV SOLN
INTRAVENOUS | Status: DC | PRN
  Administered 2012-11-16: 10 mg via INTRAVENOUS
  Administered 2012-11-16: 40 mg via INTRAVENOUS
  Administered 2012-11-16 (×2): 10 mg via INTRAVENOUS

## 2012-11-16 MED ORDER — ONDANSETRON HCL 4 MG/2ML IJ SOLN
INTRAMUSCULAR | Status: DC | PRN
  Administered 2012-11-16: 4 mg via INTRAVENOUS

## 2012-11-16 MED ORDER — PROPOFOL 10 MG/ML IV BOLUS
INTRAVENOUS | Status: DC | PRN
  Administered 2012-11-16: 30 mg via INTRAVENOUS
  Administered 2012-11-16: 20 mg via INTRAVENOUS
  Administered 2012-11-16: 150 mg via INTRAVENOUS

## 2012-11-16 MED ORDER — ASPIRIN EC 325 MG PO TBEC
325.0000 mg | DELAYED_RELEASE_TABLET | Freq: Once | ORAL | Status: AC
  Administered 2012-11-16: 325 mg via ORAL
  Filled 2012-11-16: qty 1

## 2012-11-16 MED ORDER — NICARDIPINE HCL IN NACL 20-0.86 MG/200ML-% IV SOLN
5.0000 mg/h | INTRAVENOUS | Status: DC
  Filled 2012-11-16: qty 200

## 2012-11-16 MED ORDER — NEOSTIGMINE METHYLSULFATE 1 MG/ML IJ SOLN
INTRAMUSCULAR | Status: DC | PRN
  Administered 2012-11-16: 4 mg via INTRAVENOUS

## 2012-11-16 MED ORDER — ACETAMINOPHEN 500 MG PO TABS
1000.0000 mg | ORAL_TABLET | Freq: Four times a day (QID) | ORAL | Status: DC | PRN
  Administered 2012-11-16: 1000 mg via ORAL
  Filled 2012-11-16: qty 2

## 2012-11-16 MED ORDER — PHENYLEPHRINE HCL 10 MG/ML IJ SOLN
INTRAMUSCULAR | Status: DC | PRN
  Administered 2012-11-16 (×3): 40 ug via INTRAVENOUS
  Administered 2012-11-16: 80 ug via INTRAVENOUS

## 2012-11-16 MED ORDER — VANCOMYCIN HCL IN DEXTROSE 1-5 GM/200ML-% IV SOLN
1000.0000 mg | Freq: Once | INTRAVENOUS | Status: AC
  Administered 2012-11-16: 1000 mg via INTRAVENOUS
  Filled 2012-11-16: qty 200

## 2012-11-16 MED ORDER — FENTANYL CITRATE 0.05 MG/ML IJ SOLN: 25.0000 ug | INTRAMUSCULAR | Status: DC | PRN

## 2012-11-16 MED ORDER — VANCOMYCIN HCL 1000 MG IV SOLR
1000.0000 mg | Freq: Once | INTRAVENOUS | Status: DC
  Filled 2012-11-16: qty 1000

## 2012-11-16 MED ORDER — GLYCOPYRROLATE 0.2 MG/ML IJ SOLN
INTRAMUSCULAR | Status: DC | PRN
  Administered 2012-11-16: .8 mg via INTRAVENOUS
  Administered 2012-11-16: .2 mg via INTRAVENOUS

## 2012-11-16 MED ORDER — ARTIFICIAL TEARS OP OINT
TOPICAL_OINTMENT | OPHTHALMIC | Status: DC | PRN
  Administered 2012-11-16: 1 via OPHTHALMIC

## 2012-11-16 MED ORDER — SODIUM CHLORIDE 0.9 % IV SOLN: Freq: Once | INTRAVENOUS | Status: DC

## 2012-11-16 MED ORDER — SODIUM CHLORIDE 0.9 % IV SOLN
INTRAVENOUS | Status: DC
  Administered 2012-11-16: 16:00:00 via INTRAVENOUS

## 2012-11-16 MED ORDER — LACTATED RINGERS IV SOLN
INTRAVENOUS | Status: DC
  Administered 2012-11-16: 07:00:00 via INTRAVENOUS

## 2012-11-16 MED ORDER — ACETAMINOPHEN 650 MG RE SUPP: 650.0000 mg | Freq: Four times a day (QID) | RECTAL | Status: DC | PRN

## 2012-11-16 MED ORDER — FENTANYL CITRATE 0.05 MG/ML IJ SOLN
INTRAMUSCULAR | Status: DC | PRN
  Administered 2012-11-16 (×3): 50 ug via INTRAVENOUS
  Administered 2012-11-16: 25 ug via INTRAVENOUS

## 2012-11-16 MED ORDER — SODIUM CHLORIDE 0.9 % IV SOLN
10.0000 mg | INTRAVENOUS | Status: DC | PRN
  Administered 2012-11-16: 25 ug/min via INTRAVENOUS
  Administered 2012-11-16: 50 ug/min via INTRAVENOUS

## 2012-11-16 MED ORDER — LABETALOL HCL 5 MG/ML IV SOLN
INTRAVENOUS | Status: DC | PRN
  Administered 2012-11-16: 15 mg via INTRAVENOUS
  Administered 2012-11-16: 5 mg via INTRAVENOUS

## 2012-11-16 MED ORDER — LACTATED RINGERS IV SOLN
INTRAVENOUS | Status: DC | PRN
  Administered 2012-11-16 (×2): via INTRAVENOUS

## 2012-11-16 MED ORDER — LIDOCAINE HCL (CARDIAC) 20 MG/ML IV SOLN
INTRAVENOUS | Status: DC | PRN
  Administered 2012-11-16: 50 mg via INTRAVENOUS

## 2012-11-16 MED ORDER — IOHEXOL 300 MG/ML  SOLN
150.0000 mL | Freq: Once | INTRAMUSCULAR | Status: AC | PRN
  Administered 2012-11-16: 1 mL via INTRAVENOUS

## 2012-11-16 MED ORDER — HEPARIN SODIUM (PORCINE) 1000 UNIT/ML IJ SOLN
INTRAMUSCULAR | Status: DC | PRN
  Administered 2012-11-16: 3000 [IU] via INTRAVENOUS
  Administered 2012-11-16 (×3): 500 [IU] via INTRAVENOUS

## 2012-11-16 MED ORDER — NITROGLYCERIN 1 MG/10 ML FOR IR/CATH LAB
INTRA_ARTERIAL | Status: AC
  Filled 2012-11-16: qty 10

## 2012-11-16 MED ORDER — ONDANSETRON HCL 4 MG/2ML IJ SOLN: 4.0000 mg | Freq: Four times a day (QID) | INTRAMUSCULAR | Status: DC | PRN

## 2012-11-16 NOTE — Progress Notes (Signed)
  Subjective: Post procedure. Denies any N/V,headaches visual ,motor or sensory or speech difficulties. Denies  any vertigo or obvious pulsatile tinnitus. Denies any chest pains or SOB.  Objective: Vital signs in last 24 hours: Temp:  [97 F (36.1 C)-98.3 F (36.8 C)] 97.5 F (36.4 C) (08/21 1455) Pulse Rate:  [58-76] 62 (08/21 1530) Resp:  [9-22] 9 (08/21 1530) BP: (131-172)/(64-73) 137/64 mmHg (08/21 1530) SpO2:  [99 %-100 %] 100 % (08/21 1530) Arterial Line BP: (167-205)/(59-76) 186/59 mmHg (08/21 1530) Weight:  [170 lb 10.2 oz (77.4 kg)] 170 lb 10.2 oz (77.4 kg) (08/21 1455)    Intake/Output from previous day:   Intake/Output this shift: Total I/O In: -  Out: 2200 [Urine:2100; Blood:100]  On examination. Alert, awake oriented to time ,place and space.Marland Kitchen  Speech and comprehension clear.Marland Kitchen  PEARLA..3mm Rt = Lt  EOMS full..  No nystagmus  Visual Fields. Full to confrontation  No Facial asymmetry.  Tongue Midline..  Motor..           NO drift of outstretched arms..          Power.5/5 Proximally and distally all four extremities..  Fine motor and coordination to finger to nose equal..  Gait . Not tested  Romberg. Not tested  Heel to toe. .Not tested.  RT groin soft.Pulses intact   PT/INR  Recent Labs  11/15/12 1552  LABPROT 13.2  INR 1.02   ABG No results found for this basename: PHART, PCO2, PO2, HCO3,  in the last 72 hours  Studies/Results: Dg Chest 2 View  11/15/2012   CLINICAL DATA:  79 year old male preoperative study. Recent pneumonia.  EXAM: CHEST  2 VIEW  COMPARISON:  11/07/2012 and earlier.  FINDINGS: Chronic large lung volumes. Interval resolved focal right lung base airspace opacity, but there are small bilateral pleural effusions. No pneumothorax or pulmonary edema. Probable right nipple shadow. No other new pulmonary opacity. Stable cardiac size and mediastinal contours. No acute osseous abnormality identified.  IMPRESSION:  Interval resolved right lung airspace disease but small pleural effusions are present.  Probable right nipple shadow.   Electronically Signed   By: [name]   On: 11/15/2012 16:27    Anti-infectives: Anti-infectives   None      Assessment/Plan: s/p .LT DAVF staged embolism using coils and ONYX 18 . Plan.Marland Kitchen 1The current medical regimen is effective;  continue present plan and medications. Close neuro obs and BP monitoring with in the parameters. 3.Advance to clear liquid diet as tolerated. 4.D/C arterial line. D/W patient and spouse. Waylon Koffler K 11/16/2012

## 2012-11-16 NOTE — Anesthesia Postprocedure Evaluation (Signed)
Anesthesia Post Note  Patient: [name]  Procedure(s) Performed: Procedure(s) (LRB): EMBOLIZATION  OF FISTULA (N/A)  Anesthesia type: general  Patient location: PACU  Post pain: Pain level controlled  Post assessment: Patient's Cardiovascular Status Stable  Last Vitals:  Filed Vitals:   11/16/12 1437  BP: 136/71  Pulse: 62  Temp:   Resp: 11    Post vital signs: Reviewed and stable  Level of consciousness: sedated  Complications: No apparent anesthesia complications

## 2012-11-16 NOTE — Procedures (Signed)
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

## 2012-11-16 NOTE — Progress Notes (Signed)
Nursing 1700 Patient c/o left neck pain, rating 1/10 and described as dull.  Patient reports that he has had this pain before and thinks it may be related to his ear. Patient repositioned and will reassess. Dr. Loni Beckwith made aware of this pain, no new orders at this time.

## 2012-11-16 NOTE — H&P (Signed)
[name] is an 79 y.o. male.   Chief Complaint: Hx tinnitus Rt ear x months Memory difficulty; some dizziness; slight imbalance occasionally Cerebral arteriogram 11/10/2012 revealed L ECA sigmoid sinus; transverse sinus fistula Was scheduled for embolization last week but pt developed pneumonia  as an OP and was hospitalized for few days and discharged 8/16. Pt feeling great now; afeb; no sxs of pna Still with R tinnitus especially while lying flat HPI: IBS; diverticulitis; GERD; elevated PSA; TIA hx; PUD; HLD; CAD; HTN; Polymyalgia Rheumatica  Past Medical History  Diagnosis Date  . IBS (irritable bowel syndrome)   . Hemorrhoids   . Diverticulosis   . GERD (gastroesophageal reflux disease)   . Tubular adenoma of colon 07/1998  . Anxiety and depression   . Elevated prostate specific antigen (PSA)   . Vitamin B12 deficiency   . UTI (lower urinary tract infection)   . Arthritis   . Sciatica   . Anemia   . TIA (transient ischemic attack)   . Peptic ulcer disease   . Hyperlipidemia   . CAD (coronary artery disease)     PCI 2006, Stress Perfusion Study 2011  . Hypertension   . Rheumatoid arthritis(714.0)   . Polymyalgia     Past Surgical History  Procedure Laterality Date  . Ptca  2006    Stenting of proximal LAD w/ 75 % stenosis reduced to 0%  . Cataract extraction    . Cholecystectomy  2001  . Hernia repair    . Hip fracture surgery  03/2007    Right  . Salivary stone removal  11/2007    Family History  Problem Relation Age of Onset  . Colon cancer Maternal Uncle 50    ish  . Coronary artery disease Father    Social History:  reports that he quit smoking about 40 years ago. His smoking use included Cigarettes. He smoked 0.00 packs per day. He has never used smokeless tobacco. He reports that he does not drink alcohol or use illicit drugs.  Allergies:  Allergies  Allergen Reactions  . Amoxicillin     REACTION: unspecified  . Doxycycline   . Erythromycin    REACTION: unspecified  . Penicillins     rash  . Sulfamethoxazole W-Trimethoprim     unknown     (Not in a hospital admission)  Results for orders placed during the hospital encounter of 11/15/12 (from the past 48 hour(s))  APTT     Status: None   Collection Time    11/15/12  3:52 PM      Result Value Range   aPTT 24  24 - 37 seconds  CBC WITH DIFFERENTIAL     Status: None   Collection Time    11/15/12  3:52 PM      Result Value Range   WBC 7.1  4.0 - 10.5 K/uL   RBC 4.36  4.22 - 5.81 MIL/uL   Hemoglobin 13.5  13.0 - 17.0 g/dL   HCT 65.7  84.6 - 96.2 %   MCV 90.4  78.0 - 100.0 fL   MCH 31.0  26.0 - 34.0 pg   MCHC 34.3  30.0 - 36.0 g/dL   RDW 95.2  84.1 - 32.4 %   Platelets 241  150 - 400 K/uL   Neutrophils Relative % 75  43 - 77 %   Neutro Abs 5.4  1.7 - 7.7 K/uL   Lymphocytes Relative 16  12 - 46 %   Lymphs Abs 1.2  0.7 - 4.0 K/uL   Monocytes Relative 7  3 - 12 %   Monocytes Absolute 0.5  0.1 - 1.0 K/uL   Eosinophils Relative 1  0 - 5 %   Eosinophils Absolute 0.1  0.0 - 0.7 K/uL   Basophils Relative 1  0 - 1 %   Basophils Absolute 0.0  0.0 - 0.1 K/uL  COMPREHENSIVE METABOLIC PANEL     Status: Abnormal   Collection Time    11/15/12  3:52 PM      Result Value Range   Sodium 142  135 - 145 mEq/L   Potassium 4.7  3.5 - 5.1 mEq/L   Chloride 107  96 - 112 mEq/L   CO2 27  19 - 32 mEq/L   Glucose, Bld 116 (*) 70 - 99 mg/dL   BUN 29 (*) 6 - 23 mg/dL   Creatinine, Ser 1.61  0.50 - 1.35 mg/dL   Calcium 9.6  8.4 - 09.6 mg/dL   Total Protein 6.5  6.0 - 8.3 g/dL   Albumin 3.2 (*) 3.5 - 5.2 g/dL   AST 20  0 - 37 U/L   ALT 28  0 - 53 U/L   Alkaline Phosphatase 80  39 - 117 U/L   Total Bilirubin 0.2 (*) 0.3 - 1.2 mg/dL   GFR calc non Af Amer 45 (*) >90 mL/min   GFR calc Af Amer 52 (*) >90 mL/min   Comment: (NOTE)     The eGFR has been calculated using the CKD EPI equation.     This calculation has not been validated in all clinical situations.     eGFR's persistently <90  mL/min signify possible Chronic Kidney     Disease.  PROTIME-INR     Status: None   Collection Time    11/15/12  3:52 PM      Result Value Range   Prothrombin Time 13.2  11.6 - 15.2 seconds   INR 1.02  0.00 - 1.49   Dg Chest 2 View  11/15/2012   CLINICAL DATA:  79 year old male preoperative study. Recent pneumonia.  EXAM: CHEST  2 VIEW  COMPARISON:  11/07/2012 and earlier.  FINDINGS: Chronic large lung volumes. Interval resolved focal right lung base airspace opacity, but there are small bilateral pleural effusions. No pneumothorax or pulmonary edema. Probable right nipple shadow. No other new pulmonary opacity. Stable cardiac size and mediastinal contours. No acute osseous abnormality identified.  IMPRESSION: Interval resolved right lung airspace disease but small pleural effusions are present.  Probable right nipple shadow.   Electronically Signed   By: [name]   On: 11/15/2012 16:27    Review of Systems  Constitutional: Negative for fever.  HENT: Positive for hearing loss and tinnitus. Negative for neck pain.   Eyes: Positive for blurred vision.  Respiratory: Negative for cough and shortness of breath.   Cardiovascular: Negative for chest pain.  Gastrointestinal: Negative for nausea, vomiting and abdominal pain.  Neurological: Positive for dizziness, weakness and headaches.  Psychiatric/Behavioral: Positive for memory loss. The patient is not nervous/anxious.     There were no vitals taken for this visit. Physical Exam  Constitutional: He is oriented to person, place, and time. He appears well-developed and well-nourished.  Neck: Neck supple.  Cardiovascular: Normal rate, regular rhythm and normal heart sounds.   No murmur heard. Respiratory: Effort normal and breath sounds normal. He has no wheezes.  GI: Soft. Bowel sounds are normal. There is no tenderness.  Musculoskeletal: Normal range of  motion. He exhibits no edema.  Neurological: He is alert and oriented to person, place,  and time. Coordination normal.  Skin: Skin is warm and dry.  Psychiatric: He has a normal mood and affect. His behavior is normal. Judgment and thought content normal.     Assessment/Plan Rt Tinnutus x months- especially lying flat Memory difficulties; slight imbalance; dizziness Work up on MRI shows prob dural AV fistula Cerebral arteriogram 11/10/2013 verifies same Scheduled now for AV fistula embolization with Dr Corliss Skains Pt and wife aware of procedure benefits and risks and agreeable to proceed Pt will be admitted overnight in Neuro ICU for observation Plan dc in am    Tirrell Buchberger A 11/16/2012, 8:11 AM

## 2012-11-16 NOTE — Preoperative (Signed)
Beta Blockers   Reason not to administer Beta Blockers:Not Applicable 

## 2012-11-16 NOTE — Anesthesia Preprocedure Evaluation (Addendum)
Anesthesia Evaluation  Patient identified by MRN, date of birth, ID band Patient awake    Reviewed: Allergy & Precautions, H&P , NPO status , Patient's Chart, lab work & pertinent test results  Airway Mallampati: II TM Distance: >3 FB     Dental  (+) Dental Advidsory Given, Edentulous Upper, Edentulous Lower, Upper Dentures and Lower Dentures   Pulmonary pneumonia -,  breath sounds clear to auscultation        Cardiovascular hypertension, On Home Beta Blockers + CAD Rhythm:Regular Rate:Normal     Neuro/Psych PSYCHIATRIC DISORDERS TIA Neuromuscular disease    GI/Hepatic PUD, GERD-  ,  Endo/Other  Hypothyroidism   Renal/GU      Musculoskeletal   Abdominal   Peds  Hematology   Anesthesia Other Findings   Reproductive/Obstetrics                         Anesthesia Physical Anesthesia Plan  ASA: III  Anesthesia Plan: General   Post-op Pain Management:    Induction: Intravenous  Airway Management Planned: Oral ETT  Additional Equipment:   Intra-op Plan:   Post-operative Plan: Possible Post-op intubation/ventilation  Informed Consent: I have reviewed the patients History and Physical, chart, labs and discussed the procedure including the risks, benefits and alternatives for the proposed anesthesia with the patient or authorized representative who has indicated his/her understanding and acceptance.   Dental Advisory Given and Dental advisory given  Plan Discussed with: Anesthesiologist, CRNA and Surgeon  Anesthesia Plan Comments:        Anesthesia Quick Evaluation

## 2012-11-16 NOTE — Transfer of Care (Signed)
Immediate Anesthesia Transfer of Care Note  Patient: [name]  Procedure(s) Performed: Procedure(s): EMBOLIZATION  OF FISTULA (N/A)  Patient Location: PACU  Anesthesia Type:General  Level of Consciousness: awake and alert   Airway & Oxygen Therapy: Patient Spontanous Breathing and Patient connected to face mask oxygen  Post-op Assessment: Report given to PACU RN, Post -op Vital signs reviewed and stable and Patient moving all extremities X 4  Post vital signs: Reviewed and stable  Complications: No apparent anesthesia complications

## 2012-11-16 NOTE — Significant Event (Signed)
Will order SCD's for DVT prevention.  Coralyn Helling, MD 11/16/2012, 5:43 PM

## 2012-11-16 NOTE — Anesthesia Procedure Notes (Signed)
Procedure Name: Intubation Date/Time: 11/16/2012 9:28 AM Performed by: Carmela Rima Pre-anesthesia Checklist: Emergency Drugs available, Timeout performed, Patient being monitored, Patient identified and Suction available Patient Re-evaluated:Patient Re-evaluated prior to inductionOxygen Delivery Method: Circle system utilized Preoxygenation: Pre-oxygenation with 100% oxygen Intubation Type: IV induction Ventilation: Mask ventilation without difficulty and Oral airway inserted - appropriate to patient size Laryngoscope Size: Mac and 4 Grade View: Grade I Tube type: Oral Tube size: 7.5 mm Number of attempts: 1 Placement Confirmation: positive ETCO2,  ETT inserted through vocal cords under direct vision and breath sounds checked- equal and bilateral Secured at: 23 cm Tube secured with: Tape Dental Injury: Teeth and Oropharynx as per pre-operative assessment

## 2012-11-16 NOTE — Progress Notes (Signed)
Nursing 1600 Dr. Corliss Skains to bedside and assed patient.  Per MD, do not treat SBP until greater than 150.  Then start Cardene at a low dose to avoid dropping BP too quickly.  Cardene not needed at this time.  Will continue to assess BP.

## 2012-11-17 LAB — CBC WITH DIFFERENTIAL/PLATELET
Basophils Absolute: 0 10*3/uL (ref 0.0–0.1)
Basophils Relative: 1 % (ref 0–1)
Eosinophils Absolute: 0.2 10*3/uL (ref 0.0–0.7)
MCHC: 34.1 g/dL (ref 30.0–36.0)
Neutro Abs: 4.6 10*3/uL (ref 1.7–7.7)
Neutrophils Relative %: 68 % (ref 43–77)
RDW: 13 % (ref 11.5–15.5)

## 2012-11-17 LAB — BASIC METABOLIC PANEL
BUN: 17 mg/dL (ref 6–23)
Calcium: 8.8 mg/dL (ref 8.4–10.5)
Creatinine, Ser: 1.09 mg/dL (ref 0.50–1.35)
GFR calc Af Amer: 68 mL/min — ABNORMAL LOW (ref >90)
GFR calc non Af Amer: 59 mL/min — ABNORMAL LOW (ref >90)

## 2012-11-17 NOTE — Progress Notes (Signed)
Pt has ambulated in unit x2 with staff in attendance but with no assistance; tolerated well; no dizziness or balance problems.

## 2012-11-17 NOTE — Progress Notes (Signed)
1 Day Post-Op  Subjective: Pt with Dural AV fistula Staged embolization 8/21 with Dr Corliss Skains Coils and Onyx placed Pt has done well overnight Tinnitus is gone HA much improved No N/V; eating well Sitting up in chair  Objective: Vital signs in last 24 hours: Temp:  [97 F (36.1 C)-97.9 F (36.6 C)] 97.9 F (36.6 C) (08/22 0400) Pulse Rate:  [57-76] 57 (08/22 0700) Resp:  [0-22] 8 (08/22 0700) BP: (118-172)/(54-106) 127/56 mmHg (08/22 0700) SpO2:  [98 %-100 %] 99 % (08/22 0700) Arterial Line BP: (167-205)/(59-76) 190/62 mmHg (08/21 1730) Weight:  [170 lb 10.2 oz (77.4 kg)] 170 lb 10.2 oz (77.4 kg) (08/21 1455)    Intake/Output from previous day: 08/21 0701 - 08/22 0700 In: 3605 [P.O.:480; I.V.:3125] Out: 4700 [Urine:4600; Blood:100] Intake/Output this shift:    PE:  Afeb; vss A/O; appropriate Up in chair Moves all 4s; good strength Face symm; tongue midline Puffs cheeks = Rt groin: NT; no bleeding; no hematoma Rt foot: 2+ pulses  Lab Results:   Recent Labs  11/15/12 1552 11/17/12 0615  WBC 7.1 6.7  HGB 13.5 12.8*  HCT 39.4 37.5*  PLT 241 221   BMET  Recent Labs  11/15/12 1552 11/17/12 0615  NA 142 139  K 4.7 4.0  CL 107 105  CO2 27 27  GLUCOSE 116* 84  BUN 29* 17  CREATININE 1.35 1.09  CALCIUM 9.6 8.8   PT/INR  Recent Labs  11/15/12 1552  LABPROT 13.2  INR 1.02   ABG No results found for this basename: PHART, PCO2, PO2, HCO3,  in the last 72 hours  Studies/Results: Dg Chest 2 View  11/15/2012   CLINICAL DATA:  79 year old male preoperative study. Recent pneumonia.  EXAM: CHEST  2 VIEW  COMPARISON:  11/07/2012 and earlier.  FINDINGS: Chronic large lung volumes. Interval resolved focal right lung base airspace opacity, but there are small bilateral pleural effusions. No pneumothorax or pulmonary edema. Probable right nipple shadow. No other new pulmonary opacity. Stable cardiac size and mediastinal contours. No acute osseous abnormality  identified.  IMPRESSION: Interval resolved right lung airspace disease but small pleural effusions are present.  Probable right nipple shadow.   Electronically Signed   By: [name]   On: 11/15/2012 16:27    Anti-infectives: Anti-infectives   Start     Dose/Rate Route Frequency Ordered Stop   11/16/12 0700  vancomycin (VANCOCIN) 1,000 mg in sodium chloride 0.9 % 250 mL IVPB  Status:  Discontinued     1,000 mg 250 mL/hr over 60 Minutes Intravenous  Once 11/16/12 0645 11/16/12 0655   11/16/12 0700  vancomycin (VANCOCIN) IVPB 1000 mg/200 mL premix     1,000 mg 200 mL/hr over 60 Minutes Intravenous  Once 11/16/12 0655 11/16/12 9604      Assessment/Plan: s/p Procedure(s): EMBOLIZATION  OF FISTULA (N/A)  Dural AV fistula embolization 8/21 Overnight stay without event Pt has no complaints Tinnitus - resolved; HA resolved Will discuss with Dr Corliss Skains Plan for dc today   LOS: 1 day    [name] A 11/17/2012

## 2012-11-17 NOTE — Progress Notes (Signed)
Pt's Foley catheter removed 11/17/12 ~ 09:00.  Pt. Voided QS w/o difficulty ~ 14:45 prior to discharge.

## 2012-11-17 NOTE — Discharge Summary (Signed)
Physician Discharge Summary  Patient ID: [name] MRN: [id number] DOB/AGE: 1922-08-02 79 y.o.  Admit date: 11/16/2012 Discharge date: 11/17/2012  Admission Diagnoses:  Dural arteriovenous fistula  Discharge Diagnoses: Dural AV fistula embolization  Active Problems: Hypothyroid; Hyperlipidemia; anxiety; macular degeneration; CAD; GERD; PUD; Diverticulitis; elevated PSA; TIA; anemia; TMJ syndrome; Polymyalgia Rheumatica  Discharged Condition: improved  Hospital Course: Pt with history of headache and Rt ear tinnitus- especially lying down. Cerebral arteriogram confirmed Dural Arteriovenous Fistula. Staged embolization with coils and Onyx was performed in Neuro Interventional Radiology with Dr Julieanne Cotton 11/17/2102. Pt tolerated well. Procedure was successful. Overnight stay- uneventful.  Pt slept well; No N/V. Eating well this am. Has urinated and passing gas. Denies tinnitus- even lying down. Has only mild headache over Rt eye. Dr Corliss Skains has seen and examined pt. Plan for discharge today.   Consults: None  Significant Diagnostic Studies: Cerebral arteriogram  Treatments: Embolization of dural arteriovenous fistula  Discharge Exam: Blood pressure 113/61, pulse 71, temperature 98.1 F (36.7 C), temperature source Oral, resp. rate 15, height 5\' 10"  (1.778 m), weight 170 lb 10.2 oz (77.4 kg), SpO2 100.00%.  PE:  Afeb; vss Up in chair Neuro intact Appropriate Face symmetrical; smile = Puffs cheeks = Heart: RRR Lungs: CTA Abd: soft; +BS; NT Extr: FROM Good sensation; good strength Rt groin: NT; no bleeding; no hematoma Rt foot: 2+ pulses  Results for orders placed during the hospital encounter of 11/16/12  MRSA PCR SCREENING      Result Value Range   MRSA by PCR NEGATIVE  NEGATIVE  BASIC METABOLIC PANEL      Result Value Range   Sodium 139  135 - 145 mEq/L   Potassium 4.0  3.5 - 5.1 mEq/L   Chloride 105  96 - 112 mEq/L   CO2 27  19 - 32 mEq/L   Glucose, Bld  84  70 - 99 mg/dL   BUN 17  6 - 23 mg/dL   Creatinine, Ser 4.09  0.50 - 1.35 mg/dL   Calcium 8.8  8.4 - 81.1 mg/dL   GFR calc non Af Amer 59 (*) >90 mL/min   GFR calc Af Amer 68 (*) >90 mL/min  CBC WITH DIFFERENTIAL      Result Value Range   WBC 6.7  4.0 - 10.5 K/uL   RBC 4.15 (*) 4.22 - 5.81 MIL/uL   Hemoglobin 12.8 (*) 13.0 - 17.0 g/dL   HCT 91.4 (*) 78.2 - 95.6 %   MCV 90.4  78.0 - 100.0 fL   MCH 30.8  26.0 - 34.0 pg   MCHC 34.1  30.0 - 36.0 g/dL   RDW 21.3  08.6 - 57.8 %   Platelets 221  150 - 400 K/uL   Neutrophils Relative % 68  43 - 77 %   Neutro Abs 4.6  1.7 - 7.7 K/uL   Lymphocytes Relative 21  12 - 46 %   Lymphs Abs 1.4  0.7 - 4.0 K/uL   Monocytes Relative 8  3 - 12 %   Monocytes Absolute 0.6  0.1 - 1.0 K/uL   Eosinophils Relative 3  0 - 5 %   Eosinophils Absolute 0.2  0.0 - 0.7 K/uL   Basophils Relative 1  0 - 1 %   Basophils Absolute 0.0  0.0 - 0.1 K/uL   Disposition: Dural Arteriovenous Fistula embolization performed in IR 8/21 with Dr Corliss Skains Pt tolerated well. Procedure successful. Pt with resolved Rt ear tinnitus. Minimal headache. Dr Corliss Skains  has seen and examined pt. Continue all home meds Pt has good understanding of all DC instructions. To see Dr Corliss Skains in follow up in 2 weeks- pt will hear form scheduler for time and date. Call [phone] with questions or concerns.  Discharge Orders   Future Appointments Provider Department Dept Phone   01/05/2013 10:45 AM Gordy Savers, MD [facility] [phone]   Future Orders Complete By Expires   Call MD for:  persistant dizziness or light-headedness  As directed    Call MD for:  persistant nausea and vomiting  As directed    Call MD for:  redness, tenderness, or signs of infection (pain, swelling, redness, odor or green/yellow discharge around incision site)  As directed    Call MD for:  severe uncontrolled pain  As directed    Call MD for:  temperature >100.4  As directed     Diet - low sodium heart healthy  As directed    Discharge instructions  As directed    Comments:     restful at home today; increase activity daily; follow up with Dr Corliss Skains in 2 weeks- scheduler will call pt with appt date and time- [phone]   Discharge wound care:  As directed    Comments:     Change Rt groin bandage to band aid after shower; replace daily x 5 days   Driving Restrictions  As directed    Comments:     No driving x 2 weeks   Increase activity slowly  As directed    Lifting restrictions  As directed    Comments:     No lifting over 10 lbs x 2 weeks   Other Restrictions  As directed    Comments:     May shower today       Medication List         aspirin 81 MG tablet  Take 81 mg by mouth daily.     Co-Enzyme Q-10 100 MG Caps  Take 300 mg by mouth daily.     cyanocobalamin 500 MCG tablet  Take 500 mcg by mouth daily.     desonide 0.05 % ointment  Commonly known as:  DESOWEN  Apply 1 application topically as needed.     fluticasone 50 MCG/ACT nasal spray  Commonly known as:  FLONASE  Place 2 sprays into the nose daily.     folic acid 1 MG tablet  Commonly known as:  FOLVITE  Take 1 mg by mouth daily.     levofloxacin 750 MG tablet  Commonly known as:  LEVAQUIN  Take 1 tablet (750 mg total) by mouth daily.     LUCENTIS IO  Inject 1 each into the eye every 6 (six) weeks.     metoprolol succinate 25 MG 24 hr tablet  Commonly known as:  TOPROL-XL  Take 1 tablet (25 mg total) by mouth daily.     NEXIUM 40 MG capsule  Generic drug:  esomeprazole  Take 40 mg by mouth daily before breakfast.     nitroGLYCERIN 0.4 MG SL tablet  Commonly known as:  NITROSTAT  Place 1 tablet (0.4 mg total) under the tongue every 5 (five) minutes as needed.     pantoprazole 40 MG tablet  Commonly known as:  PROTONIX  Take 1 tablet (40 mg total) by mouth daily.     predniSONE 5 MG tablet  Commonly known as:  DELTASONE  Take 5 mg by mouth daily.  PRESERVISION/LUTEIN PO  Take 1 tablet by mouth 2 (two) times daily.     ranitidine 150 MG tablet  Commonly known as:  ZANTAC  Take 1 tablet (150 mg total) by mouth at bedtime.     tamsulosin 0.4 MG Caps capsule  Commonly known as:  FLOMAX  Take 1 capsule (0.4 mg total) by mouth daily.     traMADol 50 MG tablet  Commonly known as:  ULTRAM  Take 1 tablet (50 mg total) by mouth every 8 (eight) hours as needed for pain.     triamcinolone 0.1 % cream : eucerin Crea  Apply 1 application topically as needed.         Signed: Korrine Sicard A 11/17/2012, 10:25 AM

## 2012-11-17 NOTE — Progress Notes (Signed)
AVS, post-discharge instructions & home medications list reviewed with patient & wife, signed by patient.  Both IVs have been discontinued, right groin site dressing changed to band-aid; groin = level 0.  Pending discharge to home.

## 2012-11-18 ENCOUNTER — Other Ambulatory Visit (HOSPITAL_COMMUNITY): Payer: Self-pay | Admitting: Interventional Radiology

## 2012-11-18 DIAGNOSIS — I671 Cerebral aneurysm, nonruptured: Secondary | ICD-10-CM

## 2012-11-20 ENCOUNTER — Encounter (HOSPITAL_COMMUNITY): Payer: Self-pay | Admitting: Interventional Radiology

## 2012-11-30 ENCOUNTER — Other Ambulatory Visit (HOSPITAL_COMMUNITY): Payer: Self-pay | Admitting: Interventional Radiology

## 2012-11-30 ENCOUNTER — Telehealth (HOSPITAL_COMMUNITY): Payer: Self-pay | Admitting: Interventional Radiology

## 2012-11-30 DIAGNOSIS — Q2730 Arteriovenous malformation, site unspecified: Secondary | ICD-10-CM

## 2012-11-30 NOTE — Telephone Encounter (Signed)
Called pt to schedule 2 wk f/u visit, no answer and no VM JMichaux

## 2012-12-06 ENCOUNTER — Encounter: Payer: Self-pay | Admitting: Gastroenterology

## 2012-12-06 ENCOUNTER — Ambulatory Visit (INDEPENDENT_AMBULATORY_CARE_PROVIDER_SITE_OTHER): Payer: Medicare Other | Admitting: Gastroenterology

## 2012-12-06 VITALS — BP 126/74 | HR 68 | Ht 70.0 in | Wt 173.4 lb

## 2012-12-06 DIAGNOSIS — R05 Cough: Secondary | ICD-10-CM | POA: Diagnosis not present

## 2012-12-06 NOTE — Patient Instructions (Addendum)
You have been scheduled for a modified barium swallow on 12/13/12 at 11:30am. Please arrive 15 minutes prior to your test for registration. You will go to [facility] Radiology (1st Floor) for your appointment. Please refrain from eating or drinking anything 4 hours prior to your test. Should you need to cancel or reschedule your appointment, please contact [phone] [facility]) or [phone] [facility]). _____________________________________________________________________ A Modified Barium Swallow Study, or MBS, is a special x-ray that is taken to check swallowing skills. It is carried out by a Marine scientist and a Warehouse manager (SLP). During this test, yourmouth, throat, and esophagus, a muscular tube which connects your mouth to your stomach, is checked. The test will help you, your doctor, and the SLP plan what types of foods and liquids are easier for you to swallow. The SLP will also identify positions and ways to help you swallow more easily and safely. What will happen during an MBS? You will be taken to an x-ray room and seated comfortably. You will be asked to swallow small amounts of food and liquid mixed with barium. Barium is a liquid or paste that allows images of your mouth, throat and esophagus to be seen on x-ray. The x-ray captures moving images of the food you are swallowing as it travels from your mouth through your throat and into your esophagus. This test helps identify whether food or liquid is entering your lungs (aspiration). The test also shows which part of your mouth or throat lacks strength or coordination to move the food or liquid in the right direction. This test typically takes 30 minutes to 1 hour to complete. _______________________________________________________________________  We have scheduled you an appointment with Dr. Sherene Sires at [facility] on the 2nd floor of the [facility] building on 12/15/12 at 2:15pm. Please arrive 15 minutes early for  registration. If you need to reschedule or cancel please call [phone] to avoid a no show fee.  We have also scheduled an appointment with Dr. Lesia Hausen on  12/11/12 1:00pm.   Thank you for choosing me and [facility].  Venita Lick. Pleas Koch., MD., Clementeen Graham

## 2012-12-06 NOTE — Progress Notes (Addendum)
History of Present Illness: This is an 79 year old male who complains of the ongoing cough and mucus in his throat. He states he frequently notes that swallowing leads to coughing but he has coughing at all times during the day. Cough is not a problem during sleep. His symptoms started several months ago and they have been a persistent problem. He was recently hospitalized and I reviewed the records of his hospitalization. He states he was evaluated by an ENT this summer. He appears to have memory deficits. He underwent a barium esophagram in March 2014 that showed a small hiatal hernia, a prominent cricopharyngeus and tertiary contractions in the mid and distal esophagus. He underwent EGD in May 2011 for dysphagia but no stricture or other esophageal pathology was noted. His reflux symptoms have been well controlled on daily Nexium.  Current Medications, Allergies, Past Medical History, Past Surgical History, Family History and Social History were reviewed in [employer] record.  Physical Exam: General: Well developed , well nourished, elderly, no acute distress Head: Normocephalic and atraumatic Eyes:  sclerae anicteric, EOMI Ears: Normal auditory acuity Mouth: No deformity or lesions Lungs: Clear throughout to auscultation Heart: Regular rate and rhythm; no murmurs, rubs or bruits Abdomen: Soft, non tender and non distended. No masses, hepatosplenomegaly or hernias noted. Normal Bowel sounds Musculoskeletal: Symmetrical with no gross deformities  Pulses:  Normal pulses noted Extremities: No clubbing, cyanosis, edema or deformities noted Neurological: Alert oriented x 4, memory deficits, slightly unsteady gait Psychological:  Alert and cooperative. Anxious   Assessment and Recommendations:  1. Chronic cough with mucous. Cough often brought on by swallowing but occurs throughout the day. Polymyalgia. Rule out oropharyngeal dysphasia. Schedule MBSS. Pulmonary referral. His  symptoms do not appear to be reflux related.  2. GERD. Well controlled. Continue Nexium 40 mg daily.   3. Unsteady gait. Followup with Dr. Frederica Kuster

## 2012-12-07 ENCOUNTER — Other Ambulatory Visit (HOSPITAL_COMMUNITY): Payer: Self-pay | Admitting: Gastroenterology

## 2012-12-07 DIAGNOSIS — R1310 Dysphagia, unspecified: Secondary | ICD-10-CM

## 2012-12-07 DIAGNOSIS — J690 Pneumonitis due to inhalation of food and vomit: Secondary | ICD-10-CM

## 2012-12-07 HISTORY — PX: OTHER SURGICAL HISTORY: SHX169

## 2012-12-11 ENCOUNTER — Ambulatory Visit (INDEPENDENT_AMBULATORY_CARE_PROVIDER_SITE_OTHER): Payer: Medicare Other | Admitting: Internal Medicine

## 2012-12-11 ENCOUNTER — Encounter: Payer: Self-pay | Admitting: Internal Medicine

## 2012-12-11 VITALS — BP 130/70 | HR 67 | Temp 97.9°F | Resp 20 | Wt 175.0 lb

## 2012-12-11 DIAGNOSIS — R05 Cough: Secondary | ICD-10-CM | POA: Diagnosis not present

## 2012-12-11 DIAGNOSIS — M353 Polymyalgia rheumatica: Secondary | ICD-10-CM

## 2012-12-11 DIAGNOSIS — E538 Deficiency of other specified B group vitamins: Secondary | ICD-10-CM

## 2012-12-11 MED ORDER — FLUTICASONE PROPIONATE 50 MCG/ACT NA SUSP: 2.0000 | Freq: Every day | NASAL | Status: DC

## 2012-12-11 NOTE — Progress Notes (Signed)
Subjective:    Patient ID: [name], male    DOB: [birth year], 79 y.o.   MRN: [id number]  HPI 79 year old patient who is seen today for followup. He has recently been seen by GI with some swallowing difficulties postnasal drip and cough. He has been scheduled for a MBS and pulmonary evaluation. Today he also complains of unsteady gait. Last month he underwent embolization of a dural AV fistula. He has a history of PMR as well as B12 deficiency treated with oral B12 supplements.  Past Medical History  Diagnosis Date  . IBS (irritable bowel syndrome)   . Hemorrhoids   . Diverticulosis   . GERD (gastroesophageal reflux disease)   . Tubular adenoma of colon 07/1998  . Anxiety and depression   . Elevated prostate specific antigen (PSA)   . Vitamin B12 deficiency   . UTI (lower urinary tract infection)   . Arthritis   . Sciatica   . Anemia   . TIA (transient ischemic attack)   . Peptic ulcer disease   . Hyperlipidemia   . CAD (coronary artery disease)     PCI 2006, Stress Perfusion Study 2011  . Hypertension   . Rheumatoid arthritis(714.0)   . Polymyalgia     History   Social History  . Marital Status: Married    Spouse Name: N/A    Number of Children: 1  . Years of Education: N/A   Occupational History  . Retired    Social History Main Topics  . Smoking status: Former Smoker    Types: Cigarettes    Quit date: 03/29/1972  . Smokeless tobacco: Never Used  . Alcohol Use: No     Comment: occasional  . Drug Use: No  . Sexual Activity: Not Currently   Other Topics Concern  . Not on file   Social History Narrative  . No narrative on file    Past Surgical History  Procedure Laterality Date  . Ptca  2006    Stenting of proximal LAD w/ 75 % stenosis reduced to 0%  . Cataract extraction    . Cholecystectomy  2001  . Hernia repair    . Hip fracture surgery  03/2007    Right  . Salivary stone removal  11/2007  . Radiology with anesthesia N/A 11/16/2012   Procedure: EMBOLIZATION  OF FISTULA;  Surgeon: Oneal Grout, MD;  Location: [facility] OR;  Service: Radiology;  Laterality: N/A;    Family History  Problem Relation Age of Onset  . Colon cancer Maternal Uncle 50    ish  . Coronary artery disease Father     Allergies  Allergen Reactions  . Amoxicillin     REACTION: unspecified  . Doxycycline   . Erythromycin     REACTION: unspecified  . Penicillins     rash  . Sulfamethoxazole W-Trimethoprim     unknown    Current Outpatient Prescriptions on File Prior to Visit  Medication Sig Dispense Refill  . aspirin 81 MG tablet Take 81 mg by mouth daily.      Marland Kitchen Co-Enzyme Q-10 100 MG CAPS Take 300 mg by mouth daily.      . cyanocobalamin 500 MCG tablet Take 500 mcg by mouth daily.      Marland Kitchen desonide (DESOWEN) 0.05 % ointment Apply 1 application topically as needed.      . fluticasone (FLONASE) 50 MCG/ACT nasal spray Place 2 sprays into the nose daily.  16 g  6  . folic acid (FOLVITE) 1  MG tablet Take 1 mg by mouth daily.      . metoprolol succinate (TOPROL-XL) 25 MG 24 hr tablet Take 1 tablet (25 mg total) by mouth daily.  90 tablet  2  . Multiple Vitamins-Minerals (PRESERVISION/LUTEIN PO) Take 1 tablet by mouth 2 (two) times daily.      Marland Kitchen NEXIUM 40 MG capsule Take 40 mg by mouth daily before breakfast.       . nitroGLYCERIN (NITROSTAT) 0.4 MG SL tablet Place 1 tablet (0.4 mg total) under the tongue every 5 (five) minutes as needed.  25 tablet  6  . predniSONE (DELTASONE) 5 MG tablet Take 5 mg by mouth daily.      . Ranibizumab (LUCENTIS IO) Inject 1 each into the eye every 6 (six) weeks.      . tamsulosin (FLOMAX) 0.4 MG CAPS capsule Take 1 capsule (0.4 mg total) by mouth daily.  30 capsule  0  . traMADol (ULTRAM) 50 MG tablet Take 1 tablet (50 mg total) by mouth every 8 (eight) hours as needed for pain.  90 tablet  4  . Triamcinolone Acetonide (TRIAMCINOLONE 0.1 % CREAM : EUCERIN) CREA Apply 1 application topically as needed.       No  current facility-administered medications on file prior to visit.    BP 130/70  Pulse 67  Temp(Src) 97.9 F (36.6 C) (Oral)  Resp 20  Wt 175 lb (79.379 kg)  BMI 25.11 kg/m2  SpO2 98%        Review of Systems  Constitutional: Negative for fever, chills, appetite change and fatigue.  HENT: Positive for rhinorrhea, postnasal drip and tinnitus. Negative for hearing loss, ear pain, congestion, sore throat, trouble swallowing, neck stiffness, dental problem and voice change.   Eyes: Negative for pain, discharge and visual disturbance.  Respiratory: Positive for cough. Negative for chest tightness, wheezing and stridor.   Cardiovascular: Negative for chest pain, palpitations and leg swelling.  Gastrointestinal: Negative for nausea, vomiting, abdominal pain, diarrhea, constipation, blood in stool and abdominal distention.  Genitourinary: Negative for urgency, hematuria, flank pain, discharge, difficulty urinating and genital sores.  Musculoskeletal: Positive for gait problem. Negative for myalgias, back pain, joint swelling and arthralgias.  Skin: Negative for rash.  Neurological: Positive for headaches. Negative for dizziness, syncope, speech difficulty, weakness and numbness.  Hematological: Negative for adenopathy. Does not bruise/bleed easily.  Psychiatric/Behavioral: Negative for behavioral problems and dysphoric mood. The patient is not nervous/anxious.        Objective:   Physical Exam  Constitutional: He is oriented to person, place, and time. He appears well-developed.  HENT:  Head: Normocephalic.  Right Ear: External ear normal.  Left Ear: External ear normal.  Eyes: Conjunctivae and EOM are normal.  Neck: Normal range of motion.  Cardiovascular: Normal rate and normal heart sounds.   Pulmonary/Chest: Breath sounds normal.  Abdominal: Bowel sounds are normal.  Musculoskeletal: Normal range of motion. He exhibits no edema and no tenderness.  Neurological: He is alert  and oriented to person, place, and time.  Finger to nose and heel to shin testing performed slowly but no gross ataxia Gait is quite unsteady  Psychiatric: He has a normal mood and affect. His behavior is normal.          Assessment & Plan:  Unsteady gait. Will check a B12 level PMR. Will check a sedimentation rate. Followup rheumatology Dyslipidemia Cough probably secondary to postnasal drip. Will place on fluticasone and Claritin

## 2012-12-11 NOTE — Patient Instructions (Signed)
Use Claritin and fluticasone nasal spray daily Pulmonary followup as scheduled

## 2012-12-12 ENCOUNTER — Ambulatory Visit (HOSPITAL_COMMUNITY)
Admission: RE | Admit: 2012-12-12 | Discharge: 2012-12-12 | Disposition: A | Discharge status: Home / Self Care | Payer: Medicare Other | Source: Ambulatory Visit | Attending: Interventional Radiology | Admitting: Interventional Radiology

## 2012-12-12 DIAGNOSIS — Q2730 Arteriovenous malformation, site unspecified: Secondary | ICD-10-CM

## 2012-12-12 DIAGNOSIS — R42 Dizziness and giddiness: Secondary | ICD-10-CM | POA: Diagnosis not present

## 2012-12-12 LAB — VITAMIN B12: Vitamin B-12: 555 pg/mL (ref 211–911)

## 2012-12-13 ENCOUNTER — Ambulatory Visit (HOSPITAL_COMMUNITY)
Admission: RE | Admit: 2012-12-13 | Discharge: 2012-12-13 | Disposition: A | Discharge status: Home / Self Care | Payer: Medicare Other | Source: Ambulatory Visit | Attending: Gastroenterology | Admitting: Gastroenterology

## 2012-12-13 DIAGNOSIS — J690 Pneumonitis due to inhalation of food and vomit: Secondary | ICD-10-CM

## 2012-12-13 DIAGNOSIS — R1310 Dysphagia, unspecified: Secondary | ICD-10-CM | POA: Insufficient documentation

## 2012-12-13 DIAGNOSIS — R05 Cough: Secondary | ICD-10-CM

## 2012-12-13 NOTE — Procedures (Signed)
Objective Swallowing Evaluation: Modified Barium Swallowing Study  Patient Details  Name: [name] MRN: [id number] Date of Birth: 1922/04/21  Today's Date: 12/13/2012 Time: 1130-1150 SLP Time Calculation (min): 20 min  Past Medical History:  Past Medical History  Diagnosis Date  . IBS (irritable bowel syndrome)   . Hemorrhoids   . Diverticulosis   . GERD (gastroesophageal reflux disease)   . Tubular adenoma of colon 07/1998  . Anxiety and depression   . Elevated prostate specific antigen (PSA)   . Vitamin B12 deficiency   . UTI (lower urinary tract infection)   . Arthritis   . Sciatica   . Anemia   . TIA (transient ischemic attack)   . Peptic ulcer disease   . Hyperlipidemia   . CAD (coronary artery disease)     PCI 2006, Stress Perfusion Study 2011  . Hypertension   . Rheumatoid arthritis(714.0)   . Polymyalgia    Past Surgical History:  Past Surgical History  Procedure Laterality Date  . Ptca  2006    Stenting of proximal LAD w/ 75 % stenosis reduced to 0%  . Cataract extraction    . Cholecystectomy  2001  . Hernia repair    . Hip fracture surgery  03/2007    Right  . Salivary stone removal  11/2007  . Radiology with anesthesia N/A 11/16/2012    Procedure: EMBOLIZATION  OF FISTULA;  Surgeon: Oneal Grout, MD;  Location: [facility] OR;  Service: Radiology;  Laterality: N/A;   HPI:  This is an 79 year old male, arriving for an outpatient MBS, referred by GI. He has also seen ENT recently. He complains frequent throat clearing and cough and mucous in his throat. He states he frequently notes that swallowing leads to coughing but he has coughing at all times during the day. His symptoms started several months ago and they have been a persistent problem. He was recently hospitalized for pna. Also recently underwent surgery for Lt common carotid and Lt external carotid arteriogram followed by staged embolization of large fast flow lt sided dural AV fistula using coils. He  underwent a barium esophagram in March 2014 that showed a small hiatal hernia, a prominent cricopharyngeus and tertiary contractions in the mid and distal esophagus. He underwent EGD in May 2011 for dysphagia but no stricture or other esophageal pathology was noted. His reflux symptoms have been well controlled on daily Nexium per GI.     Assessment / Plan / Recommendation Clinical Impression  Dysphagia Diagnosis: Within Functional Limits Clinical impression: Pt presents with swallow function WNL. Pt exhibited 1-2 instances of flash penetration above the cords, which is normal function. Prominent CP also noted, but did not restrict entrance of bolus through UES. There were no residuals observed in pharynx or during esophageal sweep. Pt was clearing his throat complaining of a feeling of something stuck throughout exam. SLP offered visual feedback to reinforce absence of PO residual. Recommend pt continue current diet (regular/thin), No SLP f/u needed. Pt is already under the care of GI/ENT.     Treatment Recommendation  No treatment recommended at this time    Diet Recommendation Regular;Thin liquid   Liquid Administration via: Cup;Straw Medication Administration: Whole meds with liquid Supervision: Patient able to self feed    Other  Recommendations Oral Care Recommendations: Patient independent with oral care   Follow Up Recommendations  None    Frequency and Duration        Pertinent Vitals/Pain NA    SLP Swallow  Goals     General HPI: This is an 79 year old male, arriving for an outpatient MBS, referred by GI. He has also seen ENT recently. He complains frequent throat clearing and cough and mucous in his throat. He states he frequently notes that swallowing leads to coughing but he has coughing at all times during the day. His symptoms started several months ago and they have been a persistent problem. He was recently hospitalized for pna. Also recently underwent surgery for Lt  common carotid and Lt external carotid arteriogram followed by staged embolization of large fast flow lt sided dural AV fistula using coils. He underwent a barium esophagram in March 2014 that showed a small hiatal hernia, a prominent cricopharyngeus and tertiary contractions in the mid and distal esophagus. He underwent EGD in May 2011 for dysphagia but no stricture or other esophageal pathology was noted. His reflux symptoms have been well controlled on daily Nexium per GI. Type of Study: Modified Barium Swallowing Study Reason for Referral: Objectively evaluate swallowing function Previous Swallow Assessment: see HPI Diet Prior to this Study: Regular;Thin liquids Temperature Spikes Noted: N/A Respiratory Status: Room air History of Recent Intubation: No Behavior/Cognition: Alert;Cooperative;Pleasant mood Oral Cavity - Dentition: Adequate natural dentition Oral Motor / Sensory Function: Within functional limits Self-Feeding Abilities: Able to feed self Patient Positioning: Upright in chair Baseline Vocal Quality: Clear Volitional Cough: Strong Volitional Swallow: Able to elicit Anatomy: Within functional limits Pharyngeal Secretions: Not observed secondary MBS    Reason for Referral Objectively evaluate swallowing function   Oral Phase Oral Preparation/Oral Phase Oral Phase: WFL   Pharyngeal Phase Pharyngeal Phase Pharyngeal Phase: Within functional limits  Cervical Esophageal Phase    GO    Cervical Esophageal Phase Cervical Esophageal Phase: [facility]    Functional Assessment Tool Used: clinical judgement Functional Limitations: Swallowing Swallow Current Status (Z6109): 0 percent impaired, limited or restricted Swallow Goal Status (U0454): 0 percent impaired, limited or restricted Swallow Discharge Status [phone]): 0 percent impaired, limited or restricted   [facility], MA CCC-SLP [phone]  Claudine Mouton 12/13/2012, 12:17 PM

## 2012-12-14 ENCOUNTER — Other Ambulatory Visit: Payer: Self-pay | Admitting: Internal Medicine

## 2012-12-14 MED ORDER — ESCITALOPRAM OXALATE 5 MG PO TABS: 5.0000 mg | ORAL_TABLET | Freq: Every day | ORAL | Status: DC

## 2012-12-15 ENCOUNTER — Ambulatory Visit (INDEPENDENT_AMBULATORY_CARE_PROVIDER_SITE_OTHER)
Admission: RE | Admit: 2012-12-15 | Discharge: 2012-12-15 | Disposition: A | Discharge status: Home / Self Care | Payer: Medicare Other | Source: Ambulatory Visit | Attending: Internal Medicine | Admitting: Internal Medicine

## 2012-12-15 ENCOUNTER — Ambulatory Visit (INDEPENDENT_AMBULATORY_CARE_PROVIDER_SITE_OTHER): Payer: Medicare Other | Admitting: Internal Medicine

## 2012-12-15 ENCOUNTER — Encounter: Payer: Self-pay | Admitting: Internal Medicine

## 2012-12-15 VITALS — BP 126/74 | HR 66 | Temp 97.0°F | Ht 71.0 in | Wt 173.4 lb

## 2012-12-15 DIAGNOSIS — J40 Bronchitis, not specified as acute or chronic: Secondary | ICD-10-CM | POA: Diagnosis not present

## 2012-12-15 DIAGNOSIS — R05 Cough: Secondary | ICD-10-CM

## 2012-12-15 MED ORDER — BENZONATATE 200 MG PO CAPS: 200.0000 mg | ORAL_CAPSULE | Freq: Three times a day (TID) | ORAL | Status: DC | PRN

## 2012-12-15 NOTE — Progress Notes (Signed)
Quick Note:  Spoke with pt and notified of results per Dr. Wert. Pt verbalized understanding and denied any questions.  ______ 

## 2012-12-15 NOTE — Progress Notes (Signed)
Subjective:    Patient ID: [name], male    DOB: Jun 20, 1922  MRN: [id number]  HPI  65 yowm quit smoking 1973 and quite athletic through his senior years s limiting sob on chronic pred rx for PMR tapered to 5 mg daiy chronically @  new onset onset cough July 2014 initially eval  By ENT > ? gerd > eval by Russella Dar >neg eval so referred 12/15/2012 to pulmonary clinic  12/15/2012 1st [facility] office visit/ Fitzgerald Dunne cc abrupt onset cough July 2014 assoc with nasal congestion and facial pain prod min white mucus and sense that something is stuck in throat while on nexium 30 min ac qd and typically starts coughing daily p bfast but not at night and never before bfast   No obvious day to day or daytime variabilty or assoc sob or cp or chest tightness, subjective wheeze overt sinus or hb symptoms. No unusual exp hx or h/o childhood pna/ asthma or knowledge of premature birth.  Sleeping ok without nocturnal  or early am exacerbation  of respiratory  c/o's or need for noct saba. Also denies any obvious fluctuation of symptoms with weather or environmental changes or other aggravating or alleviating factors except as outlined above   Current Medications, Allergies, Complete Past Medical History, Past Surgical History, Family History, and Social History were reviewed in [employer] record.        Kouffman Reflux v Neurogenic Cough Differentiator Reflux Comments  Do you awaken from a sound sleep coughing violently?                            With trouble breathing? nl   Do you have choking episodes when you cannot  Get enough air, gasping for air ?              no   Do you usually cough when you lie down into  The bed, or when you just lie down to rest ?                          no   Do you usually cough after meals or eating?         yes   Do you cough when (or after) you bend over?    no   GERD SCORE     Kouffman Reflux v Neurogenic Cough Differentiator Neurogenic    Do you more-or-less cough all day long? yes   Does change of temperature make you cough? no   Does laughing or chuckling cause you to cough? no   Do fumes (perfume, automobile fumes, burned  Toast, etc.,) cause you to cough ?      no   Does speaking, singing, or talking on the phone cause you to cough   ?               No    Neurogenic/Airway score       Review of Systems  Constitutional: Negative for fever, chills, activity change, appetite change and unexpected weight change.  HENT: Positive for congestion and sneezing. Negative for sore throat, rhinorrhea, trouble swallowing, dental problem, voice change and postnasal drip.   Eyes: Negative for visual disturbance.  Respiratory: Positive for cough. Negative for choking and shortness of breath.   Cardiovascular: Negative for chest pain and leg swelling.  Gastrointestinal: Negative for nausea, vomiting and abdominal pain.  Genitourinary: Negative for  difficulty urinating.  Musculoskeletal: Negative for arthralgias.  Skin: Negative for rash.  Psychiatric/Behavioral: Negative for behavioral problems and confusion.       Objective:   Physical Exam  amb wm nad  Wt Readings from Last 3 Encounters:  12/15/12 173 lb 6.4 oz (78.654 kg)  12/11/12 175 lb (79.379 kg)  12/06/12 173 lb 6.4 oz (78.654 kg)      HEENT: nl dentition, mod bilateral non-specific edema of turbinates, and orophanx. Nl external ear canals without cough reflex   NECK :  without JVD/Nodes/TM/ nl carotid upstrokes bilaterally   LUNGS: no acc muscle use, clear to A and P bilaterally without cough on insp or exp maneuvers   CV:  RRR  no s3 or murmur or increase in P2, no edema   ABD:  soft and nontender with nl excursion in the supine position. No bruits or organomegaly, bowel sounds nl  MS:  warm without deformities, calf tenderness, cyanosis or clubbing  SKIN: warm and dry without lesions    NEURO:  alert, approp, no deficits    CXR  12/15/2012  :  Hyperexpanded lungs and bronchitic change without acute cardiopulmonary disease.       Assessment & Plan:

## 2012-12-15 NOTE — Patient Instructions (Addendum)
Try tessalon 200 mg up to every 4 hours for the sensation of throat tickle  Please remember to go to the   x-ray department downstairs for your tests - we will call you with the results when they are available.  GERD (REFLUX)  is an extremely common cause of respiratory symptoms, many times with no significant heartburn at all.    It can be treated with medication, but also with lifestyle changes including avoidance of late meals, excessive alcohol, smoking cessation, and avoid fatty foods, chocolate, peppermint, colas, red wine, and acidic juices such as orange juice.  NO MINT OR MENTHOL PRODUCTS SO NO COUGH DROPS  USE SUGARLESS CANDY INSTEAD (jolley ranchers or Stover's)  NO OIL BASED VITAMINS - use powdered substitutes.    Please schedule a follow up office visit in 4 weeks, sooner if needed with all medications in hand

## 2012-12-16 NOTE — Assessment & Plan Note (Signed)
The most common causes of chronic cough in immunocompetent adults include the following: upper airway cough syndrome (UACS), previously referred to as postnasal drip syndrome (PNDS), which is caused by variety of rhinosinus conditions; (2) asthma; (3) GERD; (4) chronic bronchitis from cigarette smoking or other inhaled environmental irritants; (5) nonasthmatic eosinophilic bronchitis; and (6) bronchiectasis.   These conditions, singly or in combination, have accounted for up to 94% of the causes of chronic cough in prospective studies.   Other conditions have constituted no >6% of the causes in prospective studies These have included bronchogenic carcinoma, chronic interstitial pneumonia, sarcoidosis, left ventricular failure, ACEI-induced cough, and aspiration from a condition associated with pharyngeal dysfunction.    Chronic cough is often simultaneously caused by more than one condition. A single cause has been found from 38 to 82% of the time, multiple causes from 18 to 62%. Multiply caused cough has been the result of three diseases up to 42% of the time.       Most likely this represents  Classic Upper airway cough syndrome, so named because it's frequently impossible to sort out how much is  CR/sinusitis with freq throat clearing (which can be related to primary GERD)   vs  causing  secondary (" extra esophageal")  GERD from wide swings in gastric pressure that occur with throat clearing, often  promoting self use of mint and menthol lozenges that reduce the lower esophageal sphincter tone and exacerbate the problem further in a cyclical fashion.   These are the same pts (now being labeled as having "irritable larynx syndrome" by some cough centers) who not infrequently have a history of having failed to tolerate ace inhibitors,  dry powder inhalers or biphosphonates or report having atypical reflux symptoms that don't respond to standard doses of PPI , and are easily confused as having aecopd or  asthma flares by even experienced allergists/ pulmonologists. s  For now try to eliminate daytime cyclical coughing with tessilon max rx for gerd with ppi and diet, reviewed  Very unlikely to be allergy/ asthma given the prednisone dep PMR Concerned we don't have full med reconciliation given all the different providers he sees some of whom are not in the EPIC system so will use a trust but verify approach here and have him bring all meds to all visits

## 2012-12-18 DIAGNOSIS — M899 Disorder of bone, unspecified: Secondary | ICD-10-CM | POA: Diagnosis not present

## 2012-12-18 DIAGNOSIS — H532 Diplopia: Secondary | ICD-10-CM | POA: Diagnosis not present

## 2012-12-18 DIAGNOSIS — M353 Polymyalgia rheumatica: Secondary | ICD-10-CM | POA: Diagnosis not present

## 2012-12-20 DIAGNOSIS — H353290 Exudative age-related macular degeneration, unspecified eye, stage unspecified: Secondary | ICD-10-CM | POA: Diagnosis not present

## 2012-12-28 DIAGNOSIS — H353290 Exudative age-related macular degeneration, unspecified eye, stage unspecified: Secondary | ICD-10-CM | POA: Diagnosis not present

## 2012-12-28 DIAGNOSIS — H353190 Nonexudative age-related macular degeneration, unspecified eye, stage unspecified: Secondary | ICD-10-CM | POA: Diagnosis not present

## 2012-12-28 DIAGNOSIS — Z961 Presence of intraocular lens: Secondary | ICD-10-CM | POA: Diagnosis not present

## 2012-12-28 DIAGNOSIS — H43819 Vitreous degeneration, unspecified eye: Secondary | ICD-10-CM | POA: Diagnosis not present

## 2013-01-05 ENCOUNTER — Ambulatory Visit (INDEPENDENT_AMBULATORY_CARE_PROVIDER_SITE_OTHER): Payer: Medicare Other | Admitting: Internal Medicine

## 2013-01-05 ENCOUNTER — Encounter: Payer: Self-pay | Admitting: Internal Medicine

## 2013-01-05 VITALS — BP 120/84 | HR 83 | Temp 98.1°F | Wt 168.0 lb

## 2013-01-05 DIAGNOSIS — R059 Cough, unspecified: Secondary | ICD-10-CM

## 2013-01-05 DIAGNOSIS — R05 Cough: Secondary | ICD-10-CM

## 2013-01-05 DIAGNOSIS — M353 Polymyalgia rheumatica: Secondary | ICD-10-CM

## 2013-01-05 DIAGNOSIS — K219 Gastro-esophageal reflux disease without esophagitis: Secondary | ICD-10-CM

## 2013-01-05 DIAGNOSIS — E039 Hypothyroidism, unspecified: Secondary | ICD-10-CM | POA: Diagnosis not present

## 2013-01-05 NOTE — Patient Instructions (Signed)
Avoids foods high in acid such as tomatoes citrus juices, and spicy foods.  Avoid eating within two hours of lying down or before exercising.  Do not overheat.  Try smaller more frequent meals.  If symptoms persist, elevate the head of her bed 12 inches while sleeping.  Call or return to clinic prn if these symptoms worsen or fail to improve as anticipated.  Return in 6 months for follow-up

## 2013-01-05 NOTE — Progress Notes (Signed)
Subjective:    Patient ID: [name], male    DOB: October 30, 1922, 79 y.o.   MRN: [id number]  HPI  79 year old patient who is seen today for followup.  Earlier today he had an episode of left upper quadrant pain that has resolved. He is quite anxious and wonders if a CT abdominal scan would be prudent. He has been seen by GI and pulmonary recently. He has a history of PMR and is on prednisone 5 mg daily He does have a history of anxiety depression and has self discontinued Lexapro  Wt Readings from Last 3 Encounters:  01/05/13 168 lb (76.204 kg)  12/15/12 173 lb 6.4 oz (78.654 kg)  12/11/12 175 lb (79.379 kg)   Past Medical History  Diagnosis Date  . IBS (irritable bowel syndrome)   . Hemorrhoids   . Diverticulosis   . GERD (gastroesophageal reflux disease)   . Tubular adenoma of colon 07/1998  . Anxiety and depression   . Elevated prostate specific antigen (PSA)   . Vitamin B12 deficiency   . UTI (lower urinary tract infection)   . Arthritis   . Sciatica   . Anemia   . TIA (transient ischemic attack)   . Peptic ulcer disease   . Hyperlipidemia   . CAD (coronary artery disease)     PCI 2006, Stress Perfusion Study 2011  . Hypertension   . Rheumatoid arthritis(714.0)   . Polymyalgia     History   Social History  . Marital Status: Married    Spouse Name: N/A    Number of Children: 1  . Years of Education: N/A   Occupational History  . Retired Char[employer] fundraiser    Social History Main Topics  . Smoking status: Former Smoker -- 0.25 packs/day for 20 years    Types: Cigarettes    Quit date: 03/30/1971  . Smokeless tobacco: Never Used  . Alcohol Use: No     Comment: occasional  . Drug Use: No  . Sexual Activity: Not Currently   Other Topics Concern  . Not on file   Social History Narrative  . No narrative on file    Past Surgical History  Procedure Laterality Date  . Ptca  2006    Stenting of proximal LAD w/ 75 % stenosis reduced to 0%  . Cataract extraction  Bilateral   . Cholecystectomy  2001  . Hernia repair    . Hip fracture surgery  03/2007    Right  . Salivary stone removal  11/2007  . Radiology with anesthesia N/A 11/16/2012    Procedure: EMBOLIZATION  OF FISTULA;  Surgeon: Oneal Grout, MD;  Location: [facility] OR;  Service: Radiology;  Laterality: N/A;  . 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 Colon cancer Maternal Uncle 50    ish  . Coronary artery disease Father     Allergies  Allergen Reactions  . Amoxicillin     REACTION: unspecified  . Doxycycline   . Erythromycin     REACTION: unspecified  . Penicillins     rash  . Sulfamethoxazole-Trimethoprim     unknown    Current Outpatient Prescriptions on File Prior to Visit  Medication Sig Dispense Refill  . aspirin 81 MG tablet Take 81 mg by mouth daily.      . benzonatate (TESSALON) 200 MG capsule Take 1 capsule (200 mg total) by mouth 3 (three) times daily as needed for cough.  40 capsule  2  . Co-Enzyme Q-10 100 MG CAPS Take 300 mg by mouth daily.      . cyanocobalamin 500 MCG tablet Take 500 mcg by mouth daily.      Marland Kitchen desonide (DESOWEN) 0.05 % ointment Apply 1 application topically as needed.      . folic acid (FOLVITE) 1 MG tablet Take 1 mg by mouth daily.      . metoprolol succinate (TOPROL-XL) 25 MG 24 hr tablet Take 1 tablet (25 mg total) by mouth daily.  90 tablet  2  . Multiple Vitamins-Minerals (PRESERVISION/LUTEIN PO) Take 1 tablet by mouth 2 (two) times daily.      Marland Kitchen NEXIUM 40 MG capsule Take 40 mg by mouth daily before breakfast.       . nitroGLYCERIN (NITROSTAT) 0.4 MG SL tablet Place 1 tablet (0.4 mg total) under the tongue every 5 (five) minutes as needed.  25 tablet  6  . predniSONE (DELTASONE) 5 MG tablet Take 5 mg by mouth daily.      . Ranibizumab (LUCENTIS IO) 1 injection every 6 wks      . tamsulosin (FLOMAX) 0.4 MG CAPS capsule Take 1 capsule (0.4 mg total) by mouth daily.  30 capsule  0   No  current facility-administered medications on file prior to visit.    BP 120/84  Pulse 83  Temp(Src) 98.1 F (36.7 C) (Oral)  Wt 168 lb (76.204 kg)  BMI 23.44 kg/m2  SpO2 97%        Review of Systems  Constitutional: Negative for fever, chills, appetite change and fatigue.  HENT: Negative for congestion, dental problem, ear pain, hearing loss, sore throat, tinnitus, trouble swallowing and voice change.   Eyes: Negative for pain, discharge and visual disturbance.  Respiratory: Positive for cough. Negative for chest tightness, wheezing and stridor.   Cardiovascular: Negative for chest pain, palpitations and leg swelling.  Gastrointestinal: Negative for nausea, vomiting, abdominal pain, diarrhea, constipation, blood in stool and abdominal distention.  Genitourinary: Negative for urgency, hematuria, flank pain, discharge, difficulty urinating and genital sores.  Musculoskeletal: Negative for arthralgias, back pain, gait problem, joint swelling, myalgias and neck stiffness.  Skin: Positive for rash.       Easy bruisability  Neurological: Positive for headaches. Negative for dizziness, syncope, speech difficulty, weakness and numbness.  Hematological: Negative for adenopathy. Does not bruise/bleed easily.  Psychiatric/Behavioral: Positive for dysphoric mood. Negative for behavioral problems. The patient is nervous/anxious.        Objective:   Physical Exam  Constitutional: He is oriented to person, place, and time. He appears well-developed.  HENT:  Head: Normocephalic.  Right Ear: External ear normal.  Left Ear: External ear normal.  Eyes: Conjunctivae and EOM are normal.  Neck: Normal range of motion.  Cardiovascular: Normal rate and normal heart sounds.   Pulmonary/Chest: Breath sounds normal.  Abdominal: Bowel sounds are normal.  Musculoskeletal: Normal range of motion. He exhibits no edema and no tenderness.  Neurological: He is alert and oriented to person, place, and  time.  Psychiatric: He has a normal mood and affect. His behavior is normal.        Assessment & Plan:      PMR normal sed rate last month. Will continue prednisone. Rheumatology followup Cough secondary to upper airway cough syndrome GERD Anxiety disorder. Patient reassured. Will recheck in 6 months at

## 2013-01-12 ENCOUNTER — Ambulatory Visit (INDEPENDENT_AMBULATORY_CARE_PROVIDER_SITE_OTHER): Payer: Medicare Other | Admitting: Internal Medicine

## 2013-01-12 ENCOUNTER — Encounter: Payer: Self-pay | Admitting: Internal Medicine

## 2013-01-12 VITALS — BP 120/80 | HR 72 | Ht 71.0 in | Wt 172.0 lb

## 2013-01-12 DIAGNOSIS — R05 Cough: Secondary | ICD-10-CM | POA: Diagnosis not present

## 2013-01-12 NOTE — Progress Notes (Signed)
Subjective:    Patient ID: [name], male    DOB: 1923/02/01  MRN: [id number]    Brief patient profile:  46 yowm quit smoking 1973 and quite athletic through his senior years s limiting sob on chronic pred rx for PMR tapered to 5 mg daiy chronically @  new onset onset cough July 2014 initially eval  By ENT > ? gerd > eval by [name] >neg eval so referred 12/15/2012 to [facility]   History of Present Illness  12/15/2012 1st [name] office visit/ [name] cc abrupt onset cough July 2014 assoc with nasal congestion and facial pain prod min white mucus and sense that something is stuck in throat while on nexium 30 min ac qd and typically starts coughing daily p bfast but not at night and never before bfast  rec Try tessalon 200 mg up to every 4 hours for the sensation of throat tickle GERD diet   01/12/2013 f/u ov/[name] re: chronic cough Chief Complaint  Patient presents with  . Cough    Cough is improved since last OV.    Never used more than 3 days of tessalon  No obvious day to day or daytime variabilty or assoc sob or cp or chest tightness, subjective wheeze overt sinus or hb symptoms. No unusual exp hx or h/o childhood pna/ asthma or knowledge of premature birth.  Sleeping ok without nocturnal  or early am exacerbation  of respiratory  c/o's or need for noct saba. Also denies any obvious fluctuation of symptoms with weather or environmental changes or other aggravating or alleviating factors except as outlined above   Current Medications, Allergies, Complete Past Medical History, Past Surgical History, Family History, and Social History were reviewed in [employer] record.        [name] Reflux Comments  Do you awaken from a sound sleep coughing violently?                            With trouble breathing? no   Do you have choking episodes when you cannot  Get enough air, gasping for air ?              no   Do you usually cough when you lie down into  The bed, or when you just lie down to rest ?                          no   Do you usually cough after meals or eating?         Some now   Do you cough when (or after) you bend over?    no   GERD SCORE     [name] Neurogenic   Do you more-or-less cough all day long? yes   Does change of temperature make you cough? no   Does laughing or chuckling cause you to cough? no   Do fumes (perfume, automobile fumes, burned  Toast, etc.,) cause you to cough ?      no   Does speaking, singing, or talking on the phone cause you to cough   ?               No    Burgess/Airway score            Objective:   Physical Exam  amb wm nad  01/12/2013      wt 172  Wt Readings from Last 3 Encounters:  12/15/12 173 lb 6.4 oz (78.654 kg)  12/11/12 175 lb (79.379 kg)  12/06/12 173 lb 6.4 oz (78.654 kg)      HEENT: nl dentition, mod bilateral non-specific edema of turbinates, and orophanx. Nl external ear canals without cough reflex   NECK :  without JVD/Nodes/TM/ nl carotid upstrokes bilaterally   LUNGS: no acc muscle use, clear to A and P bilaterally without cough on insp or exp maneuvers   CV:  RRR  no s3 or murmur or increase in P2, no edema   ABD:  soft and nontender with nl excursion in the supine position. No bruits or organomegaly, bowel sounds nl  MS:  warm without deformities, calf tenderness, cyanosis or clubbing  SKIN: warm and dry without lesions    NEURO:  alert, approp, no deficits    CXR  12/15/2012 :  Hyperexpanded lungs and bronchitic change without acute cardiopulmonary disease.       Assessment & Plan:

## 2013-01-12 NOTE — Patient Instructions (Addendum)
Continue tessalon up to 4 hours to stop the urge to clear your throat  Add mucinex dm up to 1200 every 12 hours  GERD (REFLUX)  is an extremely common cause of respiratory symptoms, many times with no significant heartburn at all.    It can be treated with medication, but also with lifestyle changes including avoidance of late meals, excessive alcohol, smoking cessation, and avoid fatty foods, chocolate, peppermint, colas, red wine, and acidic juices such as orange juice.  NO MINT OR MENTHOL PRODUCTS SO NO COUGH DROPS  USE SUGARLESS CANDY INSTEAD (jolley ranchers or Stover's)  NO OIL BASED VITAMINS - use powdered substitutes - see if there's another alternative to Perservision since it is oil based but take as you eat.  If not satisfied try pepcid ac 20 mg one at bedtime with chlortrimeton 4 mg at bedtime to see what effect if any it has on your am cough    Pulmonary follow up is as needed

## 2013-01-13 NOTE — Assessment & Plan Note (Signed)
Classic Upper airway cough syndrome, so named because it's frequently impossible to sort out how much is  CR/sinusitis with freq throat clearing (which can be related to primary GERD)   vs  causing  secondary (" extra esophageal")  GERD from wide swings in gastric pressure that occur with throat clearing, often  promoting self use of mint and menthol lozenges that reduce the lower esophageal sphincter tone and exacerbate the problem further in a cyclical fashion.   These are the same pts (now being labeled as having "irritable larynx syndrome" by some cough centers) who not infrequently have a history of having failed to tolerate ace inhibitors,  dry powder inhalers or biphosphonates or report having atypical reflux symptoms that don't respond to standard doses of PPI , and are easily confused as having aecopd or asthma flares by even experienced allergists/ pulmonologists.   For now simply needs to avoid clearing his throat and perpetuating the cycle of coughing and has only scratched the surface with rx to date. Next step is to add pepcid and chlortrimeton at hs and pulmonary f/u can be prn (would add neurotin 100 mg tid as the 3rd step)

## 2013-01-22 ENCOUNTER — Other Ambulatory Visit: Payer: Self-pay | Admitting: Internal Medicine

## 2013-01-23 ENCOUNTER — Other Ambulatory Visit: Payer: Self-pay | Admitting: *Deleted

## 2013-01-23 MED ORDER — METOPROLOL SUCCINATE ER 25 MG PO TB24: 25.0000 mg | ORAL_TABLET | Freq: Every day | ORAL | Status: DC

## 2013-02-06 ENCOUNTER — Other Ambulatory Visit: Payer: Self-pay | Admitting: Internal Medicine

## 2013-02-19 ENCOUNTER — Other Ambulatory Visit: Payer: Self-pay | Admitting: Internal Medicine

## 2013-03-15 DIAGNOSIS — H353290 Exudative age-related macular degeneration, unspecified eye, stage unspecified: Secondary | ICD-10-CM | POA: Diagnosis not present

## 2013-03-26 DIAGNOSIS — R05 Cough: Secondary | ICD-10-CM | POA: Diagnosis not present

## 2013-03-26 DIAGNOSIS — K219 Gastro-esophageal reflux disease without esophagitis: Secondary | ICD-10-CM | POA: Diagnosis not present

## 2013-04-13 DIAGNOSIS — H5032 Intermittent alternating esotropia: Secondary | ICD-10-CM | POA: Diagnosis not present

## 2013-04-13 DIAGNOSIS — H518 Other specified disorders of binocular movement: Secondary | ICD-10-CM | POA: Diagnosis not present

## 2013-04-13 DIAGNOSIS — I772 Rupture of artery: Secondary | ICD-10-CM | POA: Diagnosis not present

## 2013-04-13 DIAGNOSIS — H47019 Ischemic optic neuropathy, unspecified eye: Secondary | ICD-10-CM | POA: Diagnosis not present

## 2013-04-13 DIAGNOSIS — Z961 Presence of intraocular lens: Secondary | ICD-10-CM | POA: Diagnosis not present

## 2013-04-19 DIAGNOSIS — H532 Diplopia: Secondary | ICD-10-CM | POA: Diagnosis not present

## 2013-04-19 DIAGNOSIS — M899 Disorder of bone, unspecified: Secondary | ICD-10-CM | POA: Diagnosis not present

## 2013-04-19 DIAGNOSIS — M949 Disorder of cartilage, unspecified: Secondary | ICD-10-CM | POA: Diagnosis not present

## 2013-04-19 DIAGNOSIS — M353 Polymyalgia rheumatica: Secondary | ICD-10-CM | POA: Diagnosis not present

## 2013-04-20 ENCOUNTER — Other Ambulatory Visit: Payer: Self-pay | Admitting: Internal Medicine

## 2013-04-23 DIAGNOSIS — R059 Cough, unspecified: Secondary | ICD-10-CM | POA: Diagnosis not present

## 2013-04-23 DIAGNOSIS — K219 Gastro-esophageal reflux disease without esophagitis: Secondary | ICD-10-CM | POA: Diagnosis not present

## 2013-04-23 DIAGNOSIS — R079 Chest pain, unspecified: Secondary | ICD-10-CM | POA: Diagnosis not present

## 2013-04-23 DIAGNOSIS — R05 Cough: Secondary | ICD-10-CM | POA: Diagnosis not present

## 2013-05-02 DIAGNOSIS — I772 Rupture of artery: Secondary | ICD-10-CM | POA: Diagnosis not present

## 2013-05-02 DIAGNOSIS — H532 Diplopia: Secondary | ICD-10-CM | POA: Diagnosis not present

## 2013-05-02 DIAGNOSIS — G501 Atypical facial pain: Secondary | ICD-10-CM | POA: Diagnosis not present

## 2013-05-02 DIAGNOSIS — G508 Other disorders of trigeminal nerve: Secondary | ICD-10-CM | POA: Diagnosis not present

## 2013-05-18 ENCOUNTER — Encounter: Payer: Self-pay | Admitting: Cardiology

## 2013-05-18 ENCOUNTER — Ambulatory Visit (INDEPENDENT_AMBULATORY_CARE_PROVIDER_SITE_OTHER): Payer: Medicare Other | Admitting: Cardiology

## 2013-05-18 VITALS — BP 136/70 | HR 61 | Ht 71.0 in | Wt 180.0 lb

## 2013-05-18 DIAGNOSIS — E785 Hyperlipidemia, unspecified: Secondary | ICD-10-CM | POA: Diagnosis not present

## 2013-05-18 DIAGNOSIS — J189 Pneumonia, unspecified organism: Secondary | ICD-10-CM | POA: Diagnosis not present

## 2013-05-18 DIAGNOSIS — I2510 Atherosclerotic heart disease of native coronary artery without angina pectoris: Secondary | ICD-10-CM | POA: Diagnosis not present

## 2013-05-18 NOTE — Progress Notes (Signed)
HPI The patient presents for followup. He is frustrated because he has not been feeling well. He has symptoms like he can't swallow things. He has what he describes as a constant cold sensation. He's been seen by ENT and GI. 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 This was found on MRI and treated by interventional radiology. However, he's not sure that this has made much difference in his pain symptom complex.  From a cardiac standpoint he does get some chest discomfort. This happens with agitation. He was told to double his antacid medication but it did not make a difference. He gets this sporadically. It doesn't seem to happen with activities such as walking. He does get some shortness of breath occasionally when he is walking. He doesn't have any PND or orthopnea. He says the discomfort that he does get is 3/10 in intensity and lasts for about 5 minutes going away spontaneously. He denies any palpitations, presyncope or syncope. He's had no weight gain or edema.  Allergies  Allergen Reactions  . Amoxicillin     REACTION: unspecified  . Doxycycline   . Erythromycin     REACTION: unspecified  . Penicillins     rash  . Sulfamethoxazole-Trimethoprim     unknown    Current Outpatient Prescriptions  Medication Sig Dispense Refill  . beta carotene w/minerals (OCUVITE) tablet Take 1 tablet by mouth daily.      Marland Kitchen omeprazole (PRILOSEC) 40 MG capsule Take 40 mg by mouth daily.      . predniSONE (DELTASONE) 5 MG tablet Take 5 mg by mouth daily.      Marland Kitchen aspirin 81 MG tablet Take 81 mg by mouth daily.      . benzonatate (TESSALON) 200 MG capsule TAKE ONE CAPSULE BY MOUTH THREE TIMES DAILY AS NEEDED FOR COUGH  30 capsule  0  . Co-Enzyme Q-10 100 MG CAPS Take 300 mg by mouth daily.      . cyanocobalamin 500 MCG tablet Take 500 mcg by mouth daily.      Marland Kitchen desonide (DESOWEN) 0.05 % ointment Apply 1 application topically as needed.      Marland Kitchen escitalopram (LEXAPRO) 5 MG tablet        . fluticasone (FLONASE) 50 MCG/ACT nasal spray       . folic acid (FOLVITE) 1 MG tablet Take 1 mg by mouth daily.      . metoprolol succinate (TOPROL-XL) 25 MG 24 hr tablet Take 1 tablet (25 mg total) by mouth daily.  90 tablet  1  . Multiple Vitamins-Minerals (PRESERVISION/LUTEIN PO) Take 1 tablet by mouth 2 (two) times daily.      Marland Kitchen NEXIUM 40 MG capsule Take 40 mg by mouth daily before breakfast.       . nitroGLYCERIN (NITROSTAT) 0.4 MG SL tablet Place 1 tablet (0.4 mg total) under the tongue every 5 (five) minutes as needed.  25 tablet  6  . Ranibizumab (LUCENTIS IO) 1 injection every 6 wks       No current facility-administered medications for this visit.    Past Medical History  Diagnosis Date  . IBS (irritable bowel syndrome)   . Hemorrhoids   . Diverticulosis   . GERD (gastroesophageal reflux disease)   . Tubular adenoma of colon 07/1998  . Anxiety and depression   . Elevated prostate specific antigen (PSA)   . Vitamin B12 deficiency   . UTI (lower urinary tract infection)   . Arthritis   .  Sciatica   . Anemia   . TIA (transient ischemic attack)   . Peptic ulcer disease   . Hyperlipidemia   . CAD (coronary artery disease)     PCI 2006, Stress Perfusion Study 2011  . Hypertension   . Rheumatoid arthritis(714.0)   . Polymyalgia     Past Surgical History  Procedure Laterality Date  . Ptca  2006    Stenting of proximal LAD w/ 75 % stenosis reduced to 0%  . Cataract extraction Bilateral   . Cholecystectomy  2001  . Hernia repair    . Hip fracture surgery  03/2007    Right  . Salivary stone removal  11/2007  . Radiology with anesthesia N/A 11/16/2012    Procedure: EMBOLIZATION  OF FISTULA;  Surgeon: Rob Hickman, MD;  Location: [facility];  Service: Radiology;  Laterality: N/A;  . Dural fistula embolition with onyx  12/07/12    ROS:  As stated in the HPI and negative for all other systems.  PHYSICAL EXAM BP 136/70  Pulse 61  Ht 5\' 11"  (1.803 m)  Wt 180 lb  (81.647 kg)  BMI 25.12 kg/m2 GENERAL:  Well appearing NECK:  No jugular venous distention, waveform within normal limits, carotid upstroke brisk and symmetric, no bruits, no thyromegaly LUNGS:  Clear to auscultation bilaterally BACK:  No CVA tenderness CHEST:  Unremarkable HEART:  PMI not displaced or sustained,S1 and S2 within normal limits, no S3, no S4, no clicks, no rubs, no murmurs ABD:  Flat, positive bowel sounds normal in frequency in pitch, no bruits, no rebound, no guarding, no midline pulsatile mass, no hepatomegaly, no splenomegaly EXT:  2 plus pulses throughout, no edema, no cyanosis no clubbing  EKG:  Sinus rhythm, rate 61, LVH by voltage criteria, no acute ST T wave changes.  05/18/2013  ASSESSMENT AND PLAN  CAD:   Given his symptoms described above I will bring the patient back for a POET (Plain Old Exercise Test). This will allow me to screen for obstructive coronary disease (and evaluate his previous stent), risk stratify and very importantly provide a prescription for exercise.  HYPERLIPIDEMIA:  I am going to check a lipid profile.  He has not been taking a statin because of joint pains.

## 2013-05-18 NOTE — Patient Instructions (Addendum)
The current medical regimen is effective;  continue present plan and medications.  Your physician has requested that you have an exercise tolerance test. For further information please visit [email or web address]. Please also follow instruction sheet, as given.  Please have fasting blood work the same day as your treadmill test.  Follow up in 6 months with Dr Percival Spanish.  You will receive a letter in the mail 2 months before you are due.  Please call us when you receive this letter to schedule your follow up appointment.

## 2013-05-26 ENCOUNTER — Other Ambulatory Visit: Payer: Self-pay | Admitting: Internal Medicine

## 2013-05-31 ENCOUNTER — Other Ambulatory Visit: Payer: Self-pay | Admitting: Internal Medicine

## 2013-05-31 DIAGNOSIS — Z961 Presence of intraocular lens: Secondary | ICD-10-CM | POA: Diagnosis not present

## 2013-05-31 DIAGNOSIS — H353190 Nonexudative age-related macular degeneration, unspecified eye, stage unspecified: Secondary | ICD-10-CM | POA: Diagnosis not present

## 2013-05-31 DIAGNOSIS — H353290 Exudative age-related macular degeneration, unspecified eye, stage unspecified: Secondary | ICD-10-CM | POA: Diagnosis not present

## 2013-05-31 DIAGNOSIS — H43819 Vitreous degeneration, unspecified eye: Secondary | ICD-10-CM | POA: Diagnosis not present

## 2013-06-08 ENCOUNTER — Other Ambulatory Visit (HOSPITAL_COMMUNITY): Payer: Self-pay | Admitting: Interventional Radiology

## 2013-06-08 DIAGNOSIS — I2510 Atherosclerotic heart disease of native coronary artery without angina pectoris: Secondary | ICD-10-CM

## 2013-06-08 DIAGNOSIS — G459 Transient cerebral ischemic attack, unspecified: Secondary | ICD-10-CM

## 2013-06-20 ENCOUNTER — Ambulatory Visit (INDEPENDENT_AMBULATORY_CARE_PROVIDER_SITE_OTHER): Payer: Medicare Other | Admitting: Physician Assistant

## 2013-06-20 ENCOUNTER — Other Ambulatory Visit (INDEPENDENT_AMBULATORY_CARE_PROVIDER_SITE_OTHER): Payer: Medicare Other

## 2013-06-20 DIAGNOSIS — E785 Hyperlipidemia, unspecified: Secondary | ICD-10-CM

## 2013-06-20 DIAGNOSIS — I2510 Atherosclerotic heart disease of native coronary artery without angina pectoris: Secondary | ICD-10-CM

## 2013-06-20 LAB — LIPID PANEL
CHOL/HDL RATIO: 6
Cholesterol: 209 mg/dL — ABNORMAL HIGH (ref 0–200)
HDL: 38 mg/dL — AB (ref >39.00)
LDL Cholesterol: 147 mg/dL — ABNORMAL HIGH (ref 0–99)
TRIGLYCERIDES: 122 mg/dL (ref 0.0–149.0)
VLDL: 24.4 mg/dL (ref 0.0–40.0)

## 2013-06-20 NOTE — Progress Notes (Signed)
Exercise Treadmill Test  Pre-Exercise Testing Evaluation Rhythm: normal sinus  Rate: 78 bpm     Test  Exercise Tolerance Test Ordering MD: Marijo File, MD  Interpreting MD: Richardson Dopp, PA-C  Unique Test No: 1  Treadmill:  1  Indication for ETT: known ASHD  Contraindication to ETT: No   Stress Modality: exercise - treadmill  Cardiac Imaging Performed: non   Protocol: modified Bruce - maximal  Max BP:  203/73  Max MPHR (bpm):  132 85% MPR (bpm):  112  MPHR obtained (bpm):  114 % MPHR obtained:  86  Reached 85% MPHR (min:sec):  6:30 Total Exercise Time (min-sec):  7:00  Workload in METS:  4.0 Borg Scale: 14  Reason ETT Terminated:  desired heart rate attained    ST Segment Analysis At Rest: normal ST segments - no evidence of significant ST depression With Exercise: non-specific ST changes  Other Information Arrhythmia:  No Angina during ETT:  absent (0) Quality of ETT:  diagnostic  ETT Interpretation:  normal - no evidence of ischemia by ST analysis  Comments: Good exercise capacity. Patient has difficulty with balance since dural fistula embolization procedure.  Therefore, we used Mod Bruce protocol. No chest pain. Normal BP response to exercise. No ST changes to suggest ischemia.   Recommendations: F/u with Dr. Minus Breeding as directed. Signed,  Richardson Dopp, PA-C   06/20/2013 11:19 AM

## 2013-06-25 ENCOUNTER — Ambulatory Visit (HOSPITAL_COMMUNITY)
Admission: RE | Admit: 2013-06-25 | Discharge: 2013-06-25 | Disposition: A | Discharge status: Home / Self Care | Payer: Medicare Other | Source: Ambulatory Visit | Attending: Interventional Radiology | Admitting: Interventional Radiology

## 2013-06-25 DIAGNOSIS — R51 Headache: Secondary | ICD-10-CM | POA: Diagnosis not present

## 2013-06-25 DIAGNOSIS — I2510 Atherosclerotic heart disease of native coronary artery without angina pectoris: Secondary | ICD-10-CM | POA: Diagnosis not present

## 2013-06-25 DIAGNOSIS — I6789 Other cerebrovascular disease: Secondary | ICD-10-CM | POA: Diagnosis not present

## 2013-06-25 DIAGNOSIS — G459 Transient cerebral ischemic attack, unspecified: Secondary | ICD-10-CM

## 2013-06-25 LAB — CREATININE, SERUM
CREATININE: 1.2 mg/dL (ref 0.50–1.35)
GFR calc Af Amer: 60 mL/min — ABNORMAL LOW (ref >90)
GFR calc non Af Amer: 52 mL/min — ABNORMAL LOW (ref >90)

## 2013-06-25 MED ORDER — GADOBENATE DIMEGLUMINE 529 MG/ML IV SOLN
18.0000 mL | Freq: Once | INTRAVENOUS | Status: AC
  Administered 2013-06-25: 18 mL via INTRAVENOUS

## 2013-06-26 ENCOUNTER — Telehealth (HOSPITAL_COMMUNITY): Payer: Self-pay | Admitting: Interventional Radiology

## 2013-06-26 ENCOUNTER — Other Ambulatory Visit (HOSPITAL_COMMUNITY): Payer: Self-pay | Admitting: Interventional Radiology

## 2013-06-26 DIAGNOSIS — I671 Cerebral aneurysm, nonruptured: Secondary | ICD-10-CM

## 2013-06-26 NOTE — Telephone Encounter (Signed)
Called pt's wife and made appt.for consult to discuss MRI results. JM

## 2013-06-29 ENCOUNTER — Ambulatory Visit (HOSPITAL_COMMUNITY)
Admission: RE | Admit: 2013-06-29 | Discharge: 2013-06-29 | Disposition: A | Discharge status: Home / Self Care | Payer: Medicare Other | Source: Ambulatory Visit | Attending: Interventional Radiology | Admitting: Interventional Radiology

## 2013-06-29 DIAGNOSIS — H9319 Tinnitus, unspecified ear: Secondary | ICD-10-CM | POA: Diagnosis not present

## 2013-06-29 DIAGNOSIS — I671 Cerebral aneurysm, nonruptured: Secondary | ICD-10-CM

## 2013-07-06 ENCOUNTER — Ambulatory Visit: Payer: Medicare Other | Admitting: Internal Medicine

## 2013-07-09 ENCOUNTER — Encounter: Payer: Self-pay | Admitting: Internal Medicine

## 2013-07-09 ENCOUNTER — Ambulatory Visit (INDEPENDENT_AMBULATORY_CARE_PROVIDER_SITE_OTHER): Payer: Medicare Other | Admitting: Internal Medicine

## 2013-07-09 VITALS — BP 118/76 | HR 67 | Temp 97.7°F | Resp 18 | Ht 71.0 in | Wt 175.0 lb

## 2013-07-09 DIAGNOSIS — M353 Polymyalgia rheumatica: Secondary | ICD-10-CM | POA: Diagnosis not present

## 2013-07-09 DIAGNOSIS — K219 Gastro-esophageal reflux disease without esophagitis: Secondary | ICD-10-CM | POA: Diagnosis not present

## 2013-07-09 DIAGNOSIS — I2510 Atherosclerotic heart disease of native coronary artery without angina pectoris: Secondary | ICD-10-CM

## 2013-07-09 DIAGNOSIS — E039 Hypothyroidism, unspecified: Secondary | ICD-10-CM

## 2013-07-09 NOTE — Progress Notes (Signed)
Pre-visit discussion using our clinic review tool. No additional management support is needed unless otherwise documented below in the visit note.  

## 2013-07-09 NOTE — Patient Instructions (Signed)
Limit your sodium (Salt) intake  Avoids foods high in acid such as tomatoes citrus juices, and spicy foods.  Avoid eating within two hours of lying down or before exercising.  Do not overheat.  Try smaller more frequent meals.  If symptoms persist, elevate the head of her bed 12 inches while sleeping.    It is important that you exercise regularly, at least 20 minutes 3 to 4 times per week.  If you develop chest pain or shortness of breath seek  medical attention.  Return in 6 months for follow-up

## 2013-07-09 NOTE — Progress Notes (Signed)
Subjective:    Patient ID: [name], male    DOB: [birth year], 80 y.o.   MRN: [id number]  HPI   is an 48-year-old patient who is seen today for followup.  He has history of coronary artery disease, which has been stable.  He has multiple somatic complaints today including mild headache, dyspepsia, and some minimal cough.  He does have a history of PMR and remains on prednisone 5 mg daily.  His last sedimentation rate was normal.  He complains of poor appetite, but there is been a nice weight gain over the past 6 months. He is contemplating returning to [facility]  within the next year  Past Medical History  Diagnosis Date  . IBS (irritable bowel syndrome)   . Hemorrhoids   . Diverticulosis   . GERD (gastroesophageal reflux disease)   . Tubular adenoma of colon 07/1998  . Anxiety and depression   . Elevated prostate specific antigen (PSA)   . Vitamin B12 deficiency   . UTI (lower urinary tract infection)   . Arthritis   . Sciatica   . Anemia   . TIA (transient ischemic attack)   . Peptic ulcer disease   . Hyperlipidemia   . CAD (coronary artery disease)     PCI 2006, Stress Perfusion Study 2011  . Hypertension   . Rheumatoid arthritis(714.0)   . Polymyalgia     History   Social History  . Marital Status: Married    Spouse Name: N/A    Number of Children: 1  . Years of Education: N/A   Occupational History  . Retired English as a second language teacher    Social History Main Topics  . Smoking status: Former Smoker -- 0.25 packs/day for 20 years    Types: Cigarettes    Quit date: 03/30/1971  . Smokeless tobacco: Never Used  . Alcohol Use: No     Comment: occasional  . Drug Use: No  . Sexual Activity: Not Currently   Other Topics Concern  . Not on file   Social History Narrative  . No narrative on file    Past Surgical History  Procedure Laterality Date  . Ptca  2006    Stenting of proximal LAD w/ 75 % stenosis reduced to 0%  . Cataract extraction Bilateral   . Cholecystectomy  2001    . Hernia repair    . Hip fracture surgery  03/2007    Right  . Salivary stone removal  11/2007  . Radiology with anesthesia N/A 11/16/2012    Procedure: EMBOLIZATION  OF FISTULA;  Surgeon: Rob Hickman, MD;  Location: [facility];  Service: Radiology;  Laterality: N/A;  . 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 Colon cancer Maternal Uncle 24    ish  . Coronary artery disease Father     Allergies  Allergen Reactions  . Amoxicillin     REACTION: unspecified  . Doxycycline   . Erythromycin     REACTION: unspecified  . Penicillins     rash  . Sulfamethoxazole-Trimethoprim     unknown    Current Outpatient Prescriptions on File Prior to Visit  Medication Sig Dispense Refill  . aspirin 81 MG tablet Take 81 mg by mouth daily.      . beta carotene w/minerals (OCUVITE) tablet Take 1 tablet by mouth daily.      . cyanocobalamin 500 MCG tablet Take 1,000 mcg by mouth daily.       Marland Kitchen  GuaiFENesin (MUCINEX PO) Take 600 mg by mouth as needed.      . metoprolol succinate (TOPROL-XL) 25 MG 24 hr tablet Take 1 tablet (25 mg total) by mouth daily.  90 tablet  1  . nitroGLYCERIN (NITROSTAT) 0.4 MG SL tablet Place 1 tablet (0.4 mg total) under the tongue every 5 (five) minutes as needed.  25 tablet  6  . omeprazole (PRILOSEC) 40 MG capsule Take 40 mg by mouth 2 (two) times daily.       . predniSONE (DELTASONE) 5 MG tablet Take 5 mg by mouth daily.       No current facility-administered medications on file prior to visit.    BP 118/76  Pulse 67  Temp(Src) 97.7 F (36.5 C) (Oral)  Resp 18  Ht 5\' 11"  (1.803 m)  Wt 175 lb (79.379 kg)  BMI 24.42 kg/m2  SpO2 97%      Review of Systems  Constitutional: Positive for appetite change and fatigue. Negative for fever and chills.  HENT: Negative for congestion, dental problem, ear pain, hearing loss, sore throat, tinnitus, trouble swallowing and voice change.   Eyes: Negative for pain,  discharge and visual disturbance.  Respiratory: Negative for cough, chest tightness, wheezing and stridor.   Cardiovascular: Negative for chest pain, palpitations and leg swelling.  Gastrointestinal: Negative for nausea, vomiting, abdominal pain, diarrhea, constipation, blood in stool and abdominal distention.  Genitourinary: Negative for urgency, hematuria, flank pain, discharge, difficulty urinating and genital sores.  Musculoskeletal: Negative for arthralgias, back pain, gait problem, joint swelling, myalgias and neck stiffness.  Skin: Negative for rash.  Neurological: Positive for headaches. Negative for dizziness, syncope, speech difficulty, weakness and numbness.  Hematological: Negative for adenopathy. Does not bruise/bleed easily.  Psychiatric/Behavioral: Negative for behavioral problems and dysphoric mood. The patient is not nervous/anxious.        Objective:   Physical Exam  Constitutional: He is oriented to person, place, and time. He appears well-developed.  HENT:  Head: Normocephalic.  Right Ear: External ear normal.  Left Ear: External ear normal.  Eyes: Conjunctivae and EOM are normal.  Neck: Normal range of motion.  Cardiovascular: Normal rate and normal heart sounds.   Pulmonary/Chest: Breath sounds normal.  Abdominal: Bowel sounds are normal.  Musculoskeletal: Normal range of motion. He exhibits no edema and no tenderness.  Neurological: He is alert and oriented to person, place, and time.  Psychiatric: He has a normal mood and affect. His behavior is normal.          Assessment & Plan:   Polymyalgia rheumatica.  We'll check a sedimentation rate.  We'll consider a dose reduction of the prednisone. Hypertension stable Coronary artery disease, stable GERD.  Continue PPI therapy  CPX 6 months

## 2013-08-07 ENCOUNTER — Other Ambulatory Visit: Payer: Self-pay

## 2013-08-07 MED ORDER — METOPROLOL SUCCINATE ER 25 MG PO TB24: 25.0000 mg | ORAL_TABLET | Freq: Every day | ORAL | Status: DC

## 2013-08-16 DIAGNOSIS — H43819 Vitreous degeneration, unspecified eye: Secondary | ICD-10-CM | POA: Diagnosis not present

## 2013-08-16 DIAGNOSIS — Z961 Presence of intraocular lens: Secondary | ICD-10-CM | POA: Diagnosis not present

## 2013-08-16 DIAGNOSIS — H353190 Nonexudative age-related macular degeneration, unspecified eye, stage unspecified: Secondary | ICD-10-CM | POA: Diagnosis not present

## 2013-08-16 DIAGNOSIS — H353290 Exudative age-related macular degeneration, unspecified eye, stage unspecified: Secondary | ICD-10-CM | POA: Diagnosis not present

## 2013-08-21 DIAGNOSIS — H532 Diplopia: Secondary | ICD-10-CM | POA: Diagnosis not present

## 2013-08-21 DIAGNOSIS — H353290 Exudative age-related macular degeneration, unspecified eye, stage unspecified: Secondary | ICD-10-CM | POA: Diagnosis not present

## 2013-08-21 DIAGNOSIS — H5032 Intermittent alternating esotropia: Secondary | ICD-10-CM | POA: Diagnosis not present

## 2013-08-21 DIAGNOSIS — H518 Other specified disorders of binocular movement: Secondary | ICD-10-CM | POA: Diagnosis not present

## 2013-08-21 DIAGNOSIS — H47019 Ischemic optic neuropathy, unspecified eye: Secondary | ICD-10-CM | POA: Diagnosis not present

## 2013-09-25 ENCOUNTER — Ambulatory Visit (INDEPENDENT_AMBULATORY_CARE_PROVIDER_SITE_OTHER): Payer: Medicare Other | Admitting: Internal Medicine

## 2013-09-25 ENCOUNTER — Encounter: Payer: Self-pay | Admitting: Internal Medicine

## 2013-09-25 VITALS — BP 126/80 | HR 69 | Temp 97.9°F | Resp 20 | Ht 71.0 in | Wt 171.0 lb

## 2013-09-25 DIAGNOSIS — M353 Polymyalgia rheumatica: Secondary | ICD-10-CM

## 2013-09-25 DIAGNOSIS — F341 Dysthymic disorder: Secondary | ICD-10-CM | POA: Diagnosis not present

## 2013-09-25 DIAGNOSIS — I2510 Atherosclerotic heart disease of native coronary artery without angina pectoris: Secondary | ICD-10-CM | POA: Diagnosis not present

## 2013-09-25 DIAGNOSIS — D500 Iron deficiency anemia secondary to blood loss (chronic): Secondary | ICD-10-CM

## 2013-09-25 DIAGNOSIS — L089 Local infection of the skin and subcutaneous tissue, unspecified: Secondary | ICD-10-CM

## 2013-09-25 DIAGNOSIS — E039 Hypothyroidism, unspecified: Secondary | ICD-10-CM

## 2013-09-25 DIAGNOSIS — L988 Other specified disorders of the skin and subcutaneous tissue: Secondary | ICD-10-CM

## 2013-09-25 LAB — CBC WITH DIFFERENTIAL/PLATELET
BASOS ABS: 0.1 10*3/uL (ref 0.0–0.1)
Basophils Relative: 1 % (ref 0.0–3.0)
EOS ABS: 0.1 10*3/uL (ref 0.0–0.7)
Eosinophils Relative: 1.4 % (ref 0.0–5.0)
HCT: 42.1 % (ref 39.0–52.0)
HEMOGLOBIN: 14 g/dL (ref 13.0–17.0)
LYMPHS PCT: 16.3 % (ref 12.0–46.0)
Lymphs Abs: 1.1 10*3/uL (ref 0.7–4.0)
MCHC: 33.2 g/dL (ref 30.0–36.0)
MCV: 91.7 fl (ref 78.0–100.0)
MONOS PCT: 5.1 % (ref 3.0–12.0)
Monocytes Absolute: 0.3 10*3/uL (ref 0.1–1.0)
NEUTROS PCT: 76.2 % (ref 43.0–77.0)
Neutro Abs: 5 10*3/uL (ref 1.4–7.7)
Platelets: 153 10*3/uL (ref 150.0–400.0)
RBC: 4.59 Mil/uL (ref 4.22–5.81)
RDW: 13.8 % (ref 11.5–15.5)
WBC: 6.6 10*3/uL (ref 4.0–10.5)

## 2013-09-25 LAB — COMPREHENSIVE METABOLIC PANEL
ALT: 16 U/L (ref 0–53)
AST: 20 U/L (ref 0–37)
Albumin: 3.9 g/dL (ref 3.5–5.2)
Alkaline Phosphatase: 71 U/L (ref 39–117)
BILIRUBIN TOTAL: 0.5 mg/dL (ref 0.2–1.2)
BUN: 24 mg/dL — ABNORMAL HIGH (ref 6–23)
CALCIUM: 9.4 mg/dL (ref 8.4–10.5)
CHLORIDE: 105 meq/L (ref 96–112)
CO2: 28 meq/L (ref 19–32)
CREATININE: 1.3 mg/dL (ref 0.4–1.5)
GFR: 56.21 mL/min — AB (ref >60.00)
GLUCOSE: 91 mg/dL (ref 70–99)
Potassium: 4.5 mEq/L (ref 3.5–5.1)
SODIUM: 140 meq/L (ref 135–145)
TOTAL PROTEIN: 6.9 g/dL (ref 6.0–8.3)

## 2013-09-25 LAB — SEDIMENTATION RATE: SED RATE: 8 mm/h (ref 0–22)

## 2013-09-25 LAB — TSH: TSH: 1.23 u[IU]/mL (ref 0.35–4.50)

## 2013-09-25 MED ORDER — FIRST-DUKES MOUTHWASH MT SUSP: OROMUCOSAL | Status: DC

## 2013-09-25 NOTE — Progress Notes (Signed)
Subjective:    Patient ID: [name], male    DOB: [birth year], 80 y.o.   MRN: [id number]  HPI  80 year old patient, who presents today with a chief complaint of intermittent burning involving his distal tongue.  He describes a general sense of unwellness, poor appetite, and there has been some documented weight loss. He is status post embolization of a dural AV fistula in August of last year.  He does have a history of polymyalgia rheumatica and has been on titrated his prednisone to 3 mg daily.  He was concerned about easy bruisability of the skin.  He does have a history of anxiety or depression. He also has a history of hypothyroidism, anemia.  Past Medical History  Diagnosis Date  . IBS (irritable bowel syndrome)   . Hemorrhoids   . Diverticulosis   . GERD (gastroesophageal reflux disease)   . Tubular adenoma of colon 07/1998  . Anxiety and depression   . Elevated prostate specific antigen (PSA)   . Vitamin B12 deficiency   . UTI (lower urinary tract infection)   . Arthritis   . Sciatica   . Anemia   . TIA (transient ischemic attack)   . Peptic ulcer disease   . Hyperlipidemia   . CAD (coronary artery disease)     PCI 2006, Stress Perfusion Study 2011  . Hypertension   . Rheumatoid arthritis(714.0)   . Polymyalgia     History   Social History  . Marital Status: Married    Spouse Name: N/A    Number of Children: 1  . Years of Education: N/A   Occupational History  . Retired English as a second language teacher    Social History Main Topics  . Smoking status: Former Smoker -- 0.25 packs/day for 20 years    Types: Cigarettes    Quit date: 03/30/1971  . Smokeless tobacco: Never Used  . Alcohol Use: No     Comment: occasional  . Drug Use: No  . Sexual Activity: Not Currently   Other Topics Concern  . Not on file   Social History Narrative  . No narrative on file    Past Surgical History  Procedure Laterality Date  . Ptca  2006    Stenting of proximal LAD w/ 75 % stenosis reduced  to 0%  . Cataract extraction Bilateral   . Cholecystectomy  2001  . Hernia repair    . Hip fracture surgery  03/2007    Right  . Salivary stone removal  11/2007  . Radiology with anesthesia N/A 11/16/2012    Procedure: EMBOLIZATION  OF FISTULA;  Surgeon: Rob Hickman, MD;  Location: [facility];  Service: Radiology;  Laterality: N/A;  . 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 Colon cancer Maternal Uncle 57    ish  . Coronary artery disease Father     Allergies  Allergen Reactions  . Amoxicillin     REACTION: unspecified  . Doxycycline   . Erythromycin     REACTION: unspecified  . Penicillins     rash  . Sulfamethoxazole-Trimethoprim     unknown    Current Outpatient Prescriptions on File Prior to Visit  Medication Sig Dispense Refill  . aspirin 81 MG tablet Take 81 mg by mouth daily.      . GuaiFENesin (MUCINEX PO) Take 600 mg by mouth as needed.      . metoprolol succinate (TOPROL-XL) 25 MG 24 hr tablet  Take 1 tablet (25 mg total) by mouth daily.  90 tablet  1  . nitroGLYCERIN (NITROSTAT) 0.4 MG SL tablet Place 1 tablet (0.4 mg total) under the tongue every 5 (five) minutes as needed.  25 tablet  6  . omeprazole (PRILOSEC) 40 MG capsule Take 40 mg by mouth 2 (two) times daily.        No current facility-administered medications on file prior to visit.    BP 126/80  Pulse 69  Temp(Src) 97.9 F (36.6 C) (Oral)  Resp 20  Ht 5\' 11"  (1.803 m)  Wt 171 lb (77.565 kg)  BMI 23.86 kg/m2  SpO2 98%      Review of Systems  Constitutional: Positive for activity change, appetite change and unexpected weight change. Negative for fever, chills and fatigue.  HENT: Negative for congestion, dental problem, ear pain, hearing loss, sore throat, tinnitus, trouble swallowing and voice change.   Eyes: Negative for pain, discharge and visual disturbance.  Respiratory: Negative for cough, chest tightness, wheezing and stridor.     Cardiovascular: Negative for chest pain, palpitations and leg swelling.  Gastrointestinal: Negative for nausea, vomiting, abdominal pain, diarrhea, constipation, blood in stool and abdominal distention.  Genitourinary: Negative for urgency, hematuria, flank pain, discharge, difficulty urinating and genital sores.  Musculoskeletal: Negative for arthralgias, back pain, gait problem, joint swelling, myalgias and neck stiffness.  Skin: Negative for rash.  Neurological: Positive for weakness. Negative for dizziness, syncope, speech difficulty, numbness and headaches.  Hematological: Negative for adenopathy. Does not bruise/bleed easily.  Psychiatric/Behavioral: Negative for behavioral problems and dysphoric mood. The patient is not nervous/anxious.        Objective:   Physical Exam  Constitutional: He is oriented to person, place, and time. He appears well-developed.  Pressure 110 over 64  HENT:  Head: Normocephalic.  Right Ear: External ear normal.  Left Ear: External ear normal.  Eyes: Conjunctivae and EOM are normal.  Neck: Normal range of motion.  Cardiovascular: Normal rate and normal heart sounds.   Pulmonary/Chest: Breath sounds normal.  Abdominal: Bowel sounds are normal.  Musculoskeletal: Normal range of motion. He exhibits no edema and no tenderness.  Neurological: He is alert and oriented to person, place, and time.  Psychiatric: He has a normal mood and affect. His behavior is normal.          Assessment & Plan:   Polymyalgia rheumatica.  The patient has decreased his daily dose from 5 mg to 3 mg.  We'll check a sedimentation rate today Weight loss.  We'll check some screening labs including TSH and CBC Coronary artery disease, stable Status post embolization of a dural AV fistula Hypothyroidism.  We'll check a TSH

## 2013-09-25 NOTE — Progress Notes (Signed)
Pre visit review using our clinic review tool, if applicable. No additional management support is needed unless otherwise documented below in the visit note. 

## 2013-09-25 NOTE — Patient Instructions (Signed)
Use mouthwash as directed  Continue present medications

## 2013-10-18 DIAGNOSIS — E559 Vitamin D deficiency, unspecified: Secondary | ICD-10-CM | POA: Diagnosis not present

## 2013-10-18 DIAGNOSIS — H532 Diplopia: Secondary | ICD-10-CM | POA: Diagnosis not present

## 2013-10-18 DIAGNOSIS — M899 Disorder of bone, unspecified: Secondary | ICD-10-CM | POA: Diagnosis not present

## 2013-10-18 DIAGNOSIS — M353 Polymyalgia rheumatica: Secondary | ICD-10-CM | POA: Diagnosis not present

## 2013-10-18 DIAGNOSIS — M949 Disorder of cartilage, unspecified: Secondary | ICD-10-CM | POA: Diagnosis not present

## 2013-10-24 ENCOUNTER — Other Ambulatory Visit: Payer: Self-pay | Admitting: Dermatology

## 2013-10-24 DIAGNOSIS — L820 Inflamed seborrheic keratosis: Secondary | ICD-10-CM | POA: Diagnosis not present

## 2013-10-24 DIAGNOSIS — L819 Disorder of pigmentation, unspecified: Secondary | ICD-10-CM | POA: Diagnosis not present

## 2013-10-24 DIAGNOSIS — D485 Neoplasm of uncertain behavior of skin: Secondary | ICD-10-CM | POA: Diagnosis not present

## 2013-10-24 DIAGNOSIS — L219 Seborrheic dermatitis, unspecified: Secondary | ICD-10-CM | POA: Diagnosis not present

## 2013-10-25 DIAGNOSIS — H353190 Nonexudative age-related macular degeneration, unspecified eye, stage unspecified: Secondary | ICD-10-CM | POA: Diagnosis not present

## 2013-10-25 DIAGNOSIS — H43819 Vitreous degeneration, unspecified eye: Secondary | ICD-10-CM | POA: Diagnosis not present

## 2013-10-25 DIAGNOSIS — Z961 Presence of intraocular lens: Secondary | ICD-10-CM | POA: Diagnosis not present

## 2013-10-25 DIAGNOSIS — H353290 Exudative age-related macular degeneration, unspecified eye, stage unspecified: Secondary | ICD-10-CM | POA: Diagnosis not present

## 2013-11-12 ENCOUNTER — Ambulatory Visit (INDEPENDENT_AMBULATORY_CARE_PROVIDER_SITE_OTHER): Payer: Medicare Other | Admitting: Family

## 2013-11-12 ENCOUNTER — Encounter: Payer: Self-pay | Admitting: Family

## 2013-11-12 VITALS — BP 150/70 | HR 86 | Temp 97.7°F | Ht 71.0 in | Wt 178.0 lb

## 2013-11-12 DIAGNOSIS — T148XXA Other injury of unspecified body region, initial encounter: Secondary | ICD-10-CM

## 2013-11-12 DIAGNOSIS — I2510 Atherosclerotic heart disease of native coronary artery without angina pectoris: Secondary | ICD-10-CM

## 2013-11-12 DIAGNOSIS — M5430 Sciatica, unspecified side: Secondary | ICD-10-CM | POA: Diagnosis not present

## 2013-11-12 MED ORDER — CYCLOBENZAPRINE HCL 5 MG PO TABS: 5.0000 mg | ORAL_TABLET | Freq: Three times a day (TID) | ORAL | Status: DC | PRN

## 2013-11-12 NOTE — Patient Instructions (Signed)
Sciatica with Rehab The sciatic nerve runs from the back down the leg and is responsible for sensation and control of the muscles in the back (posterior) side of the thigh, lower leg, and foot. Sciatica is a condition that is characterized by inflammation of this nerve.  SYMPTOMS   Signs of nerve damage, including numbness and/or weakness along the posterior side of the lower extremity.  Pain in the back of the thigh that may also travel down the leg.  Pain that worsens when sitting for long periods of time.  Occasionally, pain in the back or buttock. CAUSES  Inflammation of the sciatic nerve is the cause of sciatica. The inflammation is due to something irritating the nerve. Common sources of irritation include:  Sitting for long periods of time.  Direct trauma to the nerve.  Arthritis of the spine.  Herniated or ruptured disk.  Slipping of the vertebrae (spondylolisthesis).  Pressure from soft tissues, such as muscles or ligament-like tissue (fascia). RISK INCREASES WITH:  Sports that place pressure or stress on the spine (football or weightlifting).  Poor strength and flexibility.  Failure to warm up properly before activity.  Family history of low back pain or disk disorders.  Previous back injury or surgery.  Poor body mechanics, especially when lifting, or poor posture. PREVENTION   Warm up and stretch properly before activity.  Maintain physical fitness:  Strength, flexibility, and endurance.  Cardiovascular fitness.  Learn and use proper technique, especially with posture and lifting. When possible, have coach correct improper technique.  Avoid activities that place stress on the spine. PROGNOSIS If treated properly, then sciatica usually resolves within 6 weeks. However, occasionally surgery is necessary.  RELATED COMPLICATIONS   Permanent nerve damage, including pain, numbness, tingle, or weakness.  Chronic back pain.  Risks of surgery: infection,  bleeding, nerve damage, or damage to surrounding tissues. TREATMENT Treatment initially involves resting from any activities that aggravate your symptoms. The use of ice and medication may help reduce pain and inflammation. The use of strengthening and stretching exercises may help reduce pain with activity. These exercises may be performed at home or with referral to a therapist. A therapist may recommend further treatments, such as transcutaneous electronic nerve stimulation (TENS) or ultrasound. Your caregiver may recommend corticosteroid injections to help reduce inflammation of the sciatic nerve. If symptoms persist despite non-surgical (conservative) treatment, then surgery may be recommended. MEDICATION  If pain medication is necessary, then nonsteroidal anti-inflammatory medications, such as aspirin and ibuprofen, or other minor pain relievers, such as acetaminophen, are often recommended.  Do not take pain medication for 7 days before surgery.  Prescription pain relievers may be given if deemed necessary by your caregiver. Use only as directed and only as much as you need.  Ointments applied to the skin may be helpful.  Corticosteroid injections may be given by your caregiver. These injections should be reserved for the most serious cases, because they may only be given a certain number of times. HEAT AND COLD  Cold treatment (icing) relieves pain and reduces inflammation. Cold treatment should be applied for 10 to 15 minutes every 2 to 3 hours for inflammation and pain and immediately after any activity that aggravates your symptoms. Use ice packs or massage the area with a piece of ice (ice massage).  Heat treatment may be used prior to performing the stretching and strengthening activities prescribed by your caregiver, physical therapist, or athletic trainer. Use a heat pack or soak the injury in warm water.   SEEK MEDICAL CARE IF:  Treatment seems to offer no benefit, or the condition  worsens.  Any medications produce adverse side effects. EXERCISES  RANGE OF MOTION (ROM) AND STRETCHING EXERCISES - Sciatica Most people with sciatic will find that their symptoms worsen with either excessive bending forward (flexion) or arching at the low back (extension). The exercises which will help resolve your symptoms will focus on the opposite motion. Your physician, physical therapist or athletic trainer will help you determine which exercises will be most helpful to resolve your low back pain. Do not complete any exercises without first consulting with your clinician. Discontinue any exercises which worsen your symptoms until you speak to your clinician. If you have pain, numbness or tingling which travels down into your buttocks, leg or foot, the goal of the therapy is for these symptoms to move closer to your back and eventually resolve. Occasionally, these leg symptoms will get better, but your low back pain may worsen; this is typically an indication of progress in your rehabilitation. Be certain to be very alert to any changes in your symptoms and the activities in which you participated in the 24 hours prior to the change. Sharing this information with your clinician will allow him/her to most efficiently treat your condition. These exercises may help you when beginning to rehabilitate your injury. Your symptoms may resolve with or without further involvement from your physician, physical therapist or athletic trainer. While completing these exercises, remember:   Restoring tissue flexibility helps normal motion to return to the joints. This allows healthier, less painful movement and activity.  An effective stretch should be held for at least 30 seconds.  A stretch should never be painful. You should only feel a gentle lengthening or release in the stretched tissue. FLEXION RANGE OF MOTION AND STRETCHING EXERCISES: STRETCH - Flexion, Single Knee to Chest   Lie on a firm bed or floor  with both legs extended in front of you.  Keeping one leg in contact with the floor, bring your opposite knee to your chest. Hold your leg in place by either grabbing behind your thigh or at your knee.  Pull until you feel a gentle stretch in your low back. Hold __________ seconds.  Slowly release your grasp and repeat the exercise with the opposite side. Repeat __________ times. Complete this exercise __________ times per day.  STRETCH - Flexion, Double Knee to Chest  Lie on a firm bed or floor with both legs extended in front of you.  Keeping one leg in contact with the floor, bring your opposite knee to your chest.  Tense your stomach muscles to support your back and then lift your other knee to your chest. Hold your legs in place by either grabbing behind your thighs or at your knees.  Pull both knees toward your chest until you feel a gentle stretch in your low back. Hold __________ seconds.  Tense your stomach muscles and slowly return one leg at a time to the floor. Repeat __________ times. Complete this exercise __________ times per day.  STRETCH - Low Trunk Rotation   Lie on a firm bed or floor. Keeping your legs in front of you, bend your knees so they are both pointed toward the ceiling and your feet are flat on the floor.  Extend your arms out to the side. This will stabilize your upper body by keeping your shoulders in contact with the floor.  Gently and slowly drop both knees together to one side until   you feel a gentle stretch in your low back. Hold for __________ seconds.  Tense your stomach muscles to support your low back as you bring your knees back to the starting position. Repeat the exercise to the other side. Repeat __________ times. Complete this exercise __________ times per day  EXTENSION RANGE OF MOTION AND FLEXIBILITY EXERCISES: STRETCH - Extension, Prone on Elbows  Lie on your stomach on the floor, a bed will be too soft. Place your palms about shoulder  width apart and at the height of your head.  Place your elbows under your shoulders. If this is too painful, stack pillows under your chest.  Allow your body to relax so that your hips drop lower and make contact more completely with the floor.  Hold this position for __________ seconds.  Slowly return to lying flat on the floor. Repeat __________ times. Complete this exercise __________ times per day.  RANGE OF MOTION - Extension, Prone Press Ups  Lie on your stomach on the floor, a bed will be too soft. Place your palms about shoulder width apart and at the height of your head.  Keeping your back as relaxed as possible, slowly straighten your elbows while keeping your hips on the floor. You may adjust the placement of your hands to maximize your comfort. As you gain motion, your hands will come more underneath your shoulders.  Hold this position __________ seconds.  Slowly return to lying flat on the floor. Repeat __________ times. Complete this exercise __________ times per day.  STRENGTHENING EXERCISES - Sciatica  These exercises may help you when beginning to rehabilitate your injury. These exercises should be done near your "sweet spot." This is the neutral, low-back arch, somewhere between fully rounded and fully arched, that is your least painful position. When performed in this safe range of motion, these exercises can be used for people who have either a flexion or extension based injury. These exercises may resolve your symptoms with or without further involvement from your physician, physical therapist or athletic trainer. While completing these exercises, remember:   Muscles can gain both the endurance and the strength needed for everyday activities through controlled exercises.  Complete these exercises as instructed by your physician, physical therapist or athletic trainer. Progress with the resistance and repetition exercises only as your caregiver advises.  You may  experience muscle soreness or fatigue, but the pain or discomfort you are trying to eliminate should never worsen during these exercises. If this pain does worsen, stop and make certain you are following the directions exactly. If the pain is still present after adjustments, discontinue the exercise until you can discuss the trouble with your clinician. STRENGTHENING - Deep Abdominals, Pelvic Tilt   Lie on a firm bed or floor. Keeping your legs in front of you, bend your knees so they are both pointed toward the ceiling and your feet are flat on the floor.  Tense your lower abdominal muscles to press your low back into the floor. This motion will rotate your pelvis so that your tail bone is scooping upwards rather than pointing at your feet or into the floor.  With a gentle tension and even breathing, hold this position for __________ seconds. Repeat __________ times. Complete this exercise __________ times per day.  STRENGTHENING - Abdominals, Crunches   Lie on a firm bed or floor. Keeping your legs in front of you, bend your knees so they are both pointed toward the ceiling and your feet are flat on the   floor. Cross your arms over your chest.  Slightly tip your chin down without bending your neck.  Tense your abdominals and slowly lift your trunk high enough to just clear your shoulder blades. Lifting higher can put excessive stress on the low back and does not further strengthen your abdominal muscles.  Control your return to the starting position. Repeat __________ times. Complete this exercise __________ times per day.  STRENGTHENING - Quadruped, Opposite UE/LE Lift  Assume a hands and knees position on a firm surface. Keep your hands under your shoulders and your knees under your hips. You may place padding under your knees for comfort.  Find your neutral spine and gently tense your abdominal muscles so that you can maintain this position. Your shoulders and hips should form a rectangle  that is parallel with the floor and is not twisted.  Keeping your trunk steady, lift your right hand no higher than your shoulder and then your left leg no higher than your hip. Make sure you are not holding your breath. Hold this position __________ seconds.  Continuing to keep your abdominal muscles tense and your back steady, slowly return to your starting position. Repeat with the opposite arm and leg. Repeat __________ times. Complete this exercise __________ times per day.  STRENGTHENING - Abdominals and Quadriceps, Straight Leg Raise   Lie on a firm bed or floor with both legs extended in front of you.  Keeping one leg in contact with the floor, bend the other knee so that your foot can rest flat on the floor.  Find your neutral spine, and tense your abdominal muscles to maintain your spinal position throughout the exercise.  Slowly lift your straight leg off the floor about 6 inches for a count of 15, making sure to not hold your breath.  Still keeping your neutral spine, slowly lower your leg all the way to the floor. Repeat this exercise with each leg __________ times. Complete this exercise __________ times per day. POSTURE AND BODY MECHANICS CONSIDERATIONS - Sciatica Keeping correct posture when sitting, standing or completing your activities will reduce the stress put on different body tissues, allowing injured tissues a chance to heal and limiting painful experiences. The following are general guidelines for improved posture. Your physician or physical therapist will provide you with any instructions specific to your needs. While reading these guidelines, remember:  The exercises prescribed by your provider will help you have the flexibility and strength to maintain correct postures.  The correct posture provides the optimal environment for your joints to work. All of your joints have less wear and tear when properly supported by a spine with good posture. This means you will  experience a healthier, less painful body.  Correct posture must be practiced with all of your activities, especially prolonged sitting and standing. Correct posture is as important when doing repetitive low-stress activities (typing) as it is when doing a single heavy-load activity (lifting). RESTING POSITIONS Consider which positions are most painful for you when choosing a resting position. If you have pain with flexion-based activities (sitting, bending, stooping, squatting), choose a position that allows you to rest in a less flexed posture. You would want to avoid curling into a fetal position on your side. If your pain worsens with extension-based activities (prolonged standing, working overhead), avoid resting in an extended position such as sleeping on your stomach. Most people will find more comfort when they rest with their spine in a more neutral position, neither too rounded nor too   arched. Lying on a non-sagging bed on your side with a pillow between your knees, or on your back with a pillow under your knees will often provide some relief. Keep in mind, being in any one position for a prolonged period of time, no matter how correct your posture, can still lead to stiffness. PROPER SITTING POSTURE In order to minimize stress and discomfort on your spine, you must sit with correct posture Sitting with good posture should be effortless for a healthy body. Returning to good posture is a gradual process. Many people can work toward this most comfortably by using various supports until they have the flexibility and strength to maintain this posture on their own. When sitting with proper posture, your ears will fall over your shoulders and your shoulders will fall over your hips. You should use the back of the chair to support your upper back. Your low back will be in a neutral position, just slightly arched. You may place a small pillow or folded towel at the base of your low back for support.  When  working at a desk, create an environment that supports good, upright posture. Without extra support, muscles fatigue and lead to excessive strain on joints and other tissues. Keep these recommendations in mind: CHAIR:   A chair should be able to slide under your desk when your back makes contact with the back of the chair. This allows you to work closely.  The chair's height should allow your eyes to be level with the upper part of your monitor and your hands to be slightly lower than your elbows. BODY POSITION  Your feet should make contact with the floor. If this is not possible, use a foot rest.  Keep your ears over your shoulders. This will reduce stress on your neck and low back. INCORRECT SITTING POSTURES   If you are feeling tired and unable to assume a healthy sitting posture, do not slouch or slump. This puts excessive strain on your back tissues, causing more damage and pain. Healthier options include:  Using more support, like a lumbar pillow.  Switching tasks to something that requires you to be upright or walking.  Talking a brief walk.  Lying down to rest in a neutral-spine position. PROLONGED STANDING WHILE SLIGHTLY LEANING FORWARD  When completing a task that requires you to lean forward while standing in one place for a long time, place either foot up on a stationary 2-4 inch high object to help maintain the best posture. When both feet are on the ground, the low back tends to lose its slight inward curve. If this curve flattens (or becomes too large), then the back and your other joints will experience too much stress, fatigue more quickly and can cause pain.  CORRECT STANDING POSTURES Proper standing posture should be assumed with all daily activities, even if they only take a few moments, like when brushing your teeth. As in sitting, your ears should fall over your shoulders and your shoulders should fall over your hips. You should keep a slight tension in your abdominal  muscles to brace your spine. Your tailbone should point down to the ground, not behind your body, resulting in an over-extended swayback posture.  INCORRECT STANDING POSTURES  Common incorrect standing postures include a forward head, locked knees and/or an excessive swayback. WALKING Walk with an upright posture. Your ears, shoulders and hips should all line-up. PROLONGED ACTIVITY IN A FLEXED POSITION When completing a task that requires you to bend forward   at your waist or lean over a low surface, try to find a way to stabilize 3 of 4 of your limbs. You can place a hand or elbow on your thigh or rest a knee on the surface you are reaching across. This will provide you more stability so that your muscles do not fatigue as quickly. By keeping your knees relaxed, or slightly bent, you will also reduce stress across your low back. CORRECT LIFTING TECHNIQUES DO :   Assume a wide stance. This will provide you more stability and the opportunity to get as close as possible to the object which you are lifting.  Tense your abdominals to brace your spine; then bend at the knees and hips. Keeping your back locked in a neutral-spine position, lift using your leg muscles. Lift with your legs, keeping your back straight.  Test the weight of unknown objects before attempting to lift them.  Try to keep your elbows locked down at your sides in order get the best strength from your shoulders when carrying an object.  Always ask for help when lifting heavy or awkward objects. INCORRECT LIFTING TECHNIQUES DO NOT:   Lock your knees when lifting, even if it is a small object.  Bend and twist. Pivot at your feet or move your feet when needing to change directions.  Assume that you cannot safely pick up a paperclip without proper posture. Document Released: 03/15/2005 Document Revised: 07/30/2013 Document Reviewed: 06/27/2008 [employer] Patient Information 2015 [employer]. This information is not intended to  replace advice given to you by your health care provider. Make sure you discuss any questions you have with your health care provider.  

## 2013-11-12 NOTE — Progress Notes (Signed)
Subjective:    Patient ID: [name], male    DOB: [birth year], 80 y.o.   MRN: [id number]  HPI 80 year old male, nonsmoker is in today with complaints of left buttocks and thigh pain x2 days. Reports the pain comes and goes, described as sharp. Recent pain he had an. To the back of warm water and that helped. Symptoms appear to be better today but still has pain. Has a history of sciatica in the past. No history of any recent back injury.   Review of Systems  Constitutional: Negative.   Cardiovascular: Negative.   Gastrointestinal: Negative.   Endocrine: Negative.   Genitourinary: Negative.   Musculoskeletal: Positive for arthralgias.       Left buttock pain and upper thigh   Skin: Negative.   Allergic/Immunologic: Negative.   Neurological: Negative.   Psychiatric/Behavioral: Negative.    Past Medical History  Diagnosis Date  . IBS (irritable bowel syndrome)   . Hemorrhoids   . Diverticulosis   . GERD (gastroesophageal reflux disease)   . Tubular adenoma of colon 07/1998  . Anxiety and depression   . Elevated prostate specific antigen (PSA)   . Vitamin B12 deficiency   . UTI (lower urinary tract infection)   . Arthritis   . Sciatica   . Anemia   . TIA (transient ischemic attack)   . Peptic ulcer disease   . Hyperlipidemia   . CAD (coronary artery disease)     PCI 2006, Stress Perfusion Study 2011  . Hypertension   . Rheumatoid arthritis(714.0)   . Polymyalgia     History   Social History  . Marital Status: Married    Spouse Name: N/A    Number of Children: 1  . Years of Education: N/A   Occupational History  . Retired English as a second language teacher    Social History Main Topics  . Smoking status: Former Smoker -- 0.25 packs/day for 20 years    Types: Cigarettes    Quit date: 03/30/1971  . Smokeless tobacco: Never Used  . Alcohol Use: No     Comment: occasional  . Drug Use: No  . Sexual Activity: Not Currently   Other Topics Concern  . Not on file   Social History  Narrative  . No narrative on file    Past Surgical History  Procedure Laterality Date  . Ptca  2006    Stenting of proximal LAD w/ 75 % stenosis reduced to 0%  . Cataract extraction Bilateral   . Cholecystectomy  2001  . Hernia repair    . Hip fracture surgery  03/2007    Right  . Salivary stone removal  11/2007  . Radiology with anesthesia N/A 11/16/2012    Procedure: EMBOLIZATION  OF FISTULA;  Surgeon: Rob Hickman, MD;  Location: [facility];  Service: Radiology;  Laterality: N/A;  . 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 Colon cancer Maternal Uncle 39    ish  . Coronary artery disease Father     Allergies  Allergen Reactions  . Amoxicillin     REACTION: unspecified  . Doxycycline   . Erythromycin     REACTION: unspecified  . Penicillins     rash  . Sulfamethoxazole-Trimethoprim     unknown    Current Outpatient Prescriptions on File Prior to Visit  Medication Sig Dispense Refill  . aspirin 81 MG tablet Take 81 mg by mouth daily.      Marland Kitchen  Coenzyme Q10 (CO Q-10) 100 MG CAPS Take 1 capsule by mouth daily.      . Diphenhyd-Hydrocort-Nystatin (FIRST-DUKES MOUTHWASH) SUSP 1 teaspoon swish and swallow 4 times daily  1 Bottle  1  . GuaiFENesin (MUCINEX PO) Take 600 mg by mouth as needed.      . metoprolol succinate (TOPROL-XL) 25 MG 24 hr tablet Take 1 tablet (25 mg total) by mouth daily.  90 tablet  1  . Multiple Vitamins-Minerals (VISION-VITE PRESERVE PO) Take 2 tablets by mouth daily.      . nitroGLYCERIN (NITROSTAT) 0.4 MG SL tablet Place 1 tablet (0.4 mg total) under the tongue every 5 (five) minutes as needed.  25 tablet  6  . omeprazole (PRILOSEC) 40 MG capsule Take 40 mg by mouth 2 (two) times daily.       . predniSONE (DELTASONE) 1 MG tablet Take 3 mg by mouth daily with breakfast.      . Pseudoephedrine HCl (WAL-PHED 12 HOUR PO) Take 1 tablet by mouth daily.      . vitamin B-12 (CYANOCOBALAMIN) 1000 MCG  tablet Take 1,000 mcg by mouth daily.       No current facility-administered medications on file prior to visit.    BP 150/70  Pulse 86  Temp(Src) 97.7 F (36.5 C) (Oral)  Ht 5\' 11"  (1.803 m)  Wt 178 lb (80.74 kg)  BMI 24.84 kg/m2chart    Objective:   Physical Exam  Constitutional: He is oriented to person, place, and time. He appears well-developed and well-nourished.  Neck: Normal range of motion. Neck supple.  Cardiovascular: Normal rate, regular rhythm and normal heart sounds.   Pulmonary/Chest: Effort normal and breath sounds normal.  Musculoskeletal: He exhibits tenderness. He exhibits no edema.       Legs: Neurological: He is alert and oriented to person, place, and time.  Skin: Skin is warm and dry.  Psychiatric: He has a normal mood and affect.          Assessment & Plan:  [name] was seen today for left buttock pain.  Diagnoses and associated orders for this visit:  Sciatic leg pain  Muscle strain  Other Orders - cyclobenzaprine (FLEXERIL) 5 MG tablet; Take 1 tablet (5 mg total) by mouth 3 (three) times daily as needed for muscle spasms.   Call the office with any questions or concerns. Recheck as scheduled and as needed.

## 2013-11-12 NOTE — Progress Notes (Signed)
Pre visit review using our clinic review tool, if applicable. No additional management support is needed unless otherwise documented below in the visit note. 

## 2013-11-15 ENCOUNTER — Ambulatory Visit (INDEPENDENT_AMBULATORY_CARE_PROVIDER_SITE_OTHER): Payer: Medicare Other | Admitting: Cardiology

## 2013-11-15 ENCOUNTER — Encounter: Payer: Self-pay | Admitting: Cardiology

## 2013-11-15 VITALS — BP 180/90 | HR 65 | Ht 68.0 in | Wt 177.0 lb

## 2013-11-15 DIAGNOSIS — I2510 Atherosclerotic heart disease of native coronary artery without angina pectoris: Secondary | ICD-10-CM

## 2013-11-15 NOTE — Progress Notes (Signed)
HPI The patient presents for followup.  Earlier this year he had a stress test to evaluate chest discomfort.  This was negative for any evidence of ischemia. He also had embolization of a dural fistula.   He says he hasn't felt well since that time. In particular he just has generalized complaints and is very agitated. He has had chest discomfort as described but this doesn't seem to be different than the symptoms he had prior to his stress test. He can't distinguish it from his reflux but he does then gets worse since he reduced  His dose of omeprazole. He's not had any new shortness of breath, PND or orthopnea. He is able to do some activities and doesn't bring on the symptoms with this.  Allergies  Allergen Reactions  . Amoxicillin     REACTION: unspecified  . Doxycycline   . Erythromycin     REACTION: unspecified  . Penicillins     rash  . Sulfamethoxazole-Trimethoprim     unknown    Current Outpatient Prescriptions  Medication Sig Dispense Refill  . aspirin 81 MG tablet Take 81 mg by mouth daily.      . Cholecalciferol (VITAMIN D PO) Take 2,000 mg by mouth daily.      . Coenzyme Q10 (CO Q-10) 100 MG CAPS Take 1 capsule by mouth daily.      . cyclobenzaprine (FLEXERIL) 5 MG tablet Take 1 tablet (5 mg total) by mouth 3 (three) times daily as needed for muscle spasms.  30 tablet  0  . Diphenhyd-Hydrocort-Nystatin (FIRST-DUKES MOUTHWASH) SUSP 1 teaspoon swish and swallow 4 times daily  1 Bottle  1  . GuaiFENesin (MUCINEX PO) Take 600 mg by mouth as needed.      . metoprolol succinate (TOPROL-XL) 25 MG 24 hr tablet Take 1 tablet (25 mg total) by mouth daily.  90 tablet  1  . Multiple Vitamins-Minerals (VISION-VITE PRESERVE PO) Take 2 tablets by mouth daily.      . nitroGLYCERIN (NITROSTAT) 0.4 MG SL tablet Place 1 tablet (0.4 mg total) under the tongue every 5 (five) minutes as needed.  25 tablet  6  . omeprazole (PRILOSEC) 40 MG capsule Take 40 mg by mouth daily.       . predniSONE  (DELTASONE) 1 MG tablet Take 3 mg by mouth daily with breakfast.      . Pseudoephedrine HCl (WAL-PHED 12 HOUR PO) Take 1 tablet by mouth daily.      . vitamin B-12 (CYANOCOBALAMIN) 1000 MCG tablet Take 1,000 mcg by mouth daily.       No current facility-administered medications for this visit.    Past Medical History  Diagnosis Date  . IBS (irritable bowel syndrome)   . Hemorrhoids   . Diverticulosis   . GERD (gastroesophageal reflux disease)   . Tubular adenoma of colon 07/1998  . Anxiety and depression   . Elevated prostate specific antigen (PSA)   . Vitamin B12 deficiency   . UTI (lower urinary tract infection)   . Arthritis   . Sciatica   . Anemia   . TIA (transient ischemic attack)   . Peptic ulcer disease   . Hyperlipidemia   . CAD (coronary artery disease)     PCI 2006, Stress Perfusion Study 2011  . Hypertension   . Rheumatoid arthritis(714.0)   . Polymyalgia     Past Surgical History  Procedure Laterality Date  . Ptca  2006    Stenting of proximal LAD w/ 75 %  stenosis reduced to 0%  . Cataract extraction Bilateral   . Cholecystectomy  2001  . Hernia repair    . Hip fracture surgery  03/2007    Right  . Salivary stone removal  11/2007  . Radiology with anesthesia N/A 11/16/2012    Procedure: EMBOLIZATION  OF FISTULA;  Surgeon: Rob Hickman, MD;  Location: [facility];  Service: Radiology;  Laterality: N/A;  . Dural fistula embolition with onyx  12/07/12    ROS:  As stated in the HPI and negative for all other systems.  PHYSICAL EXAM BP 180/90  Pulse 65  Ht 5\' 8"  (1.727 m)  Wt 177 lb (80.287 kg)  BMI 26.92 kg/m2 GENERAL:  Well appearing NECK:  No jugular venous distention, waveform within normal limits, carotid upstroke brisk and symmetric, right bruits, no thyromegaly LUNGS:  Clear to auscultation bilaterally BACK:  No CVA tenderness CHEST:  Unremarkable HEART:  PMI not displaced or sustained,S1 and S2 within normal limits, no S3, no S4, no clicks, no  rubs, no murmurs ABD:  Flat, positive bowel sounds normal in frequency in pitch, no bruits, no rebound, no guarding, no midline pulsatile mass, no hepatomegaly, no splenomegaly EXT:  2 plus pulses throughout, no edema, no cyanosis no clubbing  EKG:  Sinus rhythm, rate 65, LVH by voltage criteria, no acute ST T wave changes.  11/15/2013  ASSESSMENT AND PLAN  CHEST PAIN:   This is not different compared with his symptoms he had at the time of his stress test. I have suggested he followup with Dr. Fuller Plan  For evaluation of nonanginal chest pain.  HYPERLIPIDEMIA:  I am going to check a lipid profile.  He has not been taking a statin because of joint pains.    ANXIETY:   This seems to be a significant complaint and probably contributes to his chest discomfort. I suggested that he should specifically make an appointment with Dr. Nyoka Cowden, MD  to see if there is any furher management of this.  CAROTID BRUIT:  He had less than 50% plaque last year and I will follow up on this with repeat Dopplers next year.

## 2013-11-15 NOTE — Patient Instructions (Signed)
Your physician recommends that you schedule a follow-up appointment in: one year with Dr. Hochrein  

## 2013-12-05 ENCOUNTER — Ambulatory Visit (INDEPENDENT_AMBULATORY_CARE_PROVIDER_SITE_OTHER): Payer: Medicare Other | Admitting: Internal Medicine

## 2013-12-05 ENCOUNTER — Encounter: Payer: Self-pay | Admitting: Internal Medicine

## 2013-12-05 VITALS — BP 118/60 | HR 76 | Temp 98.0°F | Resp 18 | Ht 68.0 in | Wt 171.0 lb

## 2013-12-05 DIAGNOSIS — L988 Other specified disorders of the skin and subcutaneous tissue: Secondary | ICD-10-CM

## 2013-12-05 DIAGNOSIS — M353 Polymyalgia rheumatica: Secondary | ICD-10-CM | POA: Diagnosis not present

## 2013-12-05 DIAGNOSIS — E039 Hypothyroidism, unspecified: Secondary | ICD-10-CM

## 2013-12-05 DIAGNOSIS — L089 Local infection of the skin and subcutaneous tissue, unspecified: Secondary | ICD-10-CM | POA: Diagnosis not present

## 2013-12-05 DIAGNOSIS — I2510 Atherosclerotic heart disease of native coronary artery without angina pectoris: Secondary | ICD-10-CM | POA: Diagnosis not present

## 2013-12-05 NOTE — Patient Instructions (Signed)
Limit your sodium (Salt) intake    It is important that you exercise regularly, at least 20 minutes 3 to 4 times per week.  If you develop chest pain or shortness of breath seek  medical attention.  Return in 3 months for follow-up  

## 2013-12-05 NOTE — Progress Notes (Signed)
   Subjective:    Patient ID: [name], male    DOB: [birth year], 80 y.o.   MRN: [id number]  HPI 80 year old patient who is seen today in followup.  He has multiple somatic complaints including general sense of unwellness, memory loss, dizziness, weakness, and unsteady gait. He has self discontinued PPI therapy and has done quite well.  He has a history of B12 deficiency.  Remains on B12 supplements Recent lab including hemoglobin and sedimentation rate were reviewed In general, he looks quite well  Wt Readings from Last 3 Encounters:  12/05/13 171 lb (77.565 kg)  11/15/13 177 lb (80.287 kg)  11/12/13 178 lb (80.74 kg)     Review of Systems  Constitutional: Positive for activity change and fatigue. Negative for fever, chills and appetite change.  HENT: Negative for congestion, dental problem, ear pain, hearing loss, sore throat, tinnitus, trouble swallowing and voice change.   Eyes: Negative for pain, discharge and visual disturbance.  Respiratory: Negative for cough, chest tightness, wheezing and stridor.   Cardiovascular: Negative for chest pain, palpitations and leg swelling.  Gastrointestinal: Negative for nausea, vomiting, abdominal pain, diarrhea, constipation, blood in stool and abdominal distention.  Genitourinary: Negative for urgency, hematuria, flank pain, discharge, difficulty urinating and genital sores.  Musculoskeletal: Positive for back pain. Negative for arthralgias, gait problem, joint swelling, myalgias and neck stiffness.  Skin: Negative for rash.  Neurological: Positive for dizziness. Negative for syncope, speech difficulty, weakness, numbness and headaches.  Hematological: Negative for adenopathy. Does not bruise/bleed easily.  Psychiatric/Behavioral: Negative for behavioral problems and dysphoric mood. The patient is not nervous/anxious.        Objective:   Physical Exam  Constitutional: He is oriented to person, place, and time. He appears well-developed.   HENT:  Head: Normocephalic.  Right Ear: External ear normal.  Left Ear: External ear normal.  Eyes: Conjunctivae and EOM are normal.  Neck: Normal range of motion.  Cardiovascular: Normal rate, normal heart sounds and intact distal pulses.   Pulmonary/Chest: Breath sounds normal.  Abdominal: Bowel sounds are normal.  Musculoskeletal: Normal range of motion. He exhibits no edema and no tenderness.  Neurological: He is alert and oriented to person, place, and time.  Psychiatric: He has a normal mood and affect. His behavior is normal.          Assessment & Plan:   Coronary artery disease.  Recent cardiology followup Anxiety depression PMR.  We'll continue low-dose prednisone.  Recheck sedimentation rate.  Next office visit B12 deficiency.  Continue B12 supplements

## 2013-12-05 NOTE — Progress Notes (Signed)
Pre visit review using our clinic review tool, if applicable. No additional management support is needed unless otherwise documented below in the visit note. 

## 2013-12-20 DIAGNOSIS — Z961 Presence of intraocular lens: Secondary | ICD-10-CM | POA: Diagnosis not present

## 2013-12-20 DIAGNOSIS — H353290 Exudative age-related macular degeneration, unspecified eye, stage unspecified: Secondary | ICD-10-CM | POA: Diagnosis not present

## 2013-12-20 DIAGNOSIS — H353190 Nonexudative age-related macular degeneration, unspecified eye, stage unspecified: Secondary | ICD-10-CM | POA: Diagnosis not present

## 2013-12-20 DIAGNOSIS — H43819 Vitreous degeneration, unspecified eye: Secondary | ICD-10-CM | POA: Diagnosis not present

## 2013-12-24 ENCOUNTER — Other Ambulatory Visit: Payer: Self-pay

## 2013-12-24 ENCOUNTER — Telehealth: Payer: Self-pay | Admitting: Gastroenterology

## 2013-12-24 MED ORDER — ESOMEPRAZOLE MAGNESIUM 40 MG PO CPDR: 40.0000 mg | DELAYED_RELEASE_CAPSULE | Freq: Every day | ORAL | Status: DC

## 2013-12-24 NOTE — Telephone Encounter (Signed)
Already refilled prescription at pharmacy this morning. Informed patient that I filled the prescription for Nexium already.

## 2013-12-25 DIAGNOSIS — H353290 Exudative age-related macular degeneration, unspecified eye, stage unspecified: Secondary | ICD-10-CM | POA: Diagnosis not present

## 2013-12-25 DIAGNOSIS — H532 Diplopia: Secondary | ICD-10-CM | POA: Diagnosis not present

## 2014-01-03 ENCOUNTER — Other Ambulatory Visit (HOSPITAL_COMMUNITY): Payer: Self-pay | Admitting: Interventional Radiology

## 2014-01-03 DIAGNOSIS — I671 Cerebral aneurysm, nonruptured: Secondary | ICD-10-CM

## 2014-01-06 ENCOUNTER — Other Ambulatory Visit: Payer: Self-pay | Admitting: Internal Medicine

## 2014-01-08 ENCOUNTER — Ambulatory Visit (INDEPENDENT_AMBULATORY_CARE_PROVIDER_SITE_OTHER): Payer: Medicare Other | Admitting: Internal Medicine

## 2014-01-08 ENCOUNTER — Encounter: Payer: Self-pay | Admitting: Internal Medicine

## 2014-01-08 VITALS — BP 130/60 | HR 75 | Temp 97.9°F | Resp 20 | Ht 69.5 in | Wt 174.0 lb

## 2014-01-08 DIAGNOSIS — M353 Polymyalgia rheumatica: Secondary | ICD-10-CM | POA: Diagnosis not present

## 2014-01-08 DIAGNOSIS — R413 Other amnesia: Secondary | ICD-10-CM | POA: Diagnosis not present

## 2014-01-08 DIAGNOSIS — E038 Other specified hypothyroidism: Secondary | ICD-10-CM | POA: Diagnosis not present

## 2014-01-08 DIAGNOSIS — I2510 Atherosclerotic heart disease of native coronary artery without angina pectoris: Secondary | ICD-10-CM

## 2014-01-08 DIAGNOSIS — E538 Deficiency of other specified B group vitamins: Secondary | ICD-10-CM

## 2014-01-08 DIAGNOSIS — F341 Dysthymic disorder: Secondary | ICD-10-CM

## 2014-01-08 DIAGNOSIS — E785 Hyperlipidemia, unspecified: Secondary | ICD-10-CM

## 2014-01-08 DIAGNOSIS — Z0000 Encounter for general adult medical examination without abnormal findings: Secondary | ICD-10-CM

## 2014-01-08 DIAGNOSIS — D509 Iron deficiency anemia, unspecified: Secondary | ICD-10-CM

## 2014-01-08 DIAGNOSIS — E034 Atrophy of thyroid (acquired): Secondary | ICD-10-CM

## 2014-01-08 MED ORDER — FLUTICASONE PROPIONATE 50 MCG/ACT NA SUSP: 2.0000 | Freq: Every day | NASAL | Status: AC

## 2014-01-08 NOTE — Progress Notes (Signed)
Patient ID: [name], male   DOB: [birth year], 80 y.o.   MRN: [id number]  Subjective:    Patient ID: [name], male    DOB: [birth year], 80 y.o.   MRN: [id number]  HPI 80  year-old patient who is in today for a preventive health examination.  He is followed by cardiology for  coronary artery disease and also by rheumatology in the past due to the polymyalgia rheumatica. He is followed by multiple consultants He had a colonoscopy in January of 2014. A biopsy for temporal arteritis was neg 2 years ago.  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 IBS (irritable bowel syndrome)   . Hemorrhoids   . Diverticulosis   . GERD (gastroesophageal reflux disease)   . Tubular adenoma of colon 07/1998  . Anxiety and depression   . Elevated prostate specific antigen (PSA)   . Vitamin B12 deficiency   . UTI (lower urinary tract infection)   . Arthritis   . Sciatica   . Anemia   . TIA (transient ischemic attack)   . Peptic ulcer disease   . Hyperlipidemia   . CAD (coronary artery disease)     PCI 2006, Stress Perfusion Study 2011  . Hypertension   . Rheumatoid arthritis(714.0)   . Polymyalgia     History   Social History  . Marital Status: Married    Spouse Name: N/A    Number of Children: 1  . Years of Education: N/A   Occupational History  . Retired English as a second language teacher    Social History Main Topics  . Smoking status: Former Smoker -- 0.25 packs/day for 20 years    Types: Cigarettes    Quit date: 03/30/1971  . Smokeless tobacco: Never Used  . Alcohol Use: No     Comment: occasional  . Drug Use: No  . Sexual Activity: Not Currently   Other Topics Concern  . Not on file   Social History Narrative  . No narrative on file    Past Surgical History  Procedure Laterality Date  . Ptca  2006    Stenting of proximal LAD w/ 75 % stenosis reduced to 0%  . Cataract extraction Bilateral   . Cholecystectomy  2001  . Hernia repair     . Hip fracture surgery  03/2007    Right  . Salivary stone removal  11/2007  . Radiology with anesthesia N/A 11/16/2012    Procedure: EMBOLIZATION  OF FISTULA;  Surgeon: Rob Hickman, MD;  Location: [facility];  Service: Radiology;  Laterality: N/A;  . 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 Colon cancer Maternal Uncle 47    ish  . Coronary artery disease Father     Allergies  Allergen Reactions  . Amoxicillin     REACTION: unspecified  . Doxycycline   . Erythromycin     REACTION: unspecified  . Penicillins     rash  . Sulfamethoxazole-Trimethoprim     unknown    Current Outpatient Prescriptions on File Prior to Visit  Medication Sig Dispense Refill  . aspirin 81 MG tablet Take 81 mg by mouth daily.      . Cholecalciferol (VITAMIN D PO) Take 2,000 mg by mouth daily.      . Coenzyme Q10 (CO Q-10) 100 MG CAPS Take 1 capsule by mouth daily.      Marland Kitchen  esomeprazole (NEXIUM) 40 MG capsule Take 1 capsule (40 mg total) by mouth daily at 12 noon.  30 capsule  0  . GuaiFENesin (MUCINEX PO) Take 600 mg by mouth as needed.      . metoprolol succinate (TOPROL-XL) 25 MG 24 hr tablet Take 1 tablet (25 mg total) by mouth daily.  90 tablet  1  . nitroGLYCERIN (NITROSTAT) 0.4 MG SL tablet Place 1 tablet (0.4 mg total) under the tongue every 5 (five) minutes as needed.  25 tablet  6  . predniSONE (DELTASONE) 1 MG tablet Take 2 mg by mouth daily with breakfast.       . vitamin B-12 (CYANOCOBALAMIN) 1000 MCG tablet Take 1,000 mcg by mouth daily.       No current facility-administered medications on file prior to visit.    BP 130/60  Pulse 75  Temp(Src) 97.9 F (36.6 C) (Oral)  Resp 20  Ht 5' 9.5" (1.765 m)  Wt 174 lb (78.926 kg)  BMI 25.34 kg/m2  SpO2 98%      1. Risk factors, based on past  M,S,F history-  patient has known coronary artery disease. Cardiac risk factors include dyslipidemia and hypertension  2.  Physical  activities: Remains fairly active considering his age  26.  Depression/mood: No history of depression or mood disorder  4.  Hearing: Minor deficits only  5.  ADL's: Remains independent in all aspects of daily living  6.  Fall risk: Moderate due to age and visual disturbance  7.  Home safety: No problems identified 8.  Height weight, and visual acuity; height and weight stable. Followed closely by ophthalmology due to macular degeneration 9.  Counseling: Low-salt heart healthy diet and regular exercise encouraged  10. Lab orders based on risk factors: Temperature profile will be reviewed 11. Referral : Followup ophthalmology and cardiology as well as rheumatology  12. Care plan: Heart healthy diet regular exercise  13. Cognitive assessment: Alert and appropriate does have some mild age-related very mild cognitive impairment and 14.  Preventive services include annual health assessment with screening lab. Patient was provided with a written and personalized care plan  15.  Provider list, updates include ENT, GI, cardiology, and primary care     BP 130/60  Pulse 75  Temp(Src) 97.9 F (36.6 C) (Oral)  Resp 20  Ht 5' 9.5" (1.765 m)  Wt 174 lb (78.926 kg)  BMI 25.34 kg/m2  SpO2 98%     Review of Systems  Constitutional: Positive for fatigue. Negative for fever, chills, activity change and appetite change.  HENT: Positive for ear pain (right-sided tinnitus). Negative for congestion, dental problem, hearing loss, mouth sores, rhinorrhea, sinus pressure, sneezing, tinnitus, trouble swallowing and voice change.   Eyes: Positive for visual disturbance. Negative for photophobia, pain and redness.  Respiratory: Positive for cough. Negative for apnea, choking, chest tightness, shortness of breath and wheezing.   Cardiovascular: Negative for chest pain, palpitations and leg swelling.  Gastrointestinal: Negative for nausea, vomiting, abdominal pain, diarrhea, constipation, blood in  stool, abdominal distention, anal bleeding and rectal pain.  Genitourinary: Negative for dysuria, urgency, frequency, hematuria, flank pain, decreased urine volume, discharge, penile swelling, scrotal swelling, difficulty urinating, genital sores and testicular pain.  Musculoskeletal: Positive for myalgias. Negative for arthralgias, back pain, gait problem, joint swelling, neck pain and neck stiffness.  Skin: Negative for color change, rash and wound.  Neurological: Positive for weakness. Negative for dizziness, tremors, seizures, syncope, facial asymmetry, speech difficulty, light-headedness, numbness and headaches.  Hematological: Negative for adenopathy. Does not bruise/bleed easily.  Psychiatric/Behavioral: Positive for decreased concentration. Negative for suicidal ideas, hallucinations, behavioral problems, confusion, sleep disturbance, self-injury, dysphoric mood and agitation. The patient is nervous/anxious.        Objective:   Physical Exam  Constitutional: He appears well-developed and well-nourished.  Blood pressure 130/82 bilaterally  HENT:  Head: Normocephalic and atraumatic.  Right Ear: External ear normal.  Left Ear: External ear normal.  Nose: Nose normal.  Mouth/Throat: Oropharynx is clear and moist.  Edentulous  Eyes: Conjunctivae and EOM are normal. Pupils are equal, round, and reactive to light. No scleral icterus.  Neck: Normal range of motion. Neck supple. No JVD present. No thyromegaly present.  Bilateral bruits, right greater than left  Cardiovascular: Regular rhythm, normal heart sounds and intact distal pulses.  Exam reveals no gallop and no friction rub.   No murmur heard. Pedal pulses full  Pulmonary/Chest: Effort normal and breath sounds normal. He exhibits no tenderness.  Abdominal: Soft. Bowel sounds are normal. He exhibits no distension and no mass. There is no tenderness.  Genitourinary: Rectum normal, prostate normal and penis normal. No penile  tenderness.  Musculoskeletal: Normal range of motion. He exhibits no edema and no tenderness.  Lymphadenopathy:    He has no cervical adenopathy.  Neurological: He is alert. He has normal reflexes. No cranial nerve deficit. Coordination normal.  Mild decreased vibratory sensation distally Reflexes brisk  Skin: Skin is warm and dry. No rash noted.  Psychiatric: He has a normal mood and affect. His behavior is normal.          Assessment & Plan:   Preventive health examination  Polymyalgia rheumatica. Continue slow prednisone taper per rheumatology Coronary artery disease stable Dyslipidemia.  atorvastatin has been discontinued Nasal congestion.  Patient requests nasal spray; will give a trial of fluticasone Right-sided tinnitus.  Possibly secondary to right carotid bruit Hypothyroidism  Recheck 6 months Laboratory update will be reviewed

## 2014-01-08 NOTE — Progress Notes (Signed)
Pre visit review using our clinic review tool, if applicable. No additional management support is needed unless otherwise documented below in the visit note. 

## 2014-01-08 NOTE — Patient Instructions (Addendum)
It is important that you exercise regularly, at least 20 minutes 3 to 4 times per week.  If you develop chest pain or shortness of breath seek  medical attention.  Return in 6 months for follow-up  Health Maintenance A healthy lifestyle and preventative care can promote health and wellness.  Maintain regular health, dental, and eye exams.  Eat a healthy diet. Foods like vegetables, fruits, whole grains, low-fat dairy products, and lean protein foods contain the nutrients you need and are low in calories. Decrease your intake of foods high in solid fats, added sugars, and salt. Get information about a proper diet from your health care provider, if necessary.  Regular physical exercise is one of the most important things you can do for your health. Most adults should get at least 150 minutes of moderate-intensity exercise (any activity that increases your heart rate and causes you to sweat) each week. In addition, most adults need muscle-strengthening exercises on 2 or more days a week.   Maintain a healthy weight. The body mass index (BMI) is a screening tool to identify possible weight problems. It provides an estimate of body fat based on height and weight. Your health care provider can find your BMI and can help you achieve or maintain a healthy weight. For males 20 years and older:  A BMI below 18.5 is considered underweight.  A BMI of 18.5 to 24.9 is normal.  A BMI of 25 to 29.9 is considered overweight.  A BMI of 30 and above is considered obese.  Maintain normal blood lipids and cholesterol by exercising and minimizing your intake of saturated fat. Eat a balanced diet with plenty of fruits and vegetables. Blood tests for lipids and cholesterol should begin at age 59 and be repeated every 5 years. If your lipid or cholesterol levels are high, you are over age 72, or you are at high risk for heart disease, you may need your cholesterol levels checked more frequently.Ongoing high lipid  and cholesterol levels should be treated with medicines if diet and exercise are not working.  If you smoke, find out from your health care provider how to quit. If you do not use tobacco, do not start.  Lung cancer screening is recommended for adults aged 61-80 years who are at high risk for developing lung cancer because of a history of smoking. A yearly low-dose CT scan of the lungs is recommended for people who have at least a 30-pack-year history of smoking and are current smokers or have quit within the past 15 years. A pack year of smoking is smoking an average of 1 pack of cigarettes a day for 1 year (for example, a 30-pack-year history of smoking could mean smoking 1 pack a day for 30 years or 2 packs a day for 15 years). Yearly screening should continue until the smoker has stopped smoking for at least 15 years. Yearly screening should be stopped for people who develop a health problem that would prevent them from having lung cancer treatment.  If you choose to drink alcohol, do not have more than 2 drinks per day. One drink is considered to be 12 oz (360 mL) of beer, 5 oz (150 mL) of wine, or 1.5 oz (45 mL) of liquor.  Avoid the use of street drugs. Do not share needles with anyone. Ask for help if you need support or instructions about stopping the use of drugs.  High blood pressure causes heart disease and increases the risk of stroke. Blood  pressure should be checked at least every 1-2 years. Ongoing high blood pressure should be treated with medicines if weight loss and exercise are not effective.  If you are 45-79 years old, ask your health care provider if you should take aspirin to prevent heart disease.  Diabetes screening involves taking a blood sample to check your fasting blood sugar level. This should be done once every 3 years after age 45 if you are at a normal weight and without risk factors for diabetes. Testing should be considered at a younger age or be carried out more  frequently if you are overweight and have at least 1 risk factor for diabetes.  Colorectal cancer can be detected and often prevented. Most routine colorectal cancer screening begins at the age of 50 and continues through age 75. However, your health care provider may recommend screening at an earlier age if you have risk factors for colon cancer. On a yearly basis, your health care provider may provide home test kits to check for hidden blood in the stool. A small camera at the end of a tube may be used to directly examine the colon (sigmoidoscopy or colonoscopy) to detect the earliest forms of colorectal cancer. Talk to your health care provider about this at age 50 when routine screening begins. A direct exam of the colon should be repeated every 5-10 years through age 75, unless early forms of precancerous polyps or small growths are found.  People who are at an increased risk for hepatitis B should be screened for this virus. You are considered at high risk for hepatitis B if:  You were born in a country where hepatitis B occurs often. Talk with your health care provider about which countries are considered high risk.  Your parents were born in a high-risk country and you have not received a shot to protect against hepatitis B (hepatitis B vaccine).  You have HIV or AIDS.  You use needles to inject street drugs.  You live with, or have sex with, someone who has hepatitis B.  You are a man who has sex with other men (MSM).  You get hemodialysis treatment.  You take certain medicines for conditions like cancer, organ transplantation, and autoimmune conditions.  Hepatitis C blood testing is recommended for all people born from 1945 through 1965 and any individual with known risk factors for hepatitis C.  Healthy men should no longer receive prostate-specific antigen (PSA) blood tests as part of routine cancer screening. Talk to your health care provider about prostate cancer  screening.  Testicular cancer screening is not recommended for adolescents or adult males who have no symptoms. Screening includes self-exam, a health care provider exam, and other screening tests. Consult with your health care provider about any symptoms you have or any concerns you have about testicular cancer.  Practice safe sex. Use condoms and avoid high-risk sexual practices to reduce the spread of sexually transmitted infections (STIs).  You should be screened for STIs, including gonorrhea and chlamydia if:  You are sexually active and are younger than 24 years.  You are older than 24 years, and your health care provider tells you that you are at risk for this type of infection.  Your sexual activity has changed since you were last screened, and you are at an increased risk for chlamydia or gonorrhea. Ask your health care provider if you are at risk.  If you are at risk of being infected with HIV, it is recommended that you take   a prescription medicine daily to prevent HIV infection. This is called pre-exposure prophylaxis (PrEP). You are considered at risk if:  You are a man who has sex with other men (MSM).  You are a heterosexual man who is sexually active with multiple partners.  You take drugs by injection.  You are sexually active with a partner who has HIV.  Talk with your health care provider about whether you are at high risk of being infected with HIV. If you choose to begin PrEP, you should first be tested for HIV. You should then be tested every 3 months for as long as you are taking PrEP.  Use sunscreen. Apply sunscreen liberally and repeatedly throughout the day. You should seek shade when your shadow is shorter than you. Protect yourself by wearing long sleeves, pants, a wide-brimmed hat, and sunglasses year round whenever you are outdoors.  Tell your health care provider of new moles or changes in moles, especially if there is a change in shape or color. Also, tell  your health care provider if a mole is larger than the size of a pencil eraser.  A one-time screening for abdominal aortic aneurysm (AAA) and surgical repair of large AAAs by ultrasound is recommended for men aged 54-75 years who are current or former smokers.  Stay current with your vaccines (immunizations). Document Released: 09/11/2007 Document Revised: 03/20/2013 Document Reviewed: 08/10/2010 [facility] Patient Information 2015 [place], [place]. This information is not intended to replace advice given to you by your health care provider. Make sure you discuss any questions you have with your health care provider.

## 2014-01-15 ENCOUNTER — Ambulatory Visit (HOSPITAL_COMMUNITY)
Admission: RE | Admit: 2014-01-15 | Discharge: 2014-01-15 | Disposition: A | Discharge status: Home / Self Care | Payer: Medicare Other | Source: Ambulatory Visit | Attending: Interventional Radiology | Admitting: Interventional Radiology

## 2014-01-15 DIAGNOSIS — G319 Degenerative disease of nervous system, unspecified: Secondary | ICD-10-CM | POA: Insufficient documentation

## 2014-01-15 DIAGNOSIS — R42 Dizziness and giddiness: Secondary | ICD-10-CM | POA: Diagnosis not present

## 2014-01-15 DIAGNOSIS — I6782 Cerebral ischemia: Secondary | ICD-10-CM | POA: Diagnosis not present

## 2014-01-15 DIAGNOSIS — R51 Headache: Secondary | ICD-10-CM | POA: Diagnosis not present

## 2014-01-15 DIAGNOSIS — I671 Cerebral aneurysm, nonruptured: Secondary | ICD-10-CM | POA: Insufficient documentation

## 2014-01-15 LAB — POCT I-STAT CREATININE: CREATININE: 1.3 mg/dL (ref 0.50–1.35)

## 2014-01-15 MED ORDER — GADOBENATE DIMEGLUMINE 529 MG/ML IV SOLN
17.0000 mL | Freq: Once | INTRAVENOUS | Status: AC | PRN
  Administered 2014-01-15: 17 mL via INTRAVENOUS

## 2014-01-17 ENCOUNTER — Ambulatory Visit (HOSPITAL_COMMUNITY): Admission: RE | Admit: 2014-01-17 | Payer: Medicare Other | Source: Ambulatory Visit

## 2014-01-17 ENCOUNTER — Ambulatory Visit (HOSPITAL_COMMUNITY): Payer: Medicare Other

## 2014-01-21 ENCOUNTER — Ambulatory Visit (HOSPITAL_COMMUNITY): Admission: RE | Admit: 2014-01-21 | Payer: Medicare Other | Source: Ambulatory Visit

## 2014-01-22 ENCOUNTER — Ambulatory Visit (INDEPENDENT_AMBULATORY_CARE_PROVIDER_SITE_OTHER): Payer: Medicare Other | Admitting: Internal Medicine

## 2014-01-22 ENCOUNTER — Encounter: Payer: Self-pay | Admitting: Internal Medicine

## 2014-01-22 VITALS — BP 120/70 | HR 72 | Temp 98.0°F | Resp 20 | Ht 69.5 in | Wt 175.0 lb

## 2014-01-22 DIAGNOSIS — L988 Other specified disorders of the skin and subcutaneous tissue: Secondary | ICD-10-CM

## 2014-01-22 DIAGNOSIS — I6523 Occlusion and stenosis of bilateral carotid arteries: Secondary | ICD-10-CM

## 2014-01-22 DIAGNOSIS — I2510 Atherosclerotic heart disease of native coronary artery without angina pectoris: Secondary | ICD-10-CM

## 2014-01-22 DIAGNOSIS — I6529 Occlusion and stenosis of unspecified carotid artery: Secondary | ICD-10-CM | POA: Insufficient documentation

## 2014-01-22 NOTE — Patient Instructions (Signed)
Aspirin 81 mg daily  Recheck 6 months

## 2014-01-22 NOTE — Progress Notes (Signed)
Pre visit review using our clinic review tool, if applicable. No additional management support is needed unless otherwise documented below in the visit note. 

## 2014-01-22 NOTE — Progress Notes (Signed)
Subjective:    Patient ID: [name], male    DOB: [birth year], 80 y.o.   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  He presents today with a complaint of an intermittent rowing sound from his right ear.  It is usually worse at night in the supine position.  He has had a recent follow-up brain MRI.  Approximate 1 year ago.  He did have carotid artery Doppler studies that revealed less than 50% stenosis bilaterally  Past Medical History  Diagnosis Date  . IBS (irritable bowel syndrome)   . Hemorrhoids   . Diverticulosis   . GERD (gastroesophageal reflux disease)   . Tubular adenoma of colon 07/1998  . Anxiety and depression   . Elevated prostate specific antigen (PSA)   . Vitamin B12 deficiency   . UTI (lower urinary tract infection)   . Arthritis   . Sciatica   . Anemia   . TIA (transient ischemic attack)   . Peptic ulcer disease   . Hyperlipidemia   . CAD (coronary artery disease)     PCI 2006, Stress Perfusion Study 2011  . Hypertension   . Rheumatoid arthritis(714.0)   . Polymyalgia     History   Social History  . Marital Status: Married    Spouse Name: N/A    Number of Children: 1  . Years of Education: N/A   Occupational History  . Retired English as a second language teacher    Social History Main Topics  . Smoking status: Former Smoker -- 0.25 packs/day for 20 years    Types: Cigarettes    Quit date: 03/30/1971  . Smokeless tobacco: Never Used  . Alcohol Use: No     Comment: occasional  . Drug Use: No  . Sexual Activity: Not Currently   Other Topics Concern  . Not on file   Social History Narrative  . No narrative on file    Past Surgical History  Procedure Laterality Date  . Ptca  2006    Stenting of proximal LAD w/ 75 % stenosis reduced to 0%  . Cataract extraction Bilateral   . Cholecystectomy  2001  . Hernia repair    . Hip fracture surgery  03/2007    Right  . Salivary stone removal   11/2007  . Radiology with anesthesia N/A 11/16/2012    Procedure: EMBOLIZATION  OF FISTULA;  Surgeon: Rob Hickman, MD;  Location: [place];  Service: Radiology;  Laterality: N/A;  . 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 Colon cancer Maternal Uncle 79    ish  . Coronary artery disease Father     Allergies  Allergen Reactions  . Amoxicillin     REACTION: unspecified  . Doxycycline   . Erythromycin     REACTION: unspecified  . Penicillins     rash  . Sulfamethoxazole-Trimethoprim     unknown    Current Outpatient Prescriptions on File Prior to Visit  Medication Sig Dispense Refill  . aspirin 81 MG tablet Take 81 mg by mouth daily.      . Cholecalciferol (VITAMIN D PO) Take 2,000 mg by mouth daily.      . Coenzyme Q10 (CO Q-10) 100 MG CAPS Take 1 capsule by mouth daily.      Marland Kitchen esomeprazole (NEXIUM) 40 MG capsule Take 1 capsule (40 mg total) by mouth daily at  12 noon.  30 capsule  0  . fluticasone (FLONASE) 50 MCG/ACT nasal spray PLACE 2 SPRAYS INTO THE NOSE DAILY  16 g  2  . fluticasone (FLONASE) 50 MCG/ACT nasal spray Place 2 sprays into both nostrils daily.  15.8 g  6  . GuaiFENesin (MUCINEX PO) Take 600 mg by mouth as needed.      . metoprolol succinate (TOPROL-XL) 25 MG 24 hr tablet Take 1 tablet (25 mg total) by mouth daily.  90 tablet  1  . multivitamin-lutein (OCUVITE-LUTEIN) CAPS capsule Take 1 capsule by mouth 2 (two) times daily.      . nitroGLYCERIN (NITROSTAT) 0.4 MG SL tablet Place 1 tablet (0.4 mg total) under the tongue every 5 (five) minutes as needed.  25 tablet  6  . predniSONE (DELTASONE) 1 MG tablet Take 2 mg by mouth daily with breakfast.       . vitamin B-12 (CYANOCOBALAMIN) 1000 MCG tablet Take 1,000 mcg by mouth daily.       No current facility-administered medications on file prior to visit.    BP 120/70  Pulse 72  Temp(Src) 98 F (36.7 C) (Oral)  Resp 20  Ht 5' 9.5" (1.765 m)  Wt 175  lb (79.379 kg)  BMI 25.48 kg/m2  SpO2 97%     Review of Systems  Constitutional: Negative for fever, chills, appetite change and fatigue.  HENT: Negative for congestion, dental problem, ear pain, hearing loss, sore throat, tinnitus, trouble swallowing and voice change.   Eyes: Negative for pain, discharge and visual disturbance.  Respiratory: Negative for cough, chest tightness, wheezing and stridor.   Cardiovascular: Negative for chest pain, palpitations and leg swelling.  Gastrointestinal: Negative for nausea, vomiting, abdominal pain, diarrhea, constipation, blood in stool and abdominal distention.  Genitourinary: Negative for urgency, hematuria, flank pain, discharge, difficulty urinating and genital sores.  Musculoskeletal: Negative for arthralgias, back pain, gait problem, joint swelling, myalgias and neck stiffness.  Skin: Negative for rash.  Neurological: Negative for dizziness, syncope, speech difficulty, weakness, numbness and headaches.  Hematological: Negative for adenopathy. Does not bruise/bleed easily.  Psychiatric/Behavioral: Negative for behavioral problems and dysphoric mood. The patient is not nervous/anxious.        Objective:   Physical Exam  Constitutional: He is oriented to person, place, and time. He appears well-developed.  HENT:  Head: Normocephalic.  Right Ear: External ear normal.  Left Ear: External ear normal.  Eyes: Conjunctivae and EOM are normal.  Neck: Normal range of motion.  Right carotid bruit  Cardiovascular: Normal rate and normal heart sounds.   Pulmonary/Chest: Breath sounds normal.  Abdominal: Bowel sounds are normal.  Musculoskeletal: Normal range of motion. He exhibits no edema and no tenderness.  Neurological: He is alert and oriented to person, place, and time.  Psychiatric: He has a normal mood and affect. His behavior is normal.          Assessment & Plan:   Right carotid bruit.  This was discussed at length with the  patient.  Will schedule follow-up.  Carotid artery Doppler studies Coronary artery disease Dyslipidemia

## 2014-01-23 ENCOUNTER — Ambulatory Visit (HOSPITAL_COMMUNITY): Payer: Medicare Other | Attending: Internal Medicine | Admitting: *Deleted

## 2014-01-23 DIAGNOSIS — R0989 Other specified symptoms and signs involving the circulatory and respiratory systems: Secondary | ICD-10-CM | POA: Diagnosis not present

## 2014-01-23 DIAGNOSIS — I6523 Occlusion and stenosis of bilateral carotid arteries: Secondary | ICD-10-CM | POA: Diagnosis not present

## 2014-01-23 DIAGNOSIS — Z87891 Personal history of nicotine dependence: Secondary | ICD-10-CM | POA: Insufficient documentation

## 2014-01-23 DIAGNOSIS — I2510 Atherosclerotic heart disease of native coronary artery without angina pectoris: Secondary | ICD-10-CM | POA: Diagnosis not present

## 2014-01-23 DIAGNOSIS — E785 Hyperlipidemia, unspecified: Secondary | ICD-10-CM | POA: Diagnosis not present

## 2014-01-23 DIAGNOSIS — I10 Essential (primary) hypertension: Secondary | ICD-10-CM | POA: Insufficient documentation

## 2014-01-23 NOTE — Progress Notes (Signed)
Carotid duplex complete 

## 2014-01-31 ENCOUNTER — Other Ambulatory Visit (HOSPITAL_COMMUNITY): Payer: Medicare Other

## 2014-02-11 ENCOUNTER — Telehealth: Payer: Self-pay | Admitting: Cardiology

## 2014-02-11 NOTE — Telephone Encounter (Signed)
Pt need his Metoprolol called in. Please call to [phone]

## 2014-02-12 ENCOUNTER — Other Ambulatory Visit: Payer: Self-pay

## 2014-02-12 MED ORDER — METOPROLOL SUCCINATE ER 25 MG PO TB24: 25.0000 mg | ORAL_TABLET | Freq: Every day | ORAL | Status: DC

## 2014-02-15 ENCOUNTER — Other Ambulatory Visit: Payer: Self-pay

## 2014-02-20 ENCOUNTER — Telehealth: Payer: Self-pay | Admitting: Gastroenterology

## 2014-02-20 MED ORDER — ESOMEPRAZOLE MAGNESIUM 40 MG PO CPDR: 40.0000 mg | DELAYED_RELEASE_CAPSULE | Freq: Every day | ORAL | Status: DC

## 2014-02-20 NOTE — Telephone Encounter (Signed)
I have spoken to patient to advise that he needs an office visit. He has scheduled appt with Dr Fuller Plan for 03/25/14. I have sent enough medication to his pharmacy until his appointment.

## 2014-02-28 DIAGNOSIS — H3532 Exudative age-related macular degeneration: Secondary | ICD-10-CM | POA: Diagnosis not present

## 2014-03-01 DIAGNOSIS — H3531 Nonexudative age-related macular degeneration: Secondary | ICD-10-CM | POA: Diagnosis not present

## 2014-03-01 DIAGNOSIS — H3532 Exudative age-related macular degeneration: Secondary | ICD-10-CM | POA: Diagnosis not present

## 2014-03-01 DIAGNOSIS — H43813 Vitreous degeneration, bilateral: Secondary | ICD-10-CM | POA: Diagnosis not present

## 2014-03-06 ENCOUNTER — Ambulatory Visit: Payer: Medicare Other | Attending: Specialist | Admitting: Occupational Therapy

## 2014-03-06 DIAGNOSIS — H53419 Scotoma involving central area, unspecified eye: Secondary | ICD-10-CM

## 2014-03-06 DIAGNOSIS — H53452 Other localized visual field defect, left eye: Secondary | ICD-10-CM | POA: Insufficient documentation

## 2014-03-06 DIAGNOSIS — H3530 Unspecified macular degeneration: Secondary | ICD-10-CM | POA: Diagnosis not present

## 2014-03-06 DIAGNOSIS — R4181 Age-related cognitive decline: Secondary | ICD-10-CM | POA: Diagnosis not present

## 2014-03-06 NOTE — Therapy (Signed)
[facility] [address] [place], [place], [address] Phone: [phone]   Fax:  [phone]  Occupational Therapy Evaluation  Patient Details  Name: [name] MRN: [id number] Date of Birth: [birth year]  Encounter Date: 03/06/2014      OT End of Session - 03/06/14 1525    Visit Number 1   Number of Visits 1  Eval/ tx day of eval   Authorization Type MCR   Activity Tolerance Patient tolerated treatment well   Behavior During Therapy [facility] for tasks assessed/performed      Past Medical History  Diagnosis Date  . IBS (irritable bowel syndrome)   . Hemorrhoids   . Diverticulosis   . GERD (gastroesophageal reflux disease)   . Tubular adenoma of colon 07/1998  . Anxiety and depression   . Elevated prostate specific antigen (PSA)   . Vitamin B12 deficiency   . UTI (lower urinary tract infection)   . Arthritis   . Sciatica   . Anemia   . TIA (transient ischemic attack)   . Peptic ulcer disease   . Hyperlipidemia   . CAD (coronary artery disease)     PCI 2006, Stress Perfusion Study 2011  . Hypertension   . Rheumatoid arthritis(714.0)   . Polymyalgia     Past Surgical History  Procedure Laterality Date  . Ptca  2006    Stenting of proximal LAD w/ 75 % stenosis reduced to 0%  . Cataract extraction Bilateral   . Cholecystectomy  2001  . Hernia repair    . Hip fracture surgery  03/2007    Right  . Salivary stone removal  11/2007  . Radiology with anesthesia N/A 11/16/2012    Procedure: EMBOLIZATION  OF FISTULA;  Surgeon: Rob Hickman, MD;  Location: [place];  Service: Radiology;  Laterality: N/A;  . Dural fistula embolition with onyx  12/07/12    There were no vitals taken for this visit.  Visit Diagnosis:  Scotoma involving central area, unspecified laterality      Subjective Assessment - 03/06/14 1523    Currently in Pain? 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   Onset Date --  10/2012   Home  Environment   Family/patient expects to be discharged to: Private residence   Living Arrangements Spouse/significant other   Type of [place] One level   Alternate Level Stairs - Number of Steps 1   ADL   ADL comments Pt is modified independent with basic ADLs, light home management, and he still drives.   Vision - History   Baseline Vision Bifocals   Visual History Macular degeneration   Patient Visual Report Central vision impairment;Eye fatigue/eye pain/headache   Vision Assessment   Vision Assessment Vision tested   Visual Acuity Per MD/OD report   Per MD/OD Report OD 20/30, OU 20/60   Reading Acuity 20/63   Visual Fields No apparent deficits   Acuity Visual acuity right eye (comment);Visual acuity left  eye (comment)   Cognition   Overall Cognitive Status Impaired/Different from baseline   Mini Mental State Exam  22/30 mild intellectual impairment   Area of Impairment Memory   Memory Comments Pt reports short term memory deficits.            OT Education - March 30, 2014 1524    Education provided Yes   Education Details Pt was educated regarding use of 3X stand and handheld magnifiers and 2x gooseneck magnfier   Person(s) Educated Patient;Spouse   Methods Explanation;Demonstration   Comprehension Verbalized understanding;Returned demonstration       Prior to evaluation, and education pt. Reported increased difficulty with reading newsprint.  Pt was educated regarding: macular degeneration, use of magnification and compensatory strategies for short term memory deficits.  Pt  demonstrated ability to spot read and read continuous text with 3x stand and handheld magnifiers and 2x gooseneck magnifier following education.   Pt/ wife plan to pursue 3x handheld magnfier and 2x gooseneck magnifier.        Plan - 03/30/14 1526    Clinical Impression Statement Pt with macular degeneration and hx of brain surgery for dural fistula presents with visual impairment which impedes ability to read and perform daily activities.     Rehab Potential Good   Clinical Impairments Affecting Rehab Potential vision, cognition   OT Frequency Other (comment)  Eval plus tx day of eval   Plan Pt was provided with information regarding purchase of AE, no further OT needed.   Consulted and Agree with Plan of Care Patient;Family member/caregiver   Family Member Consulted wife          G-Codes - 30-Mar-2014 1623    Functional Assessment Tool Used reading   Functional Limitation Self care   Self Care Current Status [phone]) At least 1 percent but less than 20 percent impaired, limited or restricted   Self Care Goal Status ([id number]) At least 1 percent but less than 20 percent impaired, limited or restricted   Self Care Discharge Status [phone]) At least 1 percent but less than 20 percent impaired, limited or restricted     No goals were set as pt was provided with evaluation and treatment on the day of evaluation.                        Problem List Patient Active Problem List   Diagnosis Date Noted  . Carotid artery stenosis 01/22/2014  . Sigmoid sinus, transverse sinus Fistula 11/10/2012  . CAP (community acquired pneumonia) 11/07/2012  . Shingles 11/07/2012  . B12 deficiency 04/29/2011  . Polymyalgia rheumatica 04/29/2011  . TMJ syndrome 11/02/2010  . Memory loss 04/16/2010  . Cough 04/16/2010  . ELEVATED BP READING WITHOUT DX HYPERTENSION 02/05/2010  . ROSACEA 07/23/2009  . DYSPHAGIA UNSPECIFIED 07/23/2009  . Hypothyroidism 07/09/2009  . MACULAR  DEGENERATION 07/09/2009  . Anemia 07/09/2009  . DIVERTICULAR DISEASE 06/25/2008  . GERD 2008-03-30  . PERSONAL HX COLONIC POLYPS Mar 30, 2008  . ANXIETY DEPRESSION 02/27/2008  . ELEVATED PROSTATE SPECIFIC ANTIGEN 02/27/2008  . ARTHRITIS, CERVICAL SPINE 01/23/2008  . Dyslipidemia 10/19/2006  . Coronary atherosclerosis 10/19/2006  . PEPTIC ULCER DISEASE 10/19/2006  . TRANSIENT ISCHEMIC ATTACK, HX OF 10/19/2006    RINE,KATHRYN 03/30/2014, 4:34 PM  Theone Murdoch, OTR/L [phone] Phone: [phone] 4:36 PM 03/30/2014

## 2014-03-07 DIAGNOSIS — H43813 Vitreous degeneration, bilateral: Secondary | ICD-10-CM | POA: Diagnosis not present

## 2014-03-07 DIAGNOSIS — Z961 Presence of intraocular lens: Secondary | ICD-10-CM | POA: Diagnosis not present

## 2014-03-07 DIAGNOSIS — H3531 Nonexudative age-related macular degeneration: Secondary | ICD-10-CM | POA: Diagnosis not present

## 2014-03-07 DIAGNOSIS — H3532 Exudative age-related macular degeneration: Secondary | ICD-10-CM | POA: Diagnosis not present

## 2014-03-20 ENCOUNTER — Encounter: Payer: Self-pay | Admitting: *Deleted

## 2014-03-25 ENCOUNTER — Encounter: Payer: Self-pay | Admitting: Gastroenterology

## 2014-03-25 ENCOUNTER — Ambulatory Visit (INDEPENDENT_AMBULATORY_CARE_PROVIDER_SITE_OTHER): Payer: Medicare Other | Admitting: Gastroenterology

## 2014-03-25 VITALS — BP 150/70 | HR 84 | Ht 69.0 in | Wt 176.4 lb

## 2014-03-25 DIAGNOSIS — H9311 Tinnitus, right ear: Secondary | ICD-10-CM | POA: Diagnosis not present

## 2014-03-25 DIAGNOSIS — R053 Chronic cough: Secondary | ICD-10-CM

## 2014-03-25 DIAGNOSIS — J310 Chronic rhinitis: Secondary | ICD-10-CM | POA: Diagnosis not present

## 2014-03-25 DIAGNOSIS — R6889 Other general symptoms and signs: Secondary | ICD-10-CM

## 2014-03-25 DIAGNOSIS — H905 Unspecified sensorineural hearing loss: Secondary | ICD-10-CM | POA: Diagnosis not present

## 2014-03-25 DIAGNOSIS — K219 Gastro-esophageal reflux disease without esophagitis: Secondary | ICD-10-CM

## 2014-03-25 DIAGNOSIS — R198 Other specified symptoms and signs involving the digestive system and abdomen: Secondary | ICD-10-CM

## 2014-03-25 DIAGNOSIS — R05 Cough: Secondary | ICD-10-CM | POA: Diagnosis not present

## 2014-03-25 DIAGNOSIS — R0989 Other specified symptoms and signs involving the circulatory and respiratory systems: Secondary | ICD-10-CM

## 2014-03-25 DIAGNOSIS — I2510 Atherosclerotic heart disease of native coronary artery without angina pectoris: Secondary | ICD-10-CM

## 2014-03-25 MED ORDER — OMEPRAZOLE 40 MG PO CPDR: 40.0000 mg | DELAYED_RELEASE_CAPSULE | Freq: Two times a day (BID) | ORAL | Status: DC

## 2014-03-25 NOTE — Progress Notes (Signed)
    History of Present Illness: This is an 80 year old male complaining of an ongoing cough that is often related to meals, mucus in his throat, throat clearing, intermittent voice changes. He is accompanied by his daughter and son today. MBSS performed in 2014 was unremarkable. He was evaluated by Dr. Melvyn Novas one year ago. His complaints are chronic and unchanged. He was previously treated with a PPI twice daily however over the past few years he's been on Nexium once daily. He stopped Nexium recently and his symptoms heartburn substantially worsened. He relates that his insurance no longer will cover Nexium and he would like an alternate medication.  Current Medications, Allergies, Past Medical History, Past Surgical History, Family History and Social History were reviewed in [employer] record.  Physical Exam: General: Well developed , well nourished, no acute distress Head: Normocephalic and atraumatic Eyes:  sclerae anicteric, EOMI Ears: Normal auditory acuity Mouth: No deformity or lesions Lungs: Clear throughout to auscultation Heart: Regular rate and rhythm; no murmurs, rubs or bruits Abdomen: Soft, non tender and non distended. No masses, hepatosplenomegaly or hernias noted. Normal Bowel sounds Musculoskeletal: Symmetrical with no gross deformities  Pulses:  Normal pulses noted Extremities: No clubbing, cyanosis, edema or deformities noted Neurological: Alert oriented x 4, grossly nonfocal Psychological:  Alert and cooperative. Normal mood and affect  Assessment and Recommendations:  1. Chronic cough, throat clearing, globus sensation, heartburn. Possible LPR, upper airway cough syndrome, allergies. Only the heartburn appears to respond to PPIs. The other symptoms have not responded to standard treatments for acid reflux. The patient, his daughter and his son all had multiple questions and concerns. I attempted to address all of their questions and concerns. The  office visit lasted approximately 35 minutes. I suggested that we try a PPI twice daily with intensify antireflux measures for 2-3 months and assess the response. His daughter was concerned about the osteoporosis risk of this plan. I advised him to return to his primary physician to further assess and manage his osteoporosis. Patient wondered whether repeat endoscopy be helpful however he underwent endoscopy in May 2011 that showed only mild gastritis. There was no evidence of esophagitis or Barrett's esophagus so I do not feel that a repeat endoscopy was necessary. After considerable discussion all were in agreement with the plan for esomeprazole 40 mg twice daily along with intensified antireflux measures for 2 months. I have advised him to elevate the head of his bed 4 inches for long-term management of GERD. If his symptoms are unchanged he will resume esomeprazole 40 mg once daily. I advised him to return to Dr. Melvyn Novas for reevaluation and consider ENT evaluation.

## 2014-03-25 NOTE — Patient Instructions (Addendum)
Stop taking the Nexium. Pick up the prescription for omeprazole 40 mg and take one tablet by mouth twice daily 30 minutes before breakfast and dinner.  Patient advised to avoid spicy, acidic, citrus, chocolate, mints, fruit and fruit juices.  Limit the intake of caffeine, alcohol and Soda.  Don't exercise too soon after eating.  Don't lie down within 3-4 hours of eating.  Elevate the head of your bed with 4 inch bed blocks.   Thank you for choosing me and [facility].  Pricilla Riffle. Dagoberto Ligas., MD., [facility] Regal

## 2014-04-10 ENCOUNTER — Ambulatory Visit: Payer: Medicare Other | Admitting: Internal Medicine

## 2014-04-11 DIAGNOSIS — M8580 Other specified disorders of bone density and structure, unspecified site: Secondary | ICD-10-CM | POA: Diagnosis not present

## 2014-04-11 DIAGNOSIS — M353 Polymyalgia rheumatica: Secondary | ICD-10-CM | POA: Diagnosis not present

## 2014-04-11 DIAGNOSIS — H532 Diplopia: Secondary | ICD-10-CM | POA: Diagnosis not present

## 2014-04-11 DIAGNOSIS — E559 Vitamin D deficiency, unspecified: Secondary | ICD-10-CM | POA: Diagnosis not present

## 2014-04-29 ENCOUNTER — Ambulatory Visit (INDEPENDENT_AMBULATORY_CARE_PROVIDER_SITE_OTHER): Payer: Medicare Other | Admitting: Internal Medicine

## 2014-04-29 ENCOUNTER — Encounter: Payer: Self-pay | Admitting: Internal Medicine

## 2014-04-29 VITALS — BP 120/68 | HR 74 | Temp 98.0°F | Wt 181.0 lb

## 2014-04-29 DIAGNOSIS — M353 Polymyalgia rheumatica: Secondary | ICD-10-CM | POA: Diagnosis not present

## 2014-04-29 DIAGNOSIS — E039 Hypothyroidism, unspecified: Secondary | ICD-10-CM | POA: Diagnosis not present

## 2014-04-29 DIAGNOSIS — F341 Dysthymic disorder: Secondary | ICD-10-CM

## 2014-04-29 DIAGNOSIS — R413 Other amnesia: Secondary | ICD-10-CM

## 2014-04-29 MED ORDER — ESCITALOPRAM OXALATE 10 MG PO TABS: 10.0000 mg | ORAL_TABLET | Freq: Every day | ORAL | Status: DC

## 2014-04-29 NOTE — Progress Notes (Signed)
Subjective:    Patient ID: [name], male    DOB: [birth year], 81 y.o.   MRN: [id number]  HPI  81 year old patient who is seen today in follow-up.  He continues to complain of chronic cough that occurs often following meals.  He did have a full swallow evaluation performed in late 2014.  He has been seen by ENT and GI.  He does have some rhinorrhea and a sense of mucus and constant clearing of his throat.  Remains on Mucinex and nasal steroids. He has a long history of anxiety, depression, some cognitive impairment, which according to his wife has worsened.  She feels he has also become much more depressed, anxious and easily angered He does have a history of polymyalgia rheumatica.  He has increased his prednisone from 1 mg to 5 mg due to sense of unwellness.  He has hypothyroidism and a history of anemia  Past Medical History  Diagnosis Date  . IBS (irritable bowel syndrome)   . Hemorrhoids   . Diverticulosis   . GERD (gastroesophageal reflux disease)   . Tubular adenoma of colon 07/1998  . Anxiety and depression   . Elevated prostate specific antigen (PSA)   . Vitamin B12 deficiency   . UTI (lower urinary tract infection)   . Arthritis   . Sciatica   . Anemia   . TIA (transient ischemic attack)   . Peptic ulcer disease   . Hyperlipidemia   . CAD (coronary artery disease)     PCI 2006, Stress Perfusion Study 2011  . Hypertension   . Rheumatoid arthritis(714.0)   . Polymyalgia   . Hiatal hernia     History   Social History  . Marital Status: Married    Spouse Name: N/A    Number of Children: 1  . Years of Education: N/A   Occupational History  . Retired English as a second language teacher    Social History Main Topics  . Smoking status: Former Smoker -- 0.25 packs/day for 20 years    Types: Cigarettes    Quit date: 03/30/1971  . Smokeless tobacco: Never Used  . Alcohol Use: No     Comment: occasional  . Drug Use: No  . Sexual Activity: Not Currently   Other Topics Concern  . Not on  file   Social History Narrative    Past Surgical History  Procedure Laterality Date  . Ptca  2006    Stenting of proximal LAD w/ 75 % stenosis reduced to 0%  . Cataract extraction Bilateral   . Cholecystectomy  2001  . Hernia repair    . Hip fracture surgery  03/2007    Right  . Salivary stone removal  11/2007  . Radiology with anesthesia N/A 11/16/2012    Procedure: EMBOLIZATION  OF FISTULA;  Surgeon: Rob Hickman, MD;  Location: [facility];  Service: Radiology;  Laterality: N/A;  . 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 Colon cancer Maternal Uncle 39    ish  . Coronary artery disease Father     Allergies  Allergen Reactions  . Amoxicillin     REACTION: unspecified  . Doxycycline   . Erythromycin     REACTION: unspecified  . Penicillins     rash  . Sulfamethoxazole-Trimethoprim     unknown    Current Outpatient Prescriptions on File Prior to Visit  Medication Sig Dispense Refill  . aspirin 81 MG tablet Take 81  mg by mouth daily.    . Cholecalciferol (VITAMIN D PO) Take 2,000 mg by mouth daily.    . Coenzyme Q10 (CO Q-10) 100 MG CAPS Take 1 capsule by mouth daily.    . fluticasone (FLONASE) 50 MCG/ACT nasal spray Place 2 sprays into both nostrils daily. 15.8 g 6  . GuaiFENesin (MUCINEX PO) Take 600 mg by mouth as needed.    . metoprolol succinate (TOPROL-XL) 25 MG 24 hr tablet Take 1 tablet (25 mg total) by mouth daily. 90 tablet 1  . multivitamin-lutein (OCUVITE-LUTEIN) CAPS capsule Take 1 capsule by mouth 2 (two) times daily.    . nitroGLYCERIN (NITROSTAT) 0.4 MG SL tablet Place 1 tablet (0.4 mg total) under the tongue every 5 (five) minutes as needed. 25 tablet 6  . omeprazole (PRILOSEC) 40 MG capsule Take 1 capsule (40 mg total) by mouth 2 (two) times daily. 60 capsule 11  . vitamin B-12 (CYANOCOBALAMIN) 1000 MCG tablet Take 1,000 mcg by mouth daily.     No current facility-administered medications on  file prior to visit.    BP 120/68 mmHg  Pulse 74  Temp(Src) 98 F (36.7 C) (Oral)  Wt 181 lb (82.101 kg)  SpO2 98%      Review of Systems  Constitutional: Positive for activity change, appetite change and fatigue. Negative for fever and chills.  HENT: Positive for postnasal drip and rhinorrhea. Negative for congestion, dental problem, ear pain, hearing loss, sore throat, tinnitus, trouble swallowing and voice change.   Eyes: Negative for pain, discharge and visual disturbance.  Respiratory: Positive for cough. Negative for chest tightness, wheezing and stridor.   Cardiovascular: Negative for chest pain, palpitations and leg swelling.  Gastrointestinal: Negative for nausea, vomiting, abdominal pain, diarrhea, constipation, blood in stool and abdominal distention.  Genitourinary: Negative for urgency, hematuria, flank pain, discharge, difficulty urinating and genital sores.  Musculoskeletal: Negative for myalgias, back pain, joint swelling, arthralgias, gait problem and neck stiffness.  Skin: Negative for rash.  Neurological: Negative for dizziness, syncope, speech difficulty, weakness, numbness and headaches.  Hematological: Negative for adenopathy. Does not bruise/bleed easily.  Psychiatric/Behavioral: Positive for behavioral problems, dysphoric mood and agitation. The patient is nervous/anxious.        Objective:   Physical Exam  Constitutional: He is oriented to person, place, and time. He appears well-developed.  HENT:  Head: Normocephalic.  Right Ear: External ear normal.  Left Ear: External ear normal.  Mouth/Throat: Oropharynx is clear and moist.  Eyes: Conjunctivae and EOM are normal.  Neck: Normal range of motion.  Cardiovascular: Normal rate and normal heart sounds.   Pulmonary/Chest: Breath sounds normal.  Abdominal: Bowel sounds are normal.  Musculoskeletal: Normal range of motion. He exhibits no edema or tenderness.  Neurological: He is alert and oriented to  person, place, and time.  Psychiatric: His behavior is normal. Thought content normal.  Dysphoric mood          Assessment & Plan:  Anxiety, depression Probable mild cognitive impairment GERD Upper airway cough syndrome Polymyalgia rheumatica  We'll decrease prednisone to 2.5 milligrams daily and check sedimentation rate.  He was counseled on proper compliance with his medications Anxiety, depression.  We'll place on Lexapro 10.  Recheck 4 weeks We'll check screening lab to include CBC and TSH

## 2014-04-29 NOTE — Patient Instructions (Signed)
Return in 4 weeks for follow-up 

## 2014-04-29 NOTE — Progress Notes (Signed)
Pre visit review using our clinic review tool, if applicable. No additional management support is needed unless otherwise documented below in the visit note. 

## 2014-04-30 LAB — COMPREHENSIVE METABOLIC PANEL
ALBUMIN: 3.7 g/dL (ref 3.5–5.2)
ALK PHOS: 81 U/L (ref 39–117)
ALT: 18 U/L (ref 0–53)
AST: 19 U/L (ref 0–37)
BUN: 22 mg/dL (ref 6–23)
CALCIUM: 9.2 mg/dL (ref 8.4–10.5)
CO2: 28 meq/L (ref 19–32)
Chloride: 109 mEq/L (ref 96–112)
Creatinine, Ser: 1.39 mg/dL (ref 0.40–1.50)
GFR: 51.04 mL/min — ABNORMAL LOW (ref >60.00)
Glucose, Bld: 103 mg/dL — ABNORMAL HIGH (ref 70–99)
POTASSIUM: 4.4 meq/L (ref 3.5–5.1)
Sodium: 141 mEq/L (ref 135–145)
Total Bilirubin: 0.3 mg/dL (ref 0.2–1.2)
Total Protein: 6.3 g/dL (ref 6.0–8.3)

## 2014-04-30 LAB — CBC WITH DIFFERENTIAL/PLATELET
BASOS PCT: 0.4 % (ref 0.0–3.0)
Basophils Absolute: 0 10*3/uL (ref 0.0–0.1)
Eosinophils Absolute: 0.1 10*3/uL (ref 0.0–0.7)
Eosinophils Relative: 1 % (ref 0.0–5.0)
HCT: 41.3 % (ref 39.0–52.0)
Hemoglobin: 13.9 g/dL (ref 13.0–17.0)
Lymphocytes Relative: 17.6 % (ref 12.0–46.0)
Lymphs Abs: 1.1 10*3/uL (ref 0.7–4.0)
MCHC: 33.6 g/dL (ref 30.0–36.0)
MCV: 89 fl (ref 78.0–100.0)
MONO ABS: 0.4 10*3/uL (ref 0.1–1.0)
Monocytes Relative: 6.9 % (ref 3.0–12.0)
Neutro Abs: 4.4 10*3/uL (ref 1.4–7.7)
Neutrophils Relative %: 74.1 % (ref 43.0–77.0)
PLATELETS: 160 10*3/uL (ref 150.0–400.0)
RBC: 4.65 Mil/uL (ref 4.22–5.81)
RDW: 14.3 % (ref 11.5–15.5)
WBC: 6 10*3/uL (ref 4.0–10.5)

## 2014-04-30 LAB — SEDIMENTATION RATE: Sed Rate: 7 mm/hr (ref 0–22)

## 2014-04-30 LAB — TSH: TSH: 1.03 u[IU]/mL (ref 0.35–4.50)

## 2014-05-08 ENCOUNTER — Telehealth: Payer: Self-pay | Admitting: Internal Medicine

## 2014-05-08 NOTE — Telephone Encounter (Signed)
Please call/notify patient that lab/test/procedure is normal 

## 2014-05-08 NOTE — Telephone Encounter (Signed)
Pt calling for lab results from 2/1. Please advise.

## 2014-05-08 NOTE — Telephone Encounter (Signed)
Pt needs blood work results °

## 2014-05-09 NOTE — Telephone Encounter (Signed)
Spoke to pt, told him lab results were normal. Pt verbalized understanding.

## 2014-05-28 ENCOUNTER — Ambulatory Visit (INDEPENDENT_AMBULATORY_CARE_PROVIDER_SITE_OTHER): Payer: Medicare Other | Admitting: Internal Medicine

## 2014-05-28 ENCOUNTER — Encounter: Payer: Self-pay | Admitting: Internal Medicine

## 2014-05-28 VITALS — BP 128/70 | HR 71 | Temp 98.2°F | Resp 18 | Ht 69.0 in | Wt 173.0 lb

## 2014-05-28 DIAGNOSIS — F418 Other specified anxiety disorders: Secondary | ICD-10-CM | POA: Diagnosis not present

## 2014-05-28 MED ORDER — PREDNISONE 5 MG PO TABS: 2.5000 mg | ORAL_TABLET | Freq: Every day | ORAL | Status: DC

## 2014-05-28 NOTE — Progress Notes (Signed)
Pre visit review using our clinic review tool, if applicable. No additional management support is needed unless otherwise documented below in the visit note. 

## 2014-05-28 NOTE — Patient Instructions (Signed)
Call or return to clinic prn if these symptoms worsen or fail to improve as anticipated.

## 2014-05-28 NOTE — Progress Notes (Signed)
Subjective:    Patient ID: [name], male    DOB: [birth year], 81 y.o.   MRN: [id number]  HPI 81 year old patient who is seen today in follow-up.  He has a history of anxiety, depression, was placed on Lexapro.  4 weeks ago.  He also has a history of PMR.  He is not aware of any major clinical change, but he seems much calmer and really has very few complaints today in the past.  He has really been polysymptomatic.  Denies any further cough or throat clearing  Both he and his wife are quite stressed about the prospects of selling the house and relocating to [place]  Past Medical History  Diagnosis Date  . IBS (irritable bowel syndrome)   . Hemorrhoids   . Diverticulosis   . GERD (gastroesophageal reflux disease)   . Tubular adenoma of colon 07/1998  . Anxiety and depression   . Elevated prostate specific antigen (PSA)   . Vitamin B12 deficiency   . UTI (lower urinary tract infection)   . Arthritis   . Sciatica   . Anemia   . TIA (transient ischemic attack)   . Peptic ulcer disease   . Hyperlipidemia   . CAD (coronary artery disease)     PCI 2006, Stress Perfusion Study 2011  . Hypertension   . Rheumatoid arthritis(714.0)   . Polymyalgia   . Hiatal hernia     History   Social History  . Marital Status: Married    Spouse Name: N/A  . Number of Children: 1  . Years of Education: N/A   Occupational History  . Retired English as a second language teacher    Social History Main Topics  . Smoking status: Former Smoker -- 0.25 packs/day for 20 years    Types: Cigarettes    Quit date: 03/30/1971  . Smokeless tobacco: Never Used  . Alcohol Use: No     Comment: occasional  . Drug Use: No  . Sexual Activity: Not Currently   Other Topics Concern  . Not on file   Social History Narrative    Past Surgical History  Procedure Laterality Date  . Ptca  2006    Stenting of proximal LAD w/ 75 % stenosis reduced to 0%  . Cataract extraction Bilateral   . Cholecystectomy  2001  . Hernia repair      . Hip fracture surgery  03/2007    Right  . Salivary stone removal  11/2007  . Radiology with anesthesia N/A 11/16/2012    Procedure: EMBOLIZATION  OF FISTULA;  Surgeon: Rob Hickman, MD;  Location: [facility];  Service: Radiology;  Laterality: N/A;  . 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 Colon cancer Maternal Uncle 27    ish  . Coronary artery disease Father     Allergies  Allergen Reactions  . Amoxicillin     REACTION: unspecified  . Doxycycline   . Erythromycin     REACTION: unspecified  . Penicillins     rash  . Sulfamethoxazole-Trimethoprim     unknown    Current Outpatient Prescriptions on File Prior to Visit  Medication Sig Dispense Refill  . aspirin 81 MG tablet Take 81 mg by mouth daily.    . Cholecalciferol (VITAMIN D PO) Take 2,000 mg by mouth daily.    . Coenzyme Q10 (CO Q-10) 100 MG CAPS Take 1 capsule by mouth daily.    Marland Kitchen escitalopram (LEXAPRO) 10  MG tablet Take 1 tablet (10 mg total) by mouth daily. 90 tablet 3  . fluticasone (FLONASE) 50 MCG/ACT nasal spray Place 2 sprays into both nostrils daily. 15.8 g 6  . GuaiFENesin (MUCINEX PO) Take 600 mg by mouth as needed.    . metoprolol succinate (TOPROL-XL) 25 MG 24 hr tablet Take 1 tablet (25 mg total) by mouth daily. 90 tablet 1  . multivitamin-lutein (OCUVITE-LUTEIN) CAPS capsule Take 1 capsule by mouth 2 (two) times daily.    . nitroGLYCERIN (NITROSTAT) 0.4 MG SL tablet Place 1 tablet (0.4 mg total) under the tongue every 5 (five) minutes as needed. 25 tablet 6  . omeprazole (PRILOSEC) 40 MG capsule Take 1 capsule (40 mg total) by mouth 2 (two) times daily. 60 capsule 11  . predniSONE (DELTASONE) 5 MG tablet     . vitamin B-12 (CYANOCOBALAMIN) 1000 MCG tablet Take 1,000 mcg by mouth daily.     No current facility-administered medications on file prior to visit.    BP 128/70 mmHg  Pulse 71  Temp(Src) 98.2 F (36.8 C) (Oral)  Resp 18  Ht 5' 9"   (1.753 m)  Wt 173 lb (78.472 kg)  BMI 25.54 kg/m2  SpO2 97%      Review of Systems  Constitutional: Negative for fever, chills, appetite change and fatigue.  HENT: Negative for congestion, dental problem, ear pain, hearing loss, sore throat, tinnitus, trouble swallowing and voice change.   Eyes: Negative for pain, discharge and visual disturbance.  Respiratory: Negative for cough, chest tightness, wheezing and stridor.   Cardiovascular: Negative for chest pain, palpitations and leg swelling.  Gastrointestinal: Negative for nausea, vomiting, abdominal pain, diarrhea, constipation, blood in stool and abdominal distention.  Genitourinary: Negative for urgency, hematuria, flank pain, discharge, difficulty urinating and genital sores.  Musculoskeletal: Negative for myalgias, back pain, joint swelling, arthralgias, gait problem and neck stiffness.  Skin: Negative for rash.  Neurological: Negative for dizziness, syncope, speech difficulty, weakness, numbness and headaches.  Hematological: Negative for adenopathy. Does not bruise/bleed easily.  Psychiatric/Behavioral: Positive for behavioral problems. Negative for dysphoric mood. The patient is nervous/anxious.        Objective:   Physical Exam  Constitutional: He is oriented to person, place, and time. He appears well-developed.  HENT:  Head: Normocephalic.  Right Ear: External ear normal.  Left Ear: External ear normal.  Eyes: Conjunctivae and EOM are normal.  Neck: Normal range of motion.  Cardiovascular: Normal rate and normal heart sounds.   Pulmonary/Chest: Breath sounds normal.  Abdominal: Bowel sounds are normal.  Musculoskeletal: Normal range of motion. He exhibits no edema or tenderness.  Neurological: He is alert and oriented to person, place, and time.  Psychiatric: He has a normal mood and affect. His behavior is normal.          Assessment & Plan:   Anxiety, depression.  Seems clinically improved.  No change in  therapy.  We'll reassess in 6 weeks Polymyalgia rheumatica.  ESR normal.  We'll continue present 2.5 milligram dose of prednisone

## 2014-06-10 ENCOUNTER — Observation Stay (HOSPITAL_COMMUNITY)
Admission: EM | Admit: 2014-06-10 | Discharge: 2014-06-11 | Disposition: A | Discharge status: Home / Self Care | Payer: Medicare Other | Attending: Internal Medicine | Admitting: Internal Medicine

## 2014-06-10 ENCOUNTER — Encounter (HOSPITAL_COMMUNITY): Payer: Self-pay

## 2014-06-10 ENCOUNTER — Telehealth: Payer: Self-pay | Admitting: Internal Medicine

## 2014-06-10 ENCOUNTER — Other Ambulatory Visit: Payer: Self-pay

## 2014-06-10 ENCOUNTER — Emergency Department (HOSPITAL_COMMUNITY): Payer: Medicare Other

## 2014-06-10 DIAGNOSIS — R0789 Other chest pain: Secondary | ICD-10-CM | POA: Diagnosis not present

## 2014-06-10 DIAGNOSIS — Z9842 Cataract extraction status, left eye: Secondary | ICD-10-CM | POA: Diagnosis not present

## 2014-06-10 DIAGNOSIS — F329 Major depressive disorder, single episode, unspecified: Secondary | ICD-10-CM | POA: Diagnosis not present

## 2014-06-10 DIAGNOSIS — F341 Dysthymic disorder: Secondary | ICD-10-CM | POA: Diagnosis present

## 2014-06-10 DIAGNOSIS — R519 Headache, unspecified: Secondary | ICD-10-CM

## 2014-06-10 DIAGNOSIS — M353 Polymyalgia rheumatica: Secondary | ICD-10-CM | POA: Insufficient documentation

## 2014-06-10 DIAGNOSIS — R079 Chest pain, unspecified: Secondary | ICD-10-CM | POA: Diagnosis present

## 2014-06-10 DIAGNOSIS — M069 Rheumatoid arthritis, unspecified: Secondary | ICD-10-CM | POA: Insufficient documentation

## 2014-06-10 DIAGNOSIS — I10 Essential (primary) hypertension: Secondary | ICD-10-CM | POA: Diagnosis not present

## 2014-06-10 DIAGNOSIS — I6523 Occlusion and stenosis of bilateral carotid arteries: Secondary | ICD-10-CM | POA: Insufficient documentation

## 2014-06-10 DIAGNOSIS — Z8673 Personal history of transient ischemic attack (TIA), and cerebral infarction without residual deficits: Secondary | ICD-10-CM | POA: Insufficient documentation

## 2014-06-10 DIAGNOSIS — K219 Gastro-esophageal reflux disease without esophagitis: Secondary | ICD-10-CM | POA: Diagnosis not present

## 2014-06-10 DIAGNOSIS — Z9049 Acquired absence of other specified parts of digestive tract: Secondary | ICD-10-CM | POA: Diagnosis not present

## 2014-06-10 DIAGNOSIS — Z955 Presence of coronary angioplasty implant and graft: Secondary | ICD-10-CM | POA: Insufficient documentation

## 2014-06-10 DIAGNOSIS — Z8711 Personal history of peptic ulcer disease: Secondary | ICD-10-CM | POA: Diagnosis not present

## 2014-06-10 DIAGNOSIS — Z7951 Long term (current) use of inhaled steroids: Secondary | ICD-10-CM | POA: Diagnosis not present

## 2014-06-10 DIAGNOSIS — Z87891 Personal history of nicotine dependence: Secondary | ICD-10-CM | POA: Insufficient documentation

## 2014-06-10 DIAGNOSIS — Z87440 Personal history of urinary (tract) infections: Secondary | ICD-10-CM | POA: Insufficient documentation

## 2014-06-10 DIAGNOSIS — E538 Deficiency of other specified B group vitamins: Secondary | ICD-10-CM | POA: Insufficient documentation

## 2014-06-10 DIAGNOSIS — R51 Headache: Secondary | ICD-10-CM | POA: Diagnosis not present

## 2014-06-10 DIAGNOSIS — K589 Irritable bowel syndrome without diarrhea: Secondary | ICD-10-CM | POA: Insufficient documentation

## 2014-06-10 DIAGNOSIS — Z86010 Personal history of colonic polyps: Secondary | ICD-10-CM | POA: Insufficient documentation

## 2014-06-10 DIAGNOSIS — E785 Hyperlipidemia, unspecified: Secondary | ICD-10-CM | POA: Diagnosis not present

## 2014-06-10 DIAGNOSIS — I2510 Atherosclerotic heart disease of native coronary artery without angina pectoris: Secondary | ICD-10-CM | POA: Diagnosis not present

## 2014-06-10 DIAGNOSIS — F419 Anxiety disorder, unspecified: Secondary | ICD-10-CM | POA: Diagnosis not present

## 2014-06-10 DIAGNOSIS — Z7982 Long term (current) use of aspirin: Secondary | ICD-10-CM | POA: Insufficient documentation

## 2014-06-10 DIAGNOSIS — Z9841 Cataract extraction status, right eye: Secondary | ICD-10-CM | POA: Insufficient documentation

## 2014-06-10 DIAGNOSIS — Z881 Allergy status to other antibiotic agents status: Secondary | ICD-10-CM | POA: Insufficient documentation

## 2014-06-10 DIAGNOSIS — Z880 Allergy status to penicillin: Secondary | ICD-10-CM | POA: Diagnosis not present

## 2014-06-10 LAB — LIPID PANEL
Cholesterol: 181 mg/dL (ref 0–200)
HDL: 37 mg/dL — AB (ref >39)
LDL CALC: 130 mg/dL — AB (ref 0–99)
Total CHOL/HDL Ratio: 4.9 RATIO
Triglycerides: 70 mg/dL (ref <150)
VLDL: 14 mg/dL (ref 0–40)

## 2014-06-10 LAB — I-STAT TROPONIN, ED: TROPONIN I, POC: 0.01 ng/mL (ref 0.00–0.08)

## 2014-06-10 LAB — CBC
HCT: 43.2 % (ref 39.0–52.0)
Hemoglobin: 14.3 g/dL (ref 13.0–17.0)
MCH: 30.3 pg (ref 26.0–34.0)
MCHC: 33.1 g/dL (ref 30.0–36.0)
MCV: 91.5 fL (ref 78.0–100.0)
Platelets: 143 10*3/uL — ABNORMAL LOW (ref 150–400)
RBC: 4.72 MIL/uL (ref 4.22–5.81)
RDW: 13.3 % (ref 11.5–15.5)
WBC: 6 10*3/uL (ref 4.0–10.5)

## 2014-06-10 LAB — TROPONIN I: Troponin I: <0.03 ng/mL (ref <0.031)

## 2014-06-10 LAB — BASIC METABOLIC PANEL
Anion gap: 9 (ref 5–15)
BUN: 25 mg/dL — AB (ref 6–23)
CALCIUM: 9.6 mg/dL (ref 8.4–10.5)
CO2: 26 mmol/L (ref 19–32)
CREATININE: 1.3 mg/dL (ref 0.50–1.35)
Chloride: 106 mmol/L (ref 96–112)
GFR calc Af Amer: 54 mL/min — ABNORMAL LOW (ref >90)
GFR, EST NON AFRICAN AMERICAN: 47 mL/min — AB (ref >90)
GLUCOSE: 119 mg/dL — AB (ref 70–99)
POTASSIUM: 4 mmol/L (ref 3.5–5.1)
Sodium: 141 mmol/L (ref 135–145)

## 2014-06-10 LAB — SEDIMENTATION RATE: Sed Rate: 0 mm/hr (ref 0–16)

## 2014-06-10 MED ORDER — MORPHINE SULFATE 2 MG/ML IJ SOLN: 2.0000 mg | INTRAMUSCULAR | Status: DC | PRN

## 2014-06-10 MED ORDER — ONDANSETRON HCL 4 MG/2ML IJ SOLN: 4.0000 mg | Freq: Four times a day (QID) | INTRAMUSCULAR | Status: DC | PRN

## 2014-06-10 MED ORDER — ASPIRIN 81 MG PO CHEW
81.0000 mg | CHEWABLE_TABLET | Freq: Every day | ORAL | Status: DC
  Administered 2014-06-11: 81 mg via ORAL
  Filled 2014-06-10: qty 1

## 2014-06-10 MED ORDER — VITAMIN B-12 1000 MCG PO TABS
1000.0000 ug | ORAL_TABLET | Freq: Every day | ORAL | Status: DC
  Filled 2014-06-10: qty 1

## 2014-06-10 MED ORDER — VITAMIN D3 25 MCG (1000 UNIT) PO TABS
2000.0000 [IU] | ORAL_TABLET | Freq: Every day | ORAL | Status: DC
  Filled 2014-06-10: qty 2

## 2014-06-10 MED ORDER — METOPROLOL SUCCINATE ER 25 MG PO TB24
25.0000 mg | ORAL_TABLET | Freq: Every day | ORAL | Status: DC
Start: 2014-06-10 — End: 2014-06-11
  Administered 2014-06-10: 25 mg via ORAL
  Filled 2014-06-10: qty 1

## 2014-06-10 MED ORDER — PREDNISONE 2.5 MG PO TABS
2.5000 mg | ORAL_TABLET | Freq: Every day | ORAL | Status: DC
  Administered 2014-06-11: 2.5 mg via ORAL
  Filled 2014-06-10: qty 1

## 2014-06-10 MED ORDER — ESCITALOPRAM OXALATE 10 MG PO TABS
10.0000 mg | ORAL_TABLET | Freq: Every day | ORAL | Status: DC
  Filled 2014-06-10: qty 1

## 2014-06-10 MED ORDER — ACETAMINOPHEN 325 MG PO TABS: 650.0000 mg | ORAL_TABLET | ORAL | Status: DC | PRN

## 2014-06-10 MED ORDER — NITROGLYCERIN 0.4 MG SL SUBL: 0.4000 mg | SUBLINGUAL_TABLET | SUBLINGUAL | Status: DC | PRN

## 2014-06-10 MED ORDER — OCUVITE-LUTEIN PO CAPS
1.0000 | ORAL_CAPSULE | Freq: Two times a day (BID) | ORAL | Status: DC
  Administered 2014-06-10: 1 via ORAL
  Filled 2014-06-10 (×2): qty 1

## 2014-06-10 MED ORDER — GI COCKTAIL ~~LOC~~
30.0000 mL | Freq: Four times a day (QID) | ORAL | Status: DC | PRN
  Filled 2014-06-10: qty 30

## 2014-06-10 MED ORDER — PANTOPRAZOLE SODIUM 40 MG PO TBEC
40.0000 mg | DELAYED_RELEASE_TABLET | Freq: Every day | ORAL | Status: DC
  Filled 2014-06-10: qty 1

## 2014-06-10 MED ORDER — FLUTICASONE PROPIONATE 50 MCG/ACT NA SUSP
2.0000 | Freq: Every day | NASAL | Status: DC | PRN
  Filled 2014-06-10: qty 16

## 2014-06-10 NOTE — ED Notes (Signed)
While speaking with patient, c/o dizziness when tried to lay down in bed. Othrostatic vitals done. Pt c/o of "unsteady" while standing and frontal head pain.

## 2014-06-10 NOTE — Telephone Encounter (Signed)
Currently admitted.

## 2014-06-10 NOTE — ED Notes (Signed)
Bed: WA22 Expected date:  Expected time:  Means of arrival:  Comments: Hold for triage 2 

## 2014-06-10 NOTE — Telephone Encounter (Signed)
Error

## 2014-06-10 NOTE — Consult Note (Signed)
Patient ID: [name] MRN: [id number], DOB/AGE: 03-29-1923   Admit date: 06/10/2014  Reason for Consult: CP Referring MD: [facility]  Primary Physician: Nyoka Cowden, MD Primary Cardiologist: Dr. Percival Spanish  Pt. Profile:  81 y/o male with history of normal exercise tolerance test 06/20/13, GERD and anxiety presenting to [facility] ED with complaints of chest pain.   Problem List  Past Medical History  Diagnosis Date  . IBS (irritable bowel syndrome)   . Hemorrhoids   . Diverticulosis   . GERD (gastroesophageal reflux disease)   . Tubular adenoma of colon 07/1998  . Anxiety and depression   . Elevated prostate specific antigen (PSA)   . Vitamin B12 deficiency   . UTI (lower urinary tract infection)   . Arthritis   . Sciatica   . Anemia   . TIA (transient ischemic attack)   . Peptic ulcer disease   . Hyperlipidemia   . CAD (coronary artery disease)     PCI 2006, Stress Perfusion Study 2011  . Hypertension   . Rheumatoid arthritis(714.0)   . Polymyalgia   . Hiatal hernia     Past Surgical History  Procedure Laterality Date  . Ptca  2006    Stenting of proximal LAD w/ 75 % stenosis reduced to 0%  . Cataract extraction Bilateral   . Cholecystectomy  2001  . Hernia repair    . Hip fracture surgery  03/2007    Right  . Salivary stone removal  11/2007  . Radiology with anesthesia N/A 11/16/2012    Procedure: EMBOLIZATION  OF FISTULA;  Surgeon: Rob Hickman, MD;  Location: [place];  Service: Radiology;  Laterality: N/A;  . Dural fistula embolition with onyx  12/07/12     Allergies  Allergies  Allergen Reactions  . Amoxicillin Rash  . Penicillins Rash    HPI  The patient is an 81 y/o male, followed by Dr. Percival Spanish, [facility]. His PMH is significant for GERD on PPI therapy Prilosec, HLD as well as a h/o anxiety. He also has mild moderate carotid artery disease with less than 50% right and left internal carotid  stenosis. This is followed yearly by duplex ultrasound. He underwent CP eval 06/20/13 with an exercise tolerance test. The study showed no objective signs of ischemia. He was seen by Dr. Percival Spanish 11/15/2013 who suggested he follow-up with Dr. Fuller Plan for evaluation of nonanginal chest pain. It has also been felt that his anxiety may have contributed to his past chest pain episodes.   He reports he was in his usual state of health until this morning when he developed resting substernal chest pain. The pain was described as a dull ache. Non radiating. It was associated with a cold sweat/chills as well as a headache. No associated dyspnea, fever, n/v, palpitations, syncope/ near syncope. He states that he is always bothered with dizziness. The pain was very similar to his chest pain episodes in the past. No exacerbating or alleviating factors. Not worsen by exertion. The pain was constant and lasted roughly 2 hrs before resolving spontaneously. He reports full medication compliance with his PPI.    He is currently CP free. CT of head is negative. EKG shows NSR w/o ischemic abnormalities. CXR unremarkable. Initial troponin is negative. HR and BP both stable.     Home Medications  Prior to Admission medications   Medication Sig Start Date End Date Taking? Authorizing Provider  aspirin 81 MG  tablet Take 81 mg by mouth daily.   Yes Historical Provider, MD  Cholecalciferol (VITAMIN D PO) Take 2,000 mg by mouth daily.   Yes Historical Provider, MD  Coenzyme Q10 (CO Q-10) 100 MG CAPS Take 1 capsule by mouth daily.   Yes Historical Provider, MD  desonide (DESOWEN) 0.05 % ointment Apply 1 application topically 2 (two) times daily as needed (for skin).  04/05/14  Yes Historical Provider, MD  escitalopram (LEXAPRO) 10 MG tablet Take 1 tablet (10 mg total) by mouth daily. 04/29/14  Yes Marletta Lor, MD  fluticasone [facility]) 50 MCG/ACT nasal spray Place 2 sprays into both nostrils daily. Patient taking  differently: Place 2 sprays into both nostrils daily as needed for allergies or rhinitis.  01/08/14  Yes Marletta Lor, MD  metoprolol succinate (TOPROL-XL) 25 MG 24 hr tablet Take 1 tablet (25 mg total) by mouth daily. 02/12/14 02/12/15 Yes Minus Breeding, MD  multivitamin-lutein [facility]) CAPS capsule Take 1 capsule by mouth 2 (two) times daily.   Yes Historical Provider, MD  nitroGLYCERIN (NITROSTAT) 0.4 MG SL tablet Place 1 tablet (0.4 mg total) under the tongue every 5 (five) minutes as needed. Patient taking differently: Place 0.4 mg under the tongue every 5 (five) minutes as needed for chest pain.  10/31/12  Yes Minus Breeding, MD  omeprazole (PRILOSEC) 40 MG capsule Take 1 capsule (40 mg total) by mouth 2 (two) times daily. 03/25/14  Yes Ladene Artist, MD  predniSONE (DELTASONE) 5 MG tablet Take 0.5 tablets (2.5 mg total) by mouth daily with breakfast. 05/28/14  Yes Marletta Lor, MD  vitamin B-12 (CYANOCOBALAMIN) 1000 MCG tablet Take 1,000 mcg by mouth daily.   Yes Historical Provider, MD    Family History  Family History  Problem Relation Age of Onset  . Colon cancer Maternal Uncle 68    ish  . Coronary artery disease Father     Social History  History   Social History  . Marital Status: Married    Spouse Name: N/A  . Number of Children: 1  . Years of Education: N/A   Occupational History  . Retired English as a second language teacher    Social History Main Topics  . Smoking status: Former Smoker -- 0.25 packs/day for 20 years    Types: Cigarettes    Quit date: 03/30/1971  . Smokeless tobacco: Never Used  . Alcohol Use: No     Comment: occasional  . Drug Use: No  . Sexual Activity: Not Currently   Other Topics Concern  . Not on file   Social History Narrative     Review of Systems General:  No chills, fever, night sweats or weight changes.  Cardiovascular:  No chest pain, dyspnea on exertion, edema, orthopnea, palpitations, paroxysmal nocturnal  dyspnea. Dermatological: No rash, lesions/masses Respiratory: No cough, dyspnea Urologic: No hematuria, dysuria Abdominal:   No nausea, vomiting, diarrhea, bright red blood per rectum, melena, or hematemesis Neurologic:  No visual changes, wkns, changes in mental status. All other systems reviewed and are otherwise negative except as noted above.  Physical Exam  Blood pressure 138/65, pulse 72, temperature 98.1 F (36.7 C), temperature source Oral, resp. rate 14, SpO2 96 %.  General: Pleasant, NAD Psych: Normal affect. Neuro: Alert and oriented X 3. Moves all extremities spontaneously. HEENT: Normal  Neck: Supple. No JVD. + right sided bruit Lungs:  Resp regular and unlabored, CTA. Heart: RRR no s3, s4, or murmurs. Abdomen: Soft, non-tender, non-distended, BS + x 4.  Extremities: No  clubbing, cyanosis or edema. DP/PT/Radials 2+ and equal bilaterally.  Labs  Troponin [facility] of Care Test)  Recent Labs  06/10/14 1159  TROPIPOC 0.01   No results for input(s): CKTOTAL, CKMB, TROPONINI in the last 72 hours. Lab Results  Component Value Date   WBC 6.0 06/10/2014   HGB 14.3 06/10/2014   HCT 43.2 06/10/2014   MCV 91.5 06/10/2014   PLT 143* 06/10/2014    Recent Labs Lab 06/10/14 1154  NA 141  K 4.0  CL 106  CO2 26  BUN 25*  CREATININE 1.30  CALCIUM 9.6  GLUCOSE 119*   Lab Results  Component Value Date   CHOL 209* 06/20/2013   HDL 38.00* 06/20/2013   LDLCALC 147* 06/20/2013   TRIG 122.0 06/20/2013   Lab Results  Component Value Date   DDIMER 2.44* 11/07/2012     Radiology/Studies  Dg Chest 2 View  06/10/2014   CLINICAL DATA:  Chest pain  EXAM: CHEST  2 VIEW  COMPARISON:  12/15/2012  FINDINGS: Normal heart size and stable aortic contours. There is no edema, consolidation, effusion, or pneumothorax. There is chronic biapical pleural thickening which is stable from previous. No osseous findings to explain acute chest pain.  IMPRESSION: No active cardiopulmonary  disease.   Electronically Signed   By: Monte Fantasia M.D.   On: 06/10/2014 11:44   Ct Head Wo Contrast  06/10/2014   CLINICAL DATA:  Frontal headache for 4-5 hours. Chronic infusion. Prior TIA.  EXAM: CT HEAD WITHOUT CONTRAST  TECHNIQUE: Contiguous axial images were obtained from the base of the skull through the vertex without intravenous contrast.  COMPARISON:  Brain MRI 01/15/2014  FINDINGS: There is no evidence of acute cortical infarct, intracranial hemorrhage, mass, midline shift, or extra-axial fluid collection. Incidental note is made of a cavum septum pellucidum et vergae. There is mild to moderate generalized cerebral atrophy. Embolization material is noted at the posterior left skull base related to prior treatment of an AV fistula. Periventricular white-matter hypodensities are nonspecific but compatible with moderate chronic small vessel ischemic disease.  Prior bilateral cataract extraction is noted. Mastoid air cells and visualized paranasal sinuses are clear. Mild-to-moderate carotid siphon calcification is noted.  IMPRESSION: No evidence of acute intracranial abnormality.   Electronically Signed   By: Logan Bores   On: 06/10/2014 14:23    ECG NSR w/o ischemic abnormalities.      ASSESSMENT AND PLAN  1. Atypical Chest Pain: CP episode very atypical lasting ~2 hrs before resolving spontaneously. Not worse with exertion. EKG shows NSR w/o ischemic abnormalities. Initial troponin negative. Exam is fairly normal with the exception for right carotid bruit (known w/o mild-moderate diease on last doppler). He had a normal exercise tolerance test almost 1 year ago and his CP episode today was similar to his chest pain during that time. Doubt cardiac etiology however, would check a second troponin for reassurance. He may not require hospitalization if second troponin is negative. However, will defer final decision to MD.   Signed, Lyda Jester, PA-C 06/10/2014, 3:16 PM The patient  was seen in the emergency room.  His chest discomfort is less but he states there is still persistent mild left chest pressure.  His EKG was reviewed personally and shows no ischemic ST-T wave changes.  His initial troponin is normal.  His physical examination reveals clear lungs and his cardiac exam is unremarkable.  There is no chest wall tenderness.  The abdomen is nontender.  Extremities show no phlebitis or  edema. The patient states that what concerned him this time was that he became very diaphoretic with the chest discomfort.  His wife describes him as being very pale during the chest discomfort. I would favor getting further troponins overnight and if negative we will plan for a Lexiscan Myoview stress test tomorrow.  Orders for Lexiscan Myoview placed.

## 2014-06-10 NOTE — Telephone Encounter (Signed)
Noted  

## 2014-06-10 NOTE — H&P (Signed)
[facility] History and Physical  [name] MGN:[id number] DOB: 09/11/1922 DOA: 06/10/2014  Referring physician: Dr. Joseph Berkshire PCP: Nyoka Cowden, MD  Specialists: Dr. Percival Spanish, cardiology  Chief Complaint: Chest pain  HPI: [name] is a 81 y.o. male  With a history of coronary artery disease who follows with Dr.Hochrein, rheumatoid arthritis, GERD, presented to the emergency department with complaints of chest pain. Patient states his chest pain started approximately 8:30 this morning and lasted until coming to the emergency department. Per family at bedside, patient has been under a lot of stress and anxiety recently. Patient was seen by Dr. Percival Spanish August 2015 and suggested patient follow up with Dr. Fuller Plan for evaluation of non-anginal chest pain. Patient also had a exercise stress test approximately a year ago which was normal. Patient started having nonradiating chest pain at rest this morning and noted to be in the left side of his chest. Patient described it as a dull pain. Patient states that he had headache as well as sweating and chills with his pain. He denied any shortness of breath, dizziness, lightheadedness with his pain. Patient states this pain has been very reminiscent of his chest pain episodes in the past. Patient states nothing seemed to worsen his pain or achiness pain better. In the emergency department, chest x-ray was unremarkable, initial troponin was negative, EKG showed no changes. CT of the head was also taken which was negative for intracranial abnormalities. [facility] was asked to admit.  Review of Systems:  Constitutional: Denies fever, chills, diaphoresis, appetite change and fatigue.  HEENT: Denies photophobia, eye pain, redness, hearing loss, ear pain, congestion, sore throat, rhinorrhea, sneezing, mouth sores, trouble swallowing, neck pain, neck stiffness and tinnitus.   Respiratory: Denies SOB, DOE, cough, chest tightness,  and  wheezing.   Cardiovascular: Complains of chest pain Gastrointestinal: Denies nausea, vomiting, abdominal pain, diarrhea, constipation, blood in stool and abdominal distention.  Genitourinary: Denies dysuria, urgency, frequency, hematuria, flank pain and difficulty urinating.  Musculoskeletal: Denies myalgias, back pain, joint swelling, arthralgias and gait problem.  Skin: Denies pallor, rash and wound.  Neurological: Denies dizziness, seizures, syncope, weakness, light-headedness, numbness and headaches.  Hematological: Denies adenopathy. Easy bruising, personal or family bleeding history  Psychiatric/Behavioral: Denies suicidal ideation, mood changes, confusion, nervousness, sleep disturbance and agitation  Past Medical History  Diagnosis Date  . IBS (irritable bowel syndrome)   . Hemorrhoids   . Diverticulosis   . GERD (gastroesophageal reflux disease)   . Tubular adenoma of colon 07/1998  . Anxiety and depression   . Elevated prostate specific antigen (PSA)   . Vitamin B12 deficiency   . UTI (lower urinary tract infection)   . Arthritis   . Sciatica   . Anemia   . TIA (transient ischemic attack)   . Peptic ulcer disease   . Hyperlipidemia   . CAD (coronary artery disease)     PCI 2006, Stress Perfusion Study 2011  . Hypertension   . Rheumatoid arthritis(714.0)   . Polymyalgia   . Hiatal hernia    Past Surgical History  Procedure Laterality Date  . Ptca  2006    Stenting of proximal LAD w/ 75 % stenosis reduced to 0%  . Cataract extraction Bilateral   . Cholecystectomy  2001  . Hernia repair    . Hip fracture surgery  03/2007    Right  . Salivary stone removal  11/2007  . Radiology with anesthesia N/A 11/16/2012    Procedure: EMBOLIZATION  OF FISTULA;  Surgeon: Rob Hickman, MD;  Location: [place];  Service: Radiology;  Laterality: N/A;  . Dural fistula embolition with onyx  12/07/12   Social History:  reports that he quit smoking about 43 years ago. His smoking use  included Cigarettes. He has a 5 pack-year smoking history. He has never used smokeless tobacco. He reports that he does not drink alcohol or use illicit drugs.   Allergies  Allergen Reactions  . Amoxicillin Rash  . Penicillins Rash    Family History  Problem Relation Age of Onset  . Colon cancer Maternal Uncle 109    ish  . Coronary artery disease Father    Prior to Admission medications   Medication Sig Start Date End Date Taking? Authorizing Provider  aspirin 81 MG tablet Take 81 mg by mouth daily.   Yes Historical Provider, MD  Cholecalciferol (VITAMIN D PO) Take 2,000 mg by mouth daily.   Yes Historical Provider, MD  Coenzyme Q10 (CO Q-10) 100 MG CAPS Take 1 capsule by mouth daily.   Yes Historical Provider, MD  desonide (DESOWEN) 0.05 % ointment Apply 1 application topically 2 (two) times daily as needed (for skin).  04/05/14  Yes Historical Provider, MD  escitalopram (LEXAPRO) 10 MG tablet Take 1 tablet (10 mg total) by mouth daily. 04/29/14  Yes Marletta Lor, MD  fluticasone [facility]) 50 MCG/ACT nasal spray Place 2 sprays into both nostrils daily. Patient taking differently: Place 2 sprays into both nostrils daily as needed for allergies or rhinitis.  01/08/14  Yes Marletta Lor, MD  metoprolol succinate (TOPROL-XL) 25 MG 24 hr tablet Take 1 tablet (25 mg total) by mouth daily. 02/12/14 02/12/15 Yes Minus Breeding, MD  multivitamin-lutein [facility]) CAPS capsule Take 1 capsule by mouth 2 (two) times daily.   Yes Historical Provider, MD  nitroGLYCERIN (NITROSTAT) 0.4 MG SL tablet Place 1 tablet (0.4 mg total) under the tongue every 5 (five) minutes as needed. Patient taking differently: Place 0.4 mg under the tongue every 5 (five) minutes as needed for chest pain.  10/31/12  Yes Minus Breeding, MD  omeprazole (PRILOSEC) 40 MG capsule Take 1 capsule (40 mg total) by mouth 2 (two) times daily. 03/25/14  Yes Ladene Artist, MD  predniSONE (DELTASONE) 5 MG tablet Take 0.5  tablets (2.5 mg total) by mouth daily with breakfast. 05/28/14  Yes Marletta Lor, MD  vitamin B-12 (CYANOCOBALAMIN) 1000 MCG tablet Take 1,000 mcg by mouth daily.   Yes Historical Provider, MD   Physical Exam: Filed Vitals:   06/10/14 1506  BP: 138/65  Pulse: 72  Temp: 98.1 F (36.7 C)  Resp: 14     General: Well developed, well nourished, NAD, appears stated age  HEENT: NCAT, PERRLA, EOMI, Anicteic Sclera, mucous membranes moist.   Neck: Supple, no JVD, no masses, +bruit Right  Cardiovascular: S1 S2 auscultated, no rubs, murmurs or gallops. Regular rate and rhythm.  Respiratory: Clear to auscultation bilaterally with equal chest rise  Abdomen: Soft, nontender, nondistended, + bowel sounds  Extremities: warm dry without cyanosis clubbing or edema  Neuro: AAOx3, cranial nerves grossly intact. Strength equal and bilateral in upper/lower ext  Skin: Without rashes exudates or nodules  Psych: Normal affect and demeanor with intact judgement and insight  Labs on Admission:  Basic Metabolic Panel:  Recent Labs Lab 06/10/14 1154  NA 141  K 4.0  CL 106  CO2 26  GLUCOSE 119*  BUN 25*  CREATININE 1.30  CALCIUM 9.6   Liver  Function Tests: No results for input(s): AST, ALT, ALKPHOS, BILITOT, PROT, ALBUMIN in the last 168 hours. No results for input(s): LIPASE, AMYLASE in the last 168 hours. No results for input(s): AMMONIA in the last 168 hours. CBC:  Recent Labs Lab 06/10/14 1154  WBC 6.0  HGB 14.3  HCT 43.2  MCV 91.5  PLT 143*   Cardiac Enzymes: No results for input(s): CKTOTAL, CKMB, CKMBINDEX, TROPONINI in the last 168 hours.  BNP (last 3 results) No results for input(s): BNP in the last 8760 hours.  ProBNP (last 3 results) No results for input(s): PROBNP in the last 8760 hours.  CBG: No results for input(s): GLUCAP in the last 168 hours.  Radiological Exams on Admission: Dg Chest 2 View  06/10/2014   CLINICAL DATA:  Chest pain  EXAM: CHEST  2  VIEW  COMPARISON:  12/15/2012  FINDINGS: Normal heart size and stable aortic contours. There is no edema, consolidation, effusion, or pneumothorax. There is chronic biapical pleural thickening which is stable from previous. No osseous findings to explain acute chest pain.  IMPRESSION: No active cardiopulmonary disease.   Electronically Signed   By: Monte Fantasia M.D.   On: 06/10/2014 11:44   Ct Head Wo Contrast  06/10/2014   CLINICAL DATA:  Frontal headache for 4-5 hours. Chronic infusion. Prior TIA.  EXAM: CT HEAD WITHOUT CONTRAST  TECHNIQUE: Contiguous axial images were obtained from the base of the skull through the vertex without intravenous contrast.  COMPARISON:  Brain MRI 01/15/2014  FINDINGS: There is no evidence of acute cortical infarct, intracranial hemorrhage, mass, midline shift, or extra-axial fluid collection. Incidental note is made of a cavum septum pellucidum et vergae. There is mild to moderate generalized cerebral atrophy. Embolization material is noted at the posterior left skull base related to prior treatment of an AV fistula. Periventricular white-matter hypodensities are nonspecific but compatible with moderate chronic small vessel ischemic disease.  Prior bilateral cataract extraction is noted. Mastoid air cells and visualized paranasal sinuses are clear. Mild-to-moderate carotid siphon calcification is noted.  IMPRESSION: No evidence of acute intracranial abnormality.   Electronically Signed   By: Logan Bores   On: 06/10/2014 14:23    EKG: Independently reviewed. Sinus rhythm, rate 89  Assessment/Plan Active Problems:   Chest pain   Atypical chest pain   Atypical chest pain -Patient will be admitted for observation to telemetry -Patient does have history of coronary artery disease -Will monitor patient's troponin, first troponin negative, EKG shows no ischemic changes -Patient does have history of coronary artery disease -Had stress test approximately 1 year ago which  was normal at that time. -Cardiology consulted and appreciated -Will obtain a magnesium, phosphate, l TSH, ipid panel -Continue aspirin, metoprolol  Rheumatoid arthritis -Continue prednisone  Depression -Continue Lexapro  GERD -Continue PPI  DVT prophylaxis: SCDs  Code Status: Full  Condition: Guarded  Family Communication: Family at bedside. Admission, patients condition and plan of care including tests being ordered have been discussed with the patient and family who indicate understanding and agree with the plan and Code Status.  Disposition Plan: Admitted for observation   Time spent: 45 minutes  Nochum Fenter D.O. [facility] Pager [phone]  If 7PM-7AM, please contact night-coverage [email or web address] Password [facility] 06/10/2014, 4:29 PM

## 2014-06-10 NOTE — ED Notes (Signed)
Bed: WA02 Expected date:  Expected time:  Means of arrival:  Comments: 

## 2014-06-10 NOTE — Telephone Encounter (Signed)
Patient Name: [name]  DOB: [birth year]    Initial Comment caller states her husband is sweating , weak and has chest pain   Nurse Assessment  Nurse: Leilani Merl, RN, Heather Date/Time (Eastern Time): 06/10/2014 9:45:27 AM  Confirm and document reason for call. If symptomatic, describe symptoms. ---caller states her husband is sweating , weak and has chest pain. Caller states that this started days ago and is getting worse.  Has the patient traveled out of the country within the last 30 days? ---Not Applicable  Does the patient require triage? ---Yes  Related visit to physician within the last 2 weeks? ---No  Does the PT have any chronic conditions? (i.e. diabetes, asthma, etc.) ---Unknown     Guidelines    Guideline Title Affirmed Question Affirmed Notes  Chest Pain [1] Chest pain lasts > 5 minutes AND [2] age > 53    Final Disposition User   Call EMS 911 Now Standifer, RN, Conservator, museum/gallery states that she will take him to the ED, she will not call an ambulance.

## 2014-06-10 NOTE — ED Notes (Addendum)
Spoke to Fontanet about 20 min time of arrival...klj 16:50 can go to floor.

## 2014-06-10 NOTE — ED Provider Notes (Signed)
CSN: [id number]     Arrival date & time 06/10/14  1054 History   First MD Initiated Contact with Patient 06/10/14 1219     Chief Complaint  Patient presents with  . Chest Pain  . Dizziness     (Consider location/radiation/quality/duration/timing/severity/associated sxs/prior Treatment) HPI Comments: Patient presents to the ER for evaluation of multiple problems. Patient reports that he had chest pain associated with cold sweats earlier today. He does frequently have chest pain and requires nitroglycerin. He has been experiencing increased episodes of chest pain over the last 2 days. He has had some cough associated with the symptoms in the last couple of days. Patient reports that he did try nitroglycerin earlier and it did not help the chest pain. The pain, however, has spontaneously resolved.  Patient also reports dizziness, generalized weakness with intermittent headache. This is of concern because he has a history of dural fistula, status post embolization 2 years ago.  Patient is a 80 y.o. male presenting with chest pain.  Chest Pain Associated symptoms: diaphoresis, dizziness, fever, headache and weakness   Associated symptoms: no shortness of breath     Past Medical History  Diagnosis Date  . IBS (irritable bowel syndrome)   . Hemorrhoids   . Diverticulosis   . GERD (gastroesophageal reflux disease)   . Tubular adenoma of colon 07/1998  . Anxiety and depression   . Elevated prostate specific antigen (PSA)   . Vitamin B12 deficiency   . UTI (lower urinary tract infection)   . Arthritis   . Sciatica   . Anemia   . TIA (transient ischemic attack)   . Peptic ulcer disease   . Hyperlipidemia   . CAD (coronary artery disease)     PCI 2006, Stress Perfusion Study 2011  . Hypertension   . Rheumatoid arthritis(714.0)   . Polymyalgia   . Hiatal hernia    Past Surgical History  Procedure Laterality Date  . Ptca  2006    Stenting of proximal LAD w/ 75 % stenosis reduced to  0%  . Cataract extraction Bilateral   . Cholecystectomy  2001  . Hernia repair    . Hip fracture surgery  03/2007    Right  . Salivary stone removal  11/2007  . Radiology with anesthesia N/A 11/16/2012    Procedure: EMBOLIZATION  OF FISTULA;  Surgeon: Rob Hickman, MD;  Location: [facility];  Service: Radiology;  Laterality: N/A;  . Dural fistula embolition with onyx  12/07/12   Family History  Problem Relation Age of Onset  . Colon cancer Maternal Uncle 22    ish  . Coronary artery disease Father    History  Substance Use Topics  . Smoking status: Former Smoker -- 0.25 packs/day for 20 years    Types: Cigarettes    Quit date: 03/30/1971  . Smokeless tobacco: Never Used  . Alcohol Use: No     Comment: occasional    Review of Systems  Constitutional: Positive for fever and diaphoresis.  Respiratory: Negative for shortness of breath.   Cardiovascular: Positive for chest pain.  Neurological: Positive for dizziness, weakness and headaches.  All other systems reviewed and are negative.     Allergies  Amoxicillin and Penicillins  Home Medications   Prior to Admission medications   Medication Sig Start Date End Date Taking? Authorizing Provider  aspirin 81 MG tablet Take 81 mg by mouth daily.   Yes Historical Provider, MD  Cholecalciferol (VITAMIN D PO) Take 2,000 mg by mouth  daily.   Yes Historical Provider, MD  Coenzyme Q10 (CO Q-10) 100 MG CAPS Take 1 capsule by mouth daily.   Yes Historical Provider, MD  desonide (DESOWEN) 0.05 % ointment Apply 1 application topically 2 (two) times daily as needed (for skin).  04/05/14  Yes Historical Provider, MD  escitalopram (LEXAPRO) 10 MG tablet Take 1 tablet (10 mg total) by mouth daily. 04/29/14  Yes Marletta Lor, MD  fluticasone [facility]) 50 MCG/ACT nasal spray Place 2 sprays into both nostrils daily. Patient taking differently: Place 2 sprays into both nostrils daily as needed for allergies or rhinitis.  01/08/14  Yes Marletta Lor, MD  metoprolol succinate (TOPROL-XL) 25 MG 24 hr tablet Take 1 tablet (25 mg total) by mouth daily. 02/12/14 02/12/15 Yes Minus Breeding, MD  multivitamin-lutein [facility]) CAPS capsule Take 1 capsule by mouth 2 (two) times daily.   Yes Historical Provider, MD  nitroGLYCERIN (NITROSTAT) 0.4 MG SL tablet Place 1 tablet (0.4 mg total) under the tongue every 5 (five) minutes as needed. Patient taking differently: Place 0.4 mg under the tongue every 5 (five) minutes as needed for chest pain.  10/31/12  Yes Minus Breeding, MD  omeprazole (PRILOSEC) 40 MG capsule Take 1 capsule (40 mg total) by mouth 2 (two) times daily. 03/25/14  Yes Ladene Artist, MD  predniSONE (DELTASONE) 5 MG tablet Take 0.5 tablets (2.5 mg total) by mouth daily with breakfast. 05/28/14  Yes Marletta Lor, MD  vitamin B-12 (CYANOCOBALAMIN) 1000 MCG tablet Take 1,000 mcg by mouth daily.   Yes Historical Provider, MD   BP 143/62 mmHg  Pulse 91  Temp(Src) 97.9 F (36.6 C) (Oral)  Resp 18  SpO2 99% Physical Exam  Constitutional: He is oriented to person, place, and time. He appears well-developed and well-nourished. No distress.  HENT:  Head: Normocephalic and atraumatic.  Right Ear: Hearing normal.  Left Ear: Hearing normal.  Nose: Nose normal.  Mouth/Throat: Oropharynx is clear and moist and mucous membranes are normal.  Eyes: Conjunctivae and EOM are normal. Pupils are equal, round, and reactive to light.  Neck: Normal range of motion. Neck supple.  Cardiovascular: Regular rhythm, S1 normal and S2 normal.  Exam reveals no gallop and no friction rub.   No murmur heard. Pulmonary/Chest: Effort normal and breath sounds normal. No respiratory distress. He exhibits no tenderness.  Abdominal: Soft. Normal appearance and bowel sounds are normal. There is no hepatosplenomegaly. There is no tenderness. There is no rebound, no guarding, no tenderness at McBurney's point and negative Murphy's sign. No hernia.   Musculoskeletal: Normal range of motion.  Neurological: He is alert and oriented to person, place, and time. He has normal strength. No cranial nerve deficit or sensory deficit. Coordination normal. GCS eye subscore is 4. GCS verbal subscore is 5. GCS motor subscore is 6.  Skin: Skin is warm, dry and intact. No rash noted. No cyanosis.  Psychiatric: He has a normal mood and affect. His speech is normal and behavior is normal. Thought content normal.  Nursing note and vitals reviewed.   ED Course  Procedures (including critical care time) Labs Review Labs Reviewed  CBC - Abnormal; Notable for the following:    Platelets 143 (*)    All other components within normal limits  BASIC METABOLIC PANEL - Abnormal; Notable for the following:    Glucose, Bld 119 (*)    BUN 25 (*)    GFR calc non Af Amer 47 (*)    GFR  calc Af Amer 54 (*)    All other components within normal limits  SEDIMENTATION RATE  Randolm Idol, ED    Imaging Review Dg Chest 2 View  06/10/2014   CLINICAL DATA:  Chest pain  EXAM: CHEST  2 VIEW  COMPARISON:  12/15/2012  FINDINGS: Normal heart size and stable aortic contours. There is no edema, consolidation, effusion, or pneumothorax. There is chronic biapical pleural thickening which is stable from previous. No osseous findings to explain acute chest pain.  IMPRESSION: No active cardiopulmonary disease.   Electronically Signed   By: Monte Fantasia M.D.   On: 06/10/2014 11:44   Ct Head Wo Contrast  06/10/2014   CLINICAL DATA:  Frontal headache for 4-5 hours. Chronic infusion. Prior TIA.  EXAM: CT HEAD WITHOUT CONTRAST  TECHNIQUE: Contiguous axial images were obtained from the base of the skull through the vertex without intravenous contrast.  COMPARISON:  Brain MRI 01/15/2014  FINDINGS: There is no evidence of acute cortical infarct, intracranial hemorrhage, mass, midline shift, or extra-axial fluid collection. Incidental note is made of a cavum septum pellucidum et vergae.  There is mild to moderate generalized cerebral atrophy. Embolization material is noted at the posterior left skull base related to prior treatment of an AV fistula. Periventricular white-matter hypodensities are nonspecific but compatible with moderate chronic small vessel ischemic disease.  Prior bilateral cataract extraction is noted. Mastoid air cells and visualized paranasal sinuses are clear. Mild-to-moderate carotid siphon calcification is noted.  IMPRESSION: No evidence of acute intracranial abnormality.   Electronically Signed   By: Logan Bores   On: 06/10/2014 14:23     EKG Interpretation   Date/Time:  Monday June 10 2014 11:01:30 EDT Ventricular Rate:  89 PR Interval:  155 QRS Duration: 96 QT Interval:  378 QTC Calculation: 460 R Axis:   58 Text Interpretation:  Sinus rhythm Consider left atrial enlargement No  significant change since last tracing Confirmed by POLLINA  MD,  CHRISTOPHER [phone]) on 06/10/2014 2:52:13 PM      MDM   Final diagnoses:  Headache  Chest pain, unspecified chest pain type    Patient presents to the ER primarily because of chest pain. Patient does have a cardiac history with stenting. His cardiologist is Dr. Percival Spanish. He was referred to the ER by his doctor because of increased chest pain. He has been having increased occurrences of pain requiring nitroglycerin recently. He does tell me that the nitroglycerin usually does not help him. I did review his records, he was seen in the office approximately one year ago which time the plan was to obtain a stress test. It's not clear if that occurred.  Patient is not experiencing chest pain currently. EKG does not show any significant changes from previous. Troponin is negative.  Patient also complaining of dizziness and headache. This has been somewhat of an ongoing issue. He did have a dural fistula embolization performed 2 years ago. CT head was unremarkable today.  Patient complaining of generalized  fatigue, some muscle cramping. He does have a history of polymyalgia rheumatica. Sedimentation rate today, however, is 0.  Case discussed with cardiology. Dr. Mare Ferrari will consult on the patient, patient to be admitted by medicine.    Orpah Greek, MD 06/10/14 1459

## 2014-06-10 NOTE — ED Notes (Addendum)
Chest pain on and off x 2 days.  Respiratory cough.  Called MD and told to come here.  Told pt she could be having a heart attack.  Per wife, he is not eating, not sleeping, anxious and having weakness.

## 2014-06-11 ENCOUNTER — Ambulatory Visit (HOSPITAL_COMMUNITY)
Admit: 2014-06-11 | Discharge: 2014-06-11 | Disposition: A | Discharge status: Home / Self Care | Payer: Medicare Other | Attending: Cardiology | Admitting: Cardiology

## 2014-06-11 ENCOUNTER — Other Ambulatory Visit: Payer: Self-pay

## 2014-06-11 DIAGNOSIS — R0789 Other chest pain: Secondary | ICD-10-CM | POA: Diagnosis not present

## 2014-06-11 DIAGNOSIS — R079 Chest pain, unspecified: Secondary | ICD-10-CM

## 2014-06-11 DIAGNOSIS — M069 Rheumatoid arthritis, unspecified: Secondary | ICD-10-CM | POA: Diagnosis not present

## 2014-06-11 LAB — TSH: TSH: 2.584 u[IU]/mL (ref 0.350–4.500)

## 2014-06-11 LAB — MAGNESIUM: MAGNESIUM: 2.2 mg/dL (ref 1.5–2.5)

## 2014-06-11 LAB — TROPONIN I
Troponin I: <0.03 ng/mL (ref <0.031)
Troponin I: <0.03 ng/mL (ref <0.031)

## 2014-06-11 LAB — PHOSPHORUS: PHOSPHORUS: 2.6 mg/dL (ref 2.3–4.6)

## 2014-06-11 MED ORDER — TECHNETIUM TC 99M SESTAMIBI - CARDIOLITE
30.0000 | Freq: Once | INTRAVENOUS | Status: AC | PRN
  Administered 2014-06-11: 12:00:00 30 via INTRAVENOUS

## 2014-06-11 MED ORDER — REGADENOSON 0.4 MG/5ML IV SOLN
0.4000 mg | Freq: Once | INTRAVENOUS | Status: AC
  Administered 2014-06-11: 0.4 mg via INTRAVENOUS

## 2014-06-11 MED ORDER — ATORVASTATIN CALCIUM 20 MG PO TABS: 20.0000 mg | ORAL_TABLET | Freq: Every day | ORAL | Status: DC

## 2014-06-11 MED ORDER — TECHNETIUM TC 99M SESTAMIBI GENERIC - CARDIOLITE
10.0000 | Freq: Once | INTRAVENOUS | Status: AC | PRN
  Administered 2014-06-11: 10 via INTRAVENOUS

## 2014-06-11 MED ORDER — REGADENOSON 0.4 MG/5ML IV SOLN
INTRAVENOUS | Status: AC
Start: 2014-06-11 — End: 2014-06-11
  Filled 2014-06-11: qty 5

## 2014-06-11 NOTE — Discharge Summary (Signed)
Physician Discharge Summary  [name] GUR:[id number] DOB: 1922/04/04 DOA: 06/10/2014  PCP: Nyoka Cowden, MD  Admit date: 06/10/2014 Discharge date: 06/11/2014  Time spent: 35 minutes  Recommendations for Outpatient Follow-up:  Patient will be discharged home.  He is to follow up with his primary care physician within 1 week.  He should follow up with Dr. Percival Spanish, cardiology as needed.  Patient should continue his medications as prescribed.  He should follow a heart healthy diet.  He may resume activity as tolerated.   Discharge Diagnoses:  Atypical chest pain Rheumatoid arthritis Depression GERD Hyperlipidemia  Discharge Condition: Stable  Diet recommendation: heart healthy  Filed Weights   06/10/14 1724  Weight: 73.755 kg (162 lb 9.6 oz)    History of present illness:  On 2/83/1517 [name] is a 81 y.o. male with a history of coronary artery disease who follows with Dr.Hochrein, rheumatoid arthritis, GERD, presented to the emergency department with complaints of chest pain. Patient states his chest pain started approximately 8:30 this morning and lasted until coming to the emergency department. Per family at bedside, patient has been under a lot of stress and anxiety recently. Patient was seen by Dr. Percival Spanish August 2015 and suggested patient follow up with Dr. Fuller Plan for evaluation of non-anginal chest pain. Patient also had a exercise stress test approximately a year ago which was normal. Patient started having nonradiating chest pain at rest this morning and noted to be in the left side of his chest. Patient described it as a dull pain. Patient states that he had headache as well as sweating and chills with his pain. He denied any shortness of breath, dizziness, lightheadedness with his pain. Patient states this pain has been very reminiscent of his chest pain episodes in the past. Patient states nothing seemed to worsen his pain or achiness pain better. In the  emergency department, chest x-ray was unremarkable, initial troponin was negative, EKG showed no changes. CT of the head was also taken which was negative for intracranial abnormalities. [facility] was asked to admit.  Hospital Course:  Atypical chest pain -Patient does have history of coronary artery disease -Troponins cycled and negative, EKG showed no ischemic changes -Had stress test approximately 1 year ago which was normal at that time. -Cardiology consulted and appreciated and recommended lexiscan -Lexiscan was tolerated well; normal with no ischemia, EF 73% -TSH 2.584, Mg 2.2, Phos 2.6 -Lipid panel: TC 181, TG 70, HDL 37, LDL 130- will start patient on low dose lipitor -Continue aspirin, metoprolol  Rheumatoid arthritis -Continue prednisone  Depression -Continue Lexapro  GERD -Continue PPI  Hyperlipidemia -Lipid panel stated above -Start on low dose statin -Should be discussed with his PCP or cardiologist  Procedures: [place]  Consultations: Cardiology  Discharge Exam: Filed Vitals:   06/11/14 1224  BP: 139/63  Pulse: 82  Temp:   Resp: 20     General: Well developed, well nourished, NAD  HEENT: NCAT,mucous membranes moist.  Cardiovascular: S1 S2 auscultated, No murmurs, RRR  Respiratory: Clear to auscultation bilaterally with equal chest rise  Abdomen: Soft, nontender, nondistended, + bowel sounds  Extremities: warm dry without cyanosis clubbing or edema  Psych: Normal affect and demeanor  Discharge Instructions      Discharge Instructions    Discharge instructions    Complete by:  As directed   Patient will be discharged home.  He is to follow up with his primary care physician within 1 week.  He should follow up with Dr. Percival Spanish,  cardiology as needed.  Patient should continue his medications as prescribed.  He should follow a heart healthy diet.  He may resume activity as tolerated.            Medication List    TAKE these medications          aspirin 81 MG tablet  Take 81 mg by mouth daily.     atorvastatin 20 MG tablet  Commonly known as:  LIPITOR  Take 1 tablet (20 mg total) by mouth daily.     Co Q-10 100 MG Caps  Take 1 capsule by mouth daily.     desonide 0.05 % ointment  Commonly known as:  DESOWEN  Apply 1 application topically 2 (two) times daily as needed (for skin).     escitalopram 10 MG tablet  Commonly known as:  LEXAPRO  Take 1 tablet (10 mg total) by mouth daily.     fluticasone 50 MCG/ACT nasal spray  Commonly known as:  FLONASE  Place 2 sprays into both nostrils daily.     metoprolol succinate 25 MG 24 hr tablet  Commonly known as:  TOPROL-XL  Take 1 tablet (25 mg total) by mouth daily.     multivitamin-lutein Caps capsule  Take 1 capsule by mouth 2 (two) times daily.     nitroGLYCERIN 0.4 MG SL tablet  Commonly known as:  NITROSTAT  Place 1 tablet (0.4 mg total) under the tongue every 5 (five) minutes as needed.     omeprazole 40 MG capsule  Commonly known as:  PRILOSEC  Take 1 capsule (40 mg total) by mouth 2 (two) times daily.     predniSONE 5 MG tablet  Commonly known as:  DELTASONE  Take 0.5 tablets (2.5 mg total) by mouth daily with breakfast.     vitamin B-12 1000 MCG tablet  Commonly known as:  CYANOCOBALAMIN  Take 1,000 mcg by mouth daily.     VITAMIN D PO  Take 2,000 mg by mouth daily.       Allergies  Allergen Reactions  . Amoxicillin Rash  . Penicillins Rash   Follow-up Information    Follow up with Nyoka Cowden, MD. Schedule an appointment as soon as possible for a visit in 1 week.   Specialty:  Internal Medicine   Why:  Hospital follow-up   Contact information:   [place] [address] [phone]       Follow up with Minus Breeding, MD. Schedule an appointment as soon as possible for a visit in 3 weeks.   Specialty:  Cardiology   Why:  Hospital follow-up   Contact information:   [address] [place]  [place] [address] [phone]        The results of significant diagnostics from this hospitalization (including imaging, microbiology, ancillary and laboratory) are listed below for reference.    Significant Diagnostic Studies: Dg Chest 2 View  06/10/2014   CLINICAL DATA:  Chest pain  EXAM: CHEST  2 VIEW  COMPARISON:  12/15/2012  FINDINGS: Normal heart size and stable aortic contours. There is no edema, consolidation, effusion, or pneumothorax. There is chronic biapical pleural thickening which is stable from previous. No osseous findings to explain acute chest pain.  IMPRESSION: No active cardiopulmonary disease.   Electronically Signed   By: Monte Fantasia M.D.   On: 06/10/2014 11:44   Ct Head Wo Contrast  06/10/2014   CLINICAL DATA:  Frontal headache for 4-5 hours. Chronic infusion. Prior TIA.  EXAM: CT HEAD WITHOUT CONTRAST  TECHNIQUE: Contiguous axial images were obtained from the base of the skull through the vertex without intravenous contrast.  COMPARISON:  Brain MRI 01/15/2014  FINDINGS: There is no evidence of acute cortical infarct, intracranial hemorrhage, mass, midline shift, or extra-axial fluid collection. Incidental note is made of a cavum septum pellucidum et vergae. There is mild to moderate generalized cerebral atrophy. Embolization material is noted at the posterior left skull base related to prior treatment of an AV fistula. Periventricular white-matter hypodensities are nonspecific but compatible with moderate chronic small vessel ischemic disease.  Prior bilateral cataract extraction is noted. Mastoid air cells and visualized paranasal sinuses are clear. Mild-to-moderate carotid siphon calcification is noted.  IMPRESSION: No evidence of acute intracranial abnormality.   Electronically Signed   By: Logan Bores   On: 06/10/2014 14:23   Nm Myocar Multi W/spect W/wall Motion / Ef  06/11/2014   CLINICAL DATA:  Chest pain  EXAM: Lexiscan Myovue  TECHNIQUE: The patient received IV  Lexiscan .4mg  over 15 seconds. 33.0 mCi of Technetium 41m Sestamibi injected at 30 seconds. Quantitative SPECT images were obtained in the vertical, horizontal and short axis planes after a 45 minute delay. Rest images were obtained with similar planes and delay using 10.2 mCi of Technetium 60m Sestamibi.  FINDINGS: ECG:  NSR normal  Symptoms: None  RAW Data: Normal  QPS:  SDS 1  Quantitative Gated SPECT EF: 73%  Perfusion Images:  Normal Perfusion  IMPRESSION: Normal lexiscan myovue with no ischemia or infarct EF 73%  Jenkins Rouge   Electronically Signed   By: Jenkins Rouge M.D.   On: 06/11/2014 13:01    Microbiology: No results found for this or any previous visit (from the past 240 hour(s)).   Labs: Basic Metabolic Panel:  Recent Labs Lab 06/10/14 1154 06/11/14 0800  NA 141  --   K 4.0  --   CL 106  --   CO2 26  --   GLUCOSE 119*  --   BUN 25*  --   CREATININE 1.30  --   CALCIUM 9.6  --   MG  --  2.2  PHOS  --  2.6   Liver Function Tests: No results for input(s): AST, ALT, ALKPHOS, BILITOT, PROT, ALBUMIN in the last 168 hours. No results for input(s): LIPASE, AMYLASE in the last 168 hours. No results for input(s): AMMONIA in the last 168 hours. CBC:  Recent Labs Lab 06/10/14 1154  WBC 6.0  HGB 14.3  HCT 43.2  MCV 91.5  PLT 143*   Cardiac Enzymes:  Recent Labs Lab 06/10/14 1836 06/10/14 2300 06/11/14 0800  TROPONINI <0.03 <0.03 <0.03   BNP: BNP (last 3 results) No results for input(s): BNP in the last 8760 hours.  ProBNP (last 3 results) No results for input(s): PROBNP in the last 8760 hours.  CBG: No results for input(s): GLUCAP in the last 168 hours.     SignedCristal Ford  [facility] 06/11/2014, 2:12 PM

## 2014-06-11 NOTE — Discharge Instructions (Signed)
Stress Stress-related medical problems are becoming increasingly common. The body has a built-in physical response to stressful situations. Faced with pressure, challenge or danger, we need to react quickly. Our bodies release hormones such as cortisol and adrenaline to help do this. These hormones are part of the "fight or flight" response and affect the metabolic rate, heart rate and blood pressure, resulting in a heightened, stressed state that prepares the body for optimum performance in dealing with a stressful situation. It is likely that early man required these mechanisms to stay alive, but usually modern stresses do not call for this, and the same hormones released in today's world can damage health and reduce coping ability. CAUSES  Pressure to perform at work, at school or in sports.  Threats of physical violence.  Money worries.  Arguments.  Family conflicts.  Divorce or separation from significant other.  Bereavement.  New job or unemployment.  Changes in location.  Alcohol or drug abuse. SOMETIMES, THERE IS NO PARTICULAR REASON FOR DEVELOPING STRESS. Almost all people are at risk of being stressed at some time in their lives. It is important to know that some stress is temporary and some is long term.  Temporary stress will go away when a situation is resolved. Most people can cope with short periods of stress, and it can often be relieved by relaxing, taking a walk or getting any type of exercise, chatting through issues with friends, or having a good night's sleep.  Chronic (long-term, continuous) stress is much harder to deal with. It can be psychologically and emotionally damaging. It can be harmful both for an individual and for friends and family. SYMPTOMS Everyone reacts to stress differently. There are some common effects that help [place] recognize it. In times of extreme stress, people may:  Shake uncontrollably.  Breathe faster and deeper than normal  (hyperventilate).  Vomit.  For people with asthma, stress can trigger an attack.  For some people, stress may trigger migraine headaches, ulcers, and body pain. PHYSICAL EFFECTS OF STRESS MAY INCLUDE:  Loss of energy.  Skin problems.  Aches and pains resulting from tense muscles, including neck ache, backache and tension headaches.  Increased pain from arthritis and other conditions.  Irregular heart beat (palpitations).  Periods of irritability or anger.  Apathy or depression.  Anxiety (feeling uptight or worrying).  Unusual behavior.  Loss of appetite.  Comfort eating.  Lack of concentration.  Loss of, or decreased, sex-drive.  Increased smoking, drinking, or recreational drug use.  For women, missed periods.  Ulcers, joint pain, and muscle pain. Post-traumatic stress is the stress caused by any serious accident, strong emotional damage, or extremely difficult or violent experience such as rape or war. Post-traumatic stress victims can experience mixtures of emotions such as fear, shame, depression, guilt or anger. It may include recurrent memories or images that may be haunting. These feelings can last for weeks, months or even years after the traumatic event that triggered them. Specialized treatment, possibly with medicines and psychological therapies, is available. If stress is causing physical symptoms, severe distress or making it difficult for you to function as normal, it is worth seeing your caregiver. It is important to remember that although stress is a usual part of life, extreme or prolonged stress can lead to other illnesses that will need treatment. It is better to visit a doctor sooner rather than later. Stress has been linked to the development of high blood pressure and heart disease, as well as insomnia and depression.  There is no diagnostic test for stress since everyone reacts to it differently. But a caregiver will be able to spot the physical  symptoms, such as:  Headaches.  Shingles.  Ulcers. Emotional distress such as intense worry, low mood or irritability should be detected when the doctor asks pertinent questions to identify any underlying problems that might be the cause. In case there are physical reasons for the symptoms, the doctor may also want to do some tests to exclude certain conditions. If you feel that you are suffering from stress, try to identify the aspects of your life that are causing it. Sometimes you may not be able to change or avoid them, but even a small change can have a positive ripple effect. A simple lifestyle change can make all the difference. STRATEGIES THAT CAN HELP DEAL WITH STRESS:  Delegating or sharing responsibilities.  Avoiding confrontations.  Learning to be more assertive.  Regular exercise.  Avoid using alcohol or street drugs to cope.  Eating a healthy, balanced diet, rich in fruit and vegetables and proteins.  Finding humor or absurdity in stressful situations.  Never taking on more than you know you can handle comfortably.  Organizing your time better to get as much done as possible.  Talking to friends or family and sharing your thoughts and fears.  Listening to music or relaxation tapes.  Relaxation techniques like deep breathing, meditation, and yoga.  Tensing and then relaxing your muscles, starting at the toes and working up to the head and neck. If you think that you would benefit from help, either in identifying the things that are causing your stress or in learning techniques to help you relax, see a caregiver who is capable of helping you with this. Rather than relying on medications, it is usually better to try and identify the things in your life that are causing stress and try to deal with them. There are many techniques of managing stress including counseling, psychotherapy, aromatherapy, yoga, and exercise. Your caregiver can help you determine what is best  for you. Document Released: 06/05/2002 Document Revised: 03/20/2013 Document Reviewed: 05/02/2007 [facility] Patient Information 2015 [place], [place]. This information is not intended to replace advice given to you by your health care provider. Make sure you discuss any questions you have with your health care provider. Chest Pain (Nonspecific) It is often hard to give a specific diagnosis for the cause of chest pain. There is always a chance that your pain could be related to something serious, such as a heart attack or a blood clot in the lungs. You need to follow up with your health care provider for further evaluation. CAUSES   Heartburn.  Pneumonia or bronchitis.  Anxiety or stress.  Inflammation around your heart (pericarditis) or lung (pleuritis or pleurisy).  A blood clot in the lung.  A collapsed lung (pneumothorax). It can develop suddenly on its own (spontaneous pneumothorax) or from trauma to the chest.  Shingles infection (herpes zoster virus). The chest wall is composed of bones, muscles, and cartilage. Any of these can be the source of the pain.  The bones can be bruised by injury.  The muscles or cartilage can be strained by coughing or overwork.  The cartilage can be affected by inflammation and become sore (costochondritis). DIAGNOSIS  Lab tests or other studies may be needed to find the cause of your pain. Your health care provider may have you take a test called an ambulatory electrocardiogram (ECG). An ECG records your heartbeat patterns  over a 24-hour period. You may also have other tests, such as:  Transthoracic echocardiogram (TTE). During echocardiography, sound waves are used to evaluate how blood flows through your heart.  Transesophageal echocardiogram (TEE).  Cardiac monitoring. This allows your health care provider to monitor your heart rate and rhythm in real time.  Holter monitor. This is a portable device that records your heartbeat and can help  diagnose heart arrhythmias. It allows your health care provider to track your heart activity for several days, if needed.  Stress tests by exercise or by giving medicine that makes the heart beat faster. TREATMENT   Treatment depends on what may be causing your chest pain. Treatment may include:  Acid blockers for heartburn.  Anti-inflammatory medicine.  Pain medicine for inflammatory conditions.  Antibiotics if an infection is present.  You may be advised to change lifestyle habits. This includes stopping smoking and avoiding alcohol, caffeine, and chocolate.  You may be advised to keep your head raised (elevated) when sleeping. This reduces the chance of acid going backward from your stomach into your esophagus. Most of the time, nonspecific chest pain will improve within 2-3 days with rest and mild pain medicine.  HOME CARE INSTRUCTIONS   If antibiotics were prescribed, take them as directed. Finish them even if you start to feel better.  For the next few days, avoid physical activities that bring on chest pain. Continue physical activities as directed.  Do not use any tobacco products, including cigarettes, chewing tobacco, or electronic cigarettes.  Avoid drinking alcohol.  Only take medicine as directed by your health care provider.  Follow your health care provider's suggestions for further testing if your chest pain does not go away.  Keep any follow-up appointments you made. If you do not go to an appointment, you could develop lasting (chronic) problems with pain. If there is any problem keeping an appointment, call to reschedule. SEEK MEDICAL CARE IF:   Your chest pain does not go away, even after treatment.  You have a rash with blisters on your chest.  You have a fever. SEEK IMMEDIATE MEDICAL CARE IF:   You have increased chest pain or pain that spreads to your arm, neck, jaw, back, or abdomen.  You have shortness of breath.  You have an increasing cough,  or you cough up blood.  You have severe back or abdominal pain.  You feel nauseous or vomit.  You have severe weakness.  You faint.  You have chills. This is an emergency. Do not wait to see if the pain will go away. Get medical help at once. Call your local emergency services (911 in [place] Do not drive yourself to the hospital. MAKE SURE YOU:   Understand these instructions.  Will watch your condition.  Will get help right away if you are not doing well or get worse. Document Released: 12/23/2004 Document Revised: 03/20/2013 Document Reviewed: 10/19/2007 [facility] Patient Information 2015 [place], [place]. This information is not intended to replace advice given to you by your health care provider. Make sure you discuss any questions you have with your health care provider.

## 2014-06-11 NOTE — Progress Notes (Signed)
SUBJECTIVE:  No further CP  OBJECTIVE:   Vitals:   Filed Vitals:   06/10/14 1639 06/10/14 1724 06/10/14 2246 06/11/14 0445  BP: 138/73 147/68 140/68 132/65  Pulse: 72 71 61 67  Temp: 97.5 F (36.4 C) 98.3 F (36.8 C) 97.8 F (36.6 C) 97.7 F (36.5 C)  TempSrc: Oral Oral Oral Oral  Resp: 14 16 16 18   Height:  5\' 9"  (1.753 m)    Weight:  162 lb 9.6 oz (73.755 kg)    SpO2: 98% 100% 98% 98%   I&O's:   Intake/Output Summary (Last 24 hours) at 06/11/14 1287 Last data filed at 06/11/14 0447  Gross per 24 hour  Intake      0 ml  Output    250 ml  Net   -250 ml   TELEMETRY: Reviewed telemetry pt in NSR:     PHYSICAL EXAM General: Well developed, well nourished, in no acute distress Head: Eyes PERRLA, No xanthomas.   Normal cephalic and atramatic  Lungs:   Clear bilaterally to auscultation and percussion. Heart:   HRRR S1 S2 Pulses are 2+ & equal. Abdomen: Bowel sounds are positive, abdomen soft and non-tender without masses  Extremities:   No clubbing, cyanosis or edema.  DP +1 Neuro: Alert and oriented X 3. Psych:  Good affect, responds appropriately   LABS: Basic Metabolic Panel:  Recent Labs  06/10/14 1154  NA 141  K 4.0  CL 106  CO2 26  GLUCOSE 119*  BUN 25*  CREATININE 1.30  CALCIUM 9.6   Liver Function Tests: No results for input(s): AST, ALT, ALKPHOS, BILITOT, PROT, ALBUMIN in the last 72 hours. No results for input(s): LIPASE, AMYLASE in the last 72 hours. CBC:  Recent Labs  06/10/14 1154  WBC 6.0  HGB 14.3  HCT 43.2  MCV 91.5  PLT 143*   Cardiac Enzymes:  Recent Labs  06/10/14 1836 06/10/14 2300  TROPONINI <0.03 <0.03   BNP: Invalid input(s): POCBNP D-Dimer: No results for input(s): DDIMER in the last 72 hours. Hemoglobin A1C: No results for input(s): HGBA1C in the last 72 hours. Fasting Lipid Panel:  Recent Labs  06/10/14 1836  CHOL 181  HDL 37*  LDLCALC 130*  TRIG 70  CHOLHDL 4.9   Thyroid Function Tests: No  results for input(s): TSH, T4TOTAL, T3FREE, THYROIDAB in the last 72 hours.  Invalid input(s): FREET3 Anemia Panel: No results for input(s): VITAMINB12, FOLATE, FERRITIN, TIBC, IRON, RETICCTPCT in the last 72 hours. Coag Panel:   Lab Results  Component Value Date   INR 1.02 11/15/2012   INR 1.05 11/10/2012   INR 1.5 04/29/2007    RADIOLOGY: Dg Chest 2 View  06/10/2014   CLINICAL DATA:  Chest pain  EXAM: CHEST  2 VIEW  COMPARISON:  12/15/2012  FINDINGS: Normal heart size and stable aortic contours. There is no edema, consolidation, effusion, or pneumothorax. There is chronic biapical pleural thickening which is stable from previous. No osseous findings to explain acute chest pain.  IMPRESSION: No active cardiopulmonary disease.   Electronically Signed   By: Monte Fantasia M.D.   On: 06/10/2014 11:44   Ct Head Wo Contrast  06/10/2014   CLINICAL DATA:  Frontal headache for 4-5 hours. Chronic infusion. Prior TIA.  EXAM: CT HEAD WITHOUT CONTRAST  TECHNIQUE: Contiguous axial images were obtained from the base of the skull through the vertex without intravenous contrast.  COMPARISON:  Brain MRI 01/15/2014  FINDINGS: There is no evidence of acute cortical infarct,  intracranial hemorrhage, mass, midline shift, or extra-axial fluid collection. Incidental note is made of a cavum septum pellucidum et vergae. There is mild to moderate generalized cerebral atrophy. Embolization material is noted at the posterior left skull base related to prior treatment of an AV fistula. Periventricular white-matter hypodensities are nonspecific but compatible with moderate chronic small vessel ischemic disease.  Prior bilateral cataract extraction is noted. Mastoid air cells and visualized paranasal sinuses are clear. Mild-to-moderate carotid siphon calcification is noted.  IMPRESSION: No evidence of acute intracranial abnormality.   Electronically Signed   By: Logan Bores   On: 06/10/2014 14:23    ASSESSMENT AND  PLAN  1. Atypical Chest Pain: CP episode very atypical lasting ~2 hrs before resolving spontaneously. Not worse with exertion. EKG shows NSR w/o ischemic abnormalities. Initial troponin negative.  He had a normal exercise tolerance test almost 1 year ago and his CP episode this admission was similar to his chest pain during that time. Doubt cardiac etiology and cardiac enzymes negative.  Plan Lexiscan myoview today.  Sueanne Margarita, MD  06/11/2014  8:12 AM

## 2014-06-11 NOTE — Progress Notes (Signed)
UR completed 

## 2014-06-11 NOTE — Care Management Note (Addendum)
    Page 1 of 1   06/11/2014     2:30:15 PM CARE MANAGEMENT NOTE 07/06/7351  Patient:  [name], [name]   Account Number:  [id number]  Date Initiated:  06/11/2014  Documentation initiated by:  Dessa Phi  Subjective/Objective Assessment:   81 y/o m admitted w/atypical chest pain.     Action/Plan:   From home.   Anticipated DC Date:  06/11/2014   Anticipated DC Plan:  [place]  CM consult      Choice offered to / List presented to:             Status of service:  Completed, signed off Medicare Important Message given?   (If response is "NO", the following Medicare IM given date fields will be blank) Date Medicare IM given:   Medicare IM given by:   Date Additional Medicare IM given:   Additional Medicare IM given by:    Discharge Disposition:  HOME/SELF CARE  Per UR Regulation:  Reviewed for med. necessity/level of care/duration of stay  If discussed at [place] of Stay Meetings, dates discussed:    Comments:  06/11/14 Dessa Phi RN BSN NCM [id number] d/c order. s/p [facility] for stress test.No anticipated d/c needs.

## 2014-06-11 NOTE — Progress Notes (Signed)
The patient tolerated the [place] well.  Some SOB.  O2 sats 100%.  Interpretation to follow.  Conor Filsaime, PA-C

## 2014-06-19 ENCOUNTER — Encounter: Payer: Self-pay | Admitting: Internal Medicine

## 2014-06-19 ENCOUNTER — Ambulatory Visit (INDEPENDENT_AMBULATORY_CARE_PROVIDER_SITE_OTHER): Payer: Medicare Other | Admitting: Internal Medicine

## 2014-06-19 VITALS — BP 110/66 | HR 78 | Temp 98.0°F | Resp 18 | Ht 69.0 in | Wt 167.0 lb

## 2014-06-19 DIAGNOSIS — M353 Polymyalgia rheumatica: Secondary | ICD-10-CM | POA: Diagnosis not present

## 2014-06-19 DIAGNOSIS — R079 Chest pain, unspecified: Secondary | ICD-10-CM

## 2014-06-19 DIAGNOSIS — M069 Rheumatoid arthritis, unspecified: Secondary | ICD-10-CM | POA: Diagnosis not present

## 2014-06-19 MED ORDER — AXONA PO PACK: 40.0000 g | PACK | Freq: Every day | ORAL | Status: AC

## 2014-06-19 MED ORDER — ALPRAZOLAM 0.25 MG PO TABS: 0.2500 mg | ORAL_TABLET | Freq: Two times a day (BID) | ORAL | Status: DC | PRN

## 2014-06-19 NOTE — Patient Instructions (Signed)
Call or return to clinic prn if these symptoms worsen or fail to improve as anticipated.

## 2014-06-19 NOTE — Progress Notes (Signed)
Pre visit review using our clinic review tool, if applicable. No additional management support is needed unless otherwise documented below in the visit note. 

## 2014-06-20 ENCOUNTER — Encounter: Payer: Self-pay | Admitting: Internal Medicine

## 2014-06-20 NOTE — Progress Notes (Signed)
Subjective:    Patient ID: [name], male    DOB: [birth year], 81 y.o.   MRN: [id number]  HPI 81 year old patient who is seen today following a recent hospital discharge.  He was admitted for chest pain.  Subsequent nuclear study revealed no evidence of ischemia.  Since his discharge, he has had no recurrent chest pain.  He is scheduled to see cardiology soon. He has multiple somatic complaints including dizziness, fatigue, unsteady gait, weakness and memory loss.  He does have a history of anxiety, depression.  Remains on Lexapro. A diagnosis of rheumatoid arthritis has been added to his problem list.  He does have a history of polymyalgia rheumatica and remains on low-dose prednisone He will be relocating to [place] in the very near future  Hospital records reviewed  Admit date: 06/10/2014 Discharge date: 06/11/2014  Recommendations for Outpatient Follow-up:  Patient will be discharged home. He is to follow up with his primary care physician within 1 week. He should follow up with Dr. Percival Spanish, cardiology as needed. Patient should continue his medications as prescribed. He should follow a heart healthy diet. He may resume activity as tolerated.   Discharge Diagnoses:  Atypical chest pain Rheumatoid arthritis Depression GERD Hyperlipidemia  Discharge Condition: Stable  Diet recommendation: heart healthy  Past Medical History  Diagnosis Date  . IBS (irritable bowel syndrome)   . Hemorrhoids   . Diverticulosis   . GERD (gastroesophageal reflux disease)   . Tubular adenoma of colon 07/1998  . Anxiety and depression   . Elevated prostate specific antigen (PSA)   . Vitamin B12 deficiency   . UTI (lower urinary tract infection)   . Arthritis   . Sciatica   . Anemia   . TIA (transient ischemic attack)   . Peptic ulcer disease   . Hyperlipidemia   . CAD (coronary artery disease)     PCI 2006, Stress Perfusion Study 2011  . Hypertension   . Rheumatoid  arthritis(714.0)   . Polymyalgia   . Hiatal hernia     History   Social History  . Marital Status: Married    Spouse Name: N/A  . Number of Children: 1  . Years of Education: N/A   Occupational History  . Retired English as a second language teacher    Social History Main Topics  . Smoking status: Former Smoker -- 0.25 packs/day for 20 years    Types: Cigarettes    Quit date: 03/30/1971  . Smokeless tobacco: Never Used  . Alcohol Use: No     Comment: occasional  . Drug Use: No  . Sexual Activity: Not Currently   Other Topics Concern  . Not on file   Social History Narrative    Past Surgical History  Procedure Laterality Date  . Ptca  2006    Stenting of proximal LAD w/ 75 % stenosis reduced to 0%  . Cataract extraction Bilateral   . Cholecystectomy  2001  . Hernia repair    . Hip fracture surgery  03/2007    Right  . Salivary stone removal  11/2007  . Radiology with anesthesia N/A 11/16/2012    Procedure: EMBOLIZATION  OF FISTULA;  Surgeon: Rob Hickman, MD;  Location: [place];  Service: Radiology;  Laterality: N/A;  . 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 Colon cancer Maternal Uncle 36    ish  . Coronary artery disease Father     Allergies  Allergen  Reactions  . Amoxicillin Rash  . Penicillins Rash    Current Outpatient Prescriptions on File Prior to Visit  Medication Sig Dispense Refill  . aspirin 81 MG tablet Take 81 mg by mouth daily.    . Cholecalciferol (VITAMIN D PO) Take 2,000 mg by mouth daily.    . Coenzyme Q10 (CO Q-10) 100 MG CAPS Take 1 capsule by mouth daily.    Marland Kitchen desonide (DESOWEN) 0.05 % ointment Apply 1 application topically 2 (two) times daily as needed (for skin).     Marland Kitchen escitalopram (LEXAPRO) 10 MG tablet Take 1 tablet (10 mg total) by mouth daily. 90 tablet 3  . fluticasone (FLONASE) 50 MCG/ACT nasal spray Place 2 sprays into both nostrils daily. (Patient taking differently: Place 2 sprays into both  nostrils daily as needed for allergies or rhinitis. ) 15.8 g 6  . metoprolol succinate (TOPROL-XL) 25 MG 24 hr tablet Take 1 tablet (25 mg total) by mouth daily. 90 tablet 1  . multivitamin-lutein (OCUVITE-LUTEIN) CAPS capsule Take 1 capsule by mouth 2 (two) times daily.    . nitroGLYCERIN (NITROSTAT) 0.4 MG SL tablet Place 1 tablet (0.4 mg total) under the tongue every 5 (five) minutes as needed. (Patient taking differently: Place 0.4 mg under the tongue every 5 (five) minutes as needed for chest pain. ) 25 tablet 6  . omeprazole (PRILOSEC) 40 MG capsule Take 1 capsule (40 mg total) by mouth 2 (two) times daily. 60 capsule 11  . predniSONE (DELTASONE) 5 MG tablet Take 0.5 tablets (2.5 mg total) by mouth daily with breakfast.    . vitamin B-12 (CYANOCOBALAMIN) 1000 MCG tablet Take 1,000 mcg by mouth daily.     No current facility-administered medications on file prior to visit.    BP 110/66 mmHg  Pulse 78  Temp(Src) 98 F (36.7 C) (Oral)  Resp 18  Ht 5\' 9"  (1.753 m)  Wt 167 lb (75.751 kg)  BMI 24.65 kg/m2  SpO2 98%    Review of Systems  Constitutional: Positive for fatigue. Negative for fever, chills and appetite change.  HENT: Negative for congestion, dental problem, ear pain, hearing loss, sore throat, tinnitus, trouble swallowing and voice change.   Eyes: Negative for pain, discharge and visual disturbance.  Respiratory: Negative for cough, chest tightness, wheezing and stridor.   Cardiovascular: Positive for chest pain. Negative for palpitations and leg swelling.  Gastrointestinal: Negative for nausea, vomiting, abdominal pain, diarrhea, constipation, blood in stool and abdominal distention.  Genitourinary: Negative for urgency, hematuria, flank pain, discharge, difficulty urinating and genital sores.  Musculoskeletal: Negative for myalgias, back pain, joint swelling, arthralgias, gait problem and neck stiffness.  Skin: Negative for rash.  Neurological: Positive for dizziness,  weakness and light-headedness. Negative for syncope, speech difficulty, numbness and headaches.  Hematological: Negative for adenopathy. Does not bruise/bleed easily.  Psychiatric/Behavioral: Positive for dysphoric mood. Negative for behavioral problems. The patient is nervous/anxious.        Objective:   Physical Exam  Constitutional: He is oriented to person, place, and time. He appears well-developed and well-nourished. No distress.  HENT:  Head: Normocephalic.  Right Ear: External ear normal.  Left Ear: External ear normal.  Eyes: Conjunctivae and EOM are normal.  Neck: Normal range of motion.  Cardiovascular: Normal rate and normal heart sounds.   Pulmonary/Chest: Breath sounds normal.  Abdominal: Bowel sounds are normal.  Musculoskeletal: Normal range of motion. He exhibits no edema or tenderness.  Neurological: He is alert and oriented to person, place, and  time.  Psychiatric: He has a normal mood and affect. His behavior is normal.  Alert and appropriate, anxious          Assessment & Plan:   Anxiety, depression.  We'll continue on Lexapro.  The patient was given a prescription for Xanax to assist with sleep if needed with inter continental travel Chest pain syndrome.  Noncardiac stable Polymyalgia rheumatica.  Continue prednisone 5 mg daily Coronary artery disease  Return here when necessary Patient will be relocating to [place] in the near future Follow-up cardiology if needed

## 2014-06-26 ENCOUNTER — Telehealth: Payer: Self-pay | Admitting: Internal Medicine

## 2014-06-26 NOTE — Telephone Encounter (Signed)
PA request was received.  I called [employer] to submit PA and was advised the medication is a PLAN EXCLUSION.  Patient must pay out of pocket if they want the medication.

## 2014-06-26 NOTE — Telephone Encounter (Signed)
Spoke to pt, told him we tried to do PA on medication Axona and the insurance company denied it due to plan exclusion and if you want the medication will have to pay out of pocket. Pt verbalized understanding and asked how much would it be. Told pt I do not know would have to ask the pharmacist. Pt verbalized understanding.

## 2014-06-27 IMAGING — RF DG ESOPHAGUS
9 of 11 series · 19 of 24 positions shown · non-contrast
Comparison: CT urogram of 05/12/2010

CLINICAL DATA: Symptoms of reflux

ESOPHOGRAM/BARIUM SWALLOW
TECHNIQUE: Single contrast examination was performed using thin
barium.
Fluoroscopy time:  1.6 minutes.

[Series 1: run · 6 of 12 slices shown (1 of 9)]
[im 1/12]
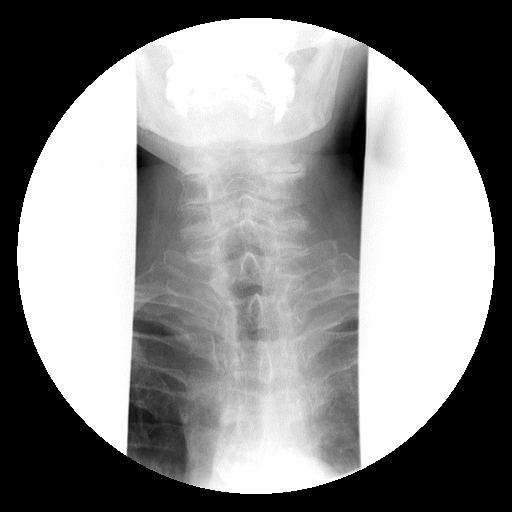
[im 2/12]
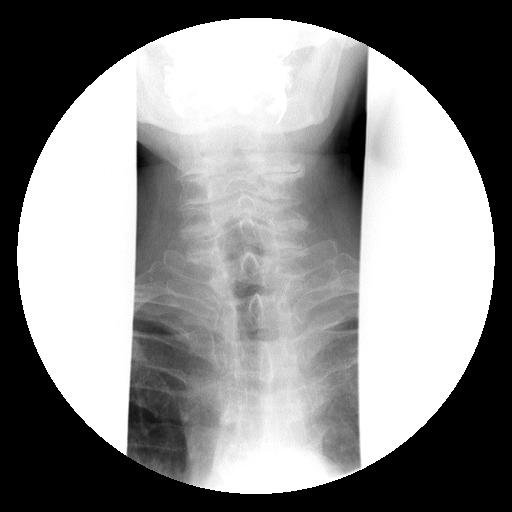
[im 5/12]
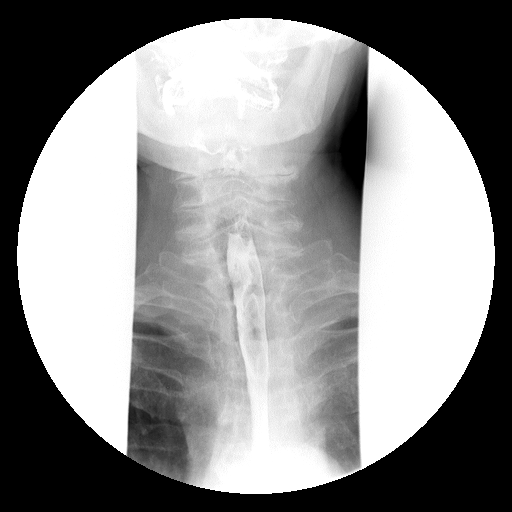
[im 7/12]
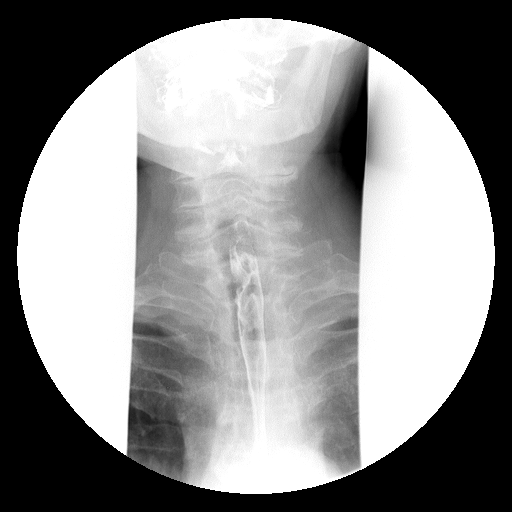
[im 8/12]
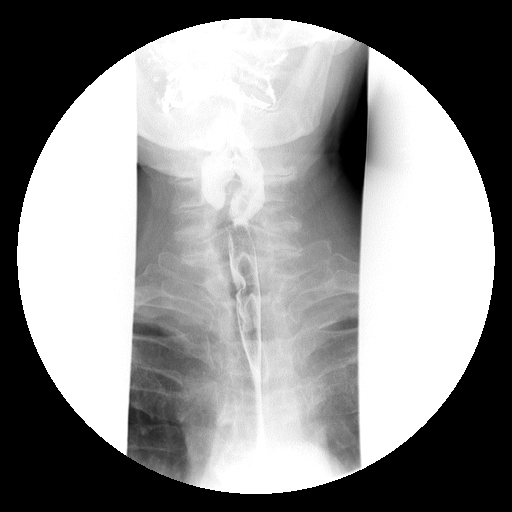
[im 10/12]
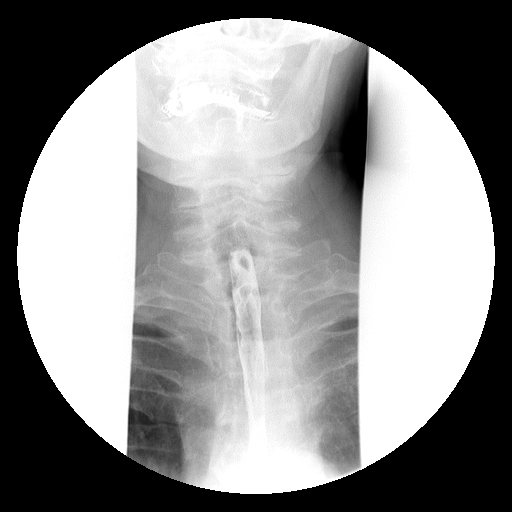

[Series 2: run · 6 of 11 slices shown (2 of 9)]
[im 1/11]
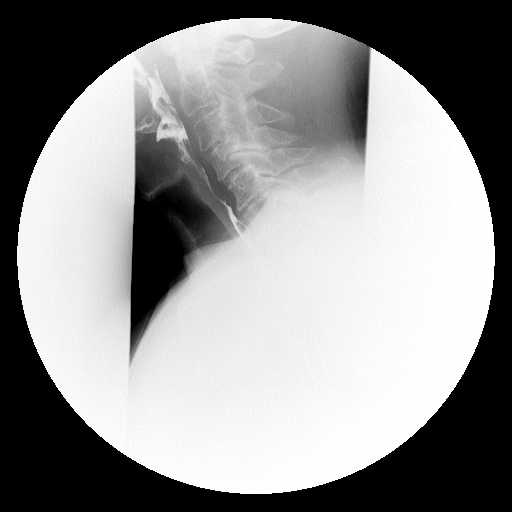
[im 2/11]
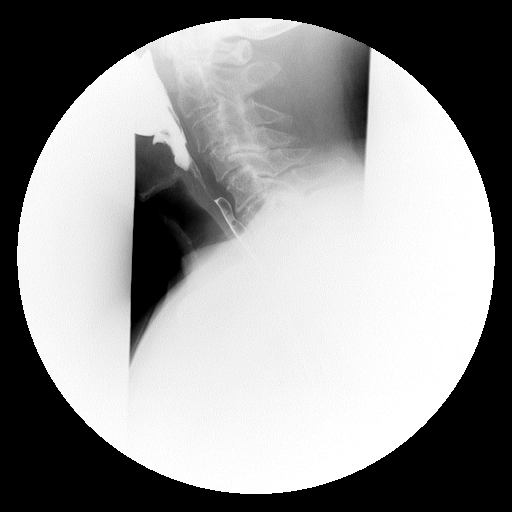
[im 4/11]
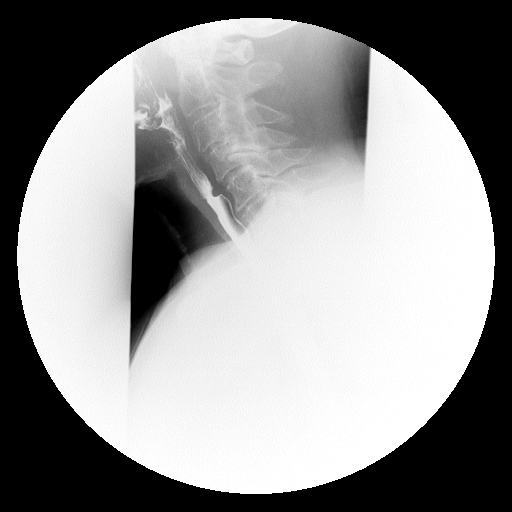
[im 7/11]
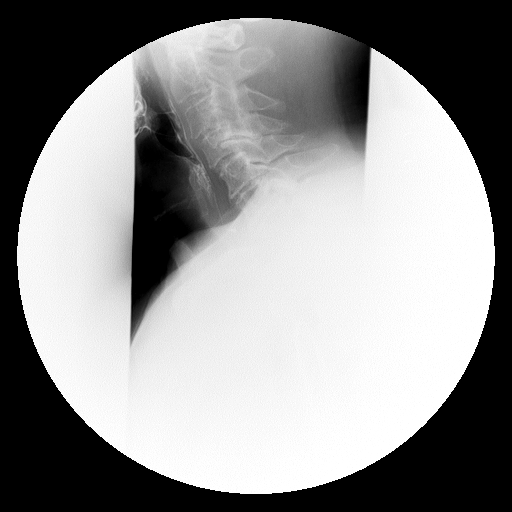
[im 9/11]
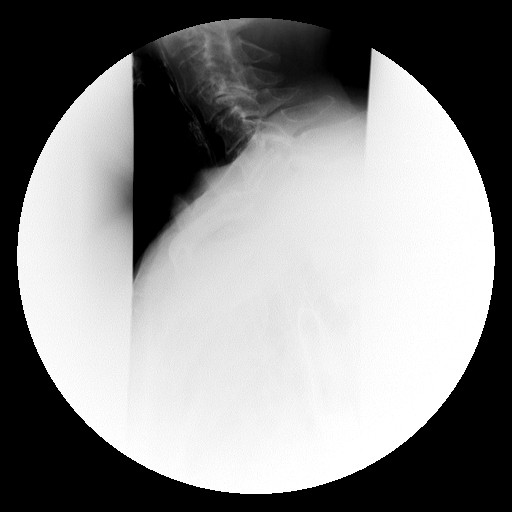
[im 11/11]
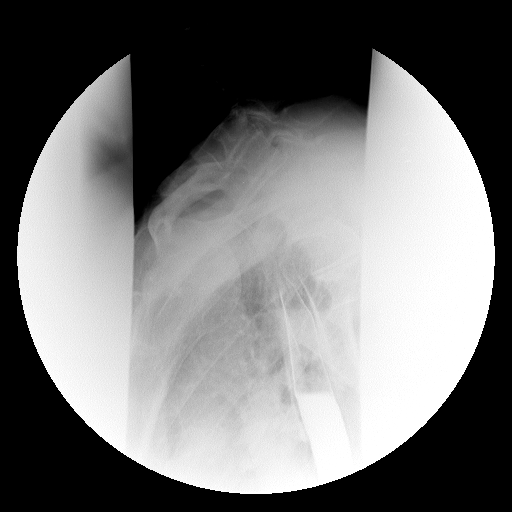

[Series 3: run · 1 of 1 slices shown (3 of 9)]
[im 1/1]
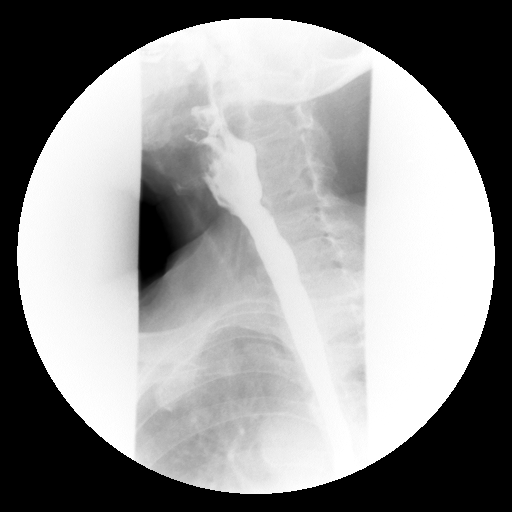

[Series 5: run · 1 of 1 slices shown (4 of 9)]
[im 1/1]
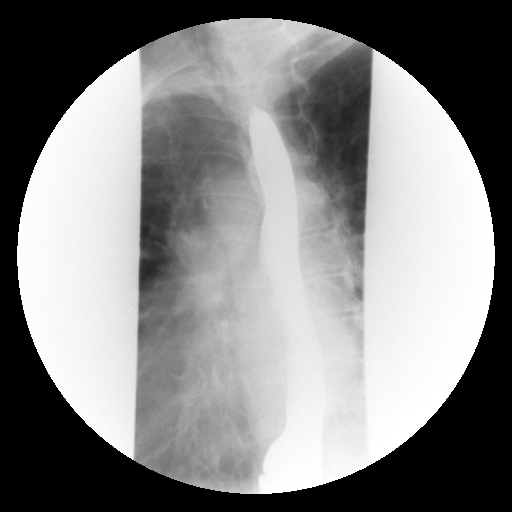

[Series 6: run · 1 of 1 slices shown (5 of 9)]
[im 1/1]
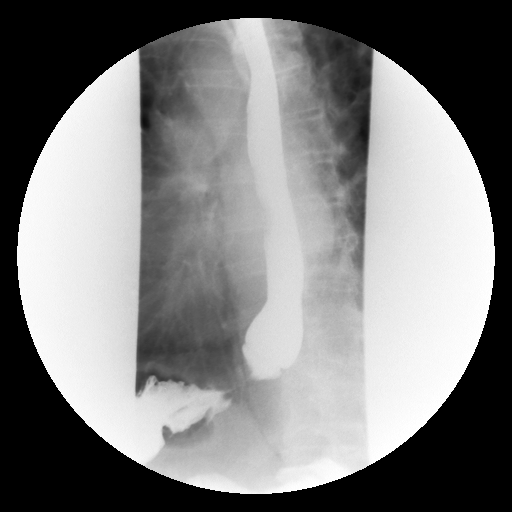

[Series 7: run · 1 of 1 slices shown (6 of 9)]
[im 1/1]
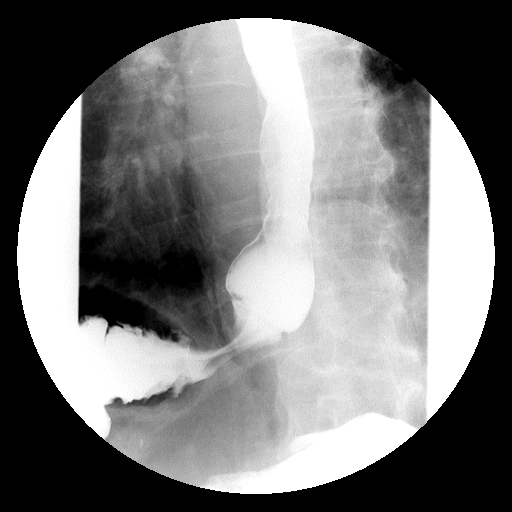

[Series 8: run · 1 of 1 slices shown (7 of 9)]
[im 1/1]
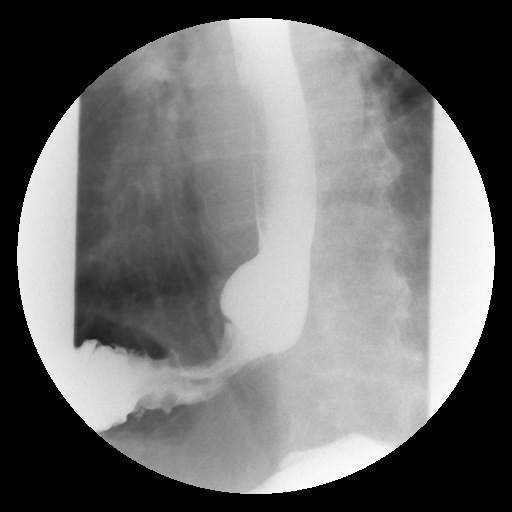

[Series 10: run · 1 of 1 slices shown (8 of 9)]
[im 1/1]
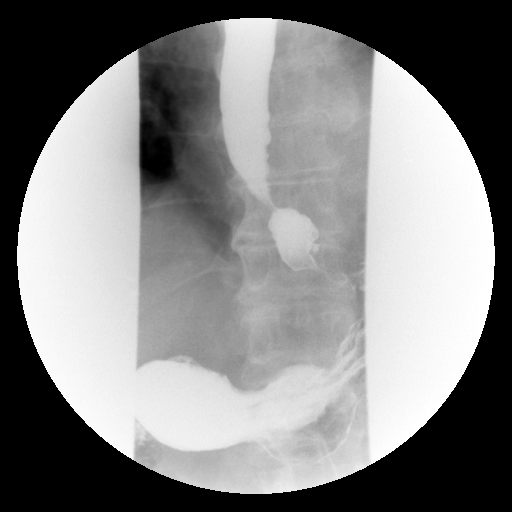

[Series 11: run · 1 of 1 slices shown (9 of 9)]
[im 1/1]
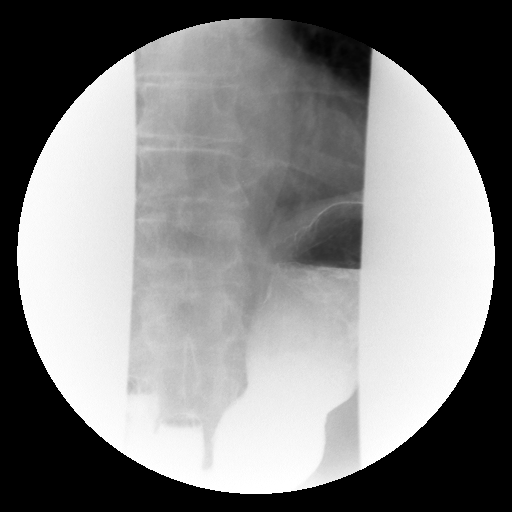

[19 of 24 positions shown; findings below may reference images not displayed]

FINDINGS: Initially rapid sequence spot films of the cervical
esophagus were performed.  The swallowing mechanism is
unremarkable.  There is slight prominence of the cricopharyngeus
muscle noted.  There are mild to moderate tertiary contractions in
the mid and distal esophagus.  A small hiatal hernia is present.
No reflux could be demonstrated however.  A barium pill was given
at the end of the study which did pass into the stomach without
delay.
IMPRESSION: 1.  Small hiatal hernia.  No definite reflux could be elicited.
2.  Mild to moderate tertiary contractions in the mid and distal
esophagus.
3.  Prominent cricopharyngeus muscle.
4.  Barium pill passes into the stomach without delay.

## 2014-07-03 ENCOUNTER — Ambulatory Visit (INDEPENDENT_AMBULATORY_CARE_PROVIDER_SITE_OTHER): Payer: Medicare Other | Admitting: Internal Medicine

## 2014-07-03 ENCOUNTER — Telehealth (HOSPITAL_COMMUNITY): Payer: Self-pay | Admitting: Interventional Radiology

## 2014-07-03 ENCOUNTER — Encounter: Payer: Self-pay | Admitting: Internal Medicine

## 2014-07-03 VITALS — BP 120/62 | HR 71 | Temp 98.5°F | Resp 18 | Ht 69.0 in | Wt 167.0 lb

## 2014-07-03 DIAGNOSIS — H3530 Unspecified macular degeneration: Secondary | ICD-10-CM

## 2014-07-03 DIAGNOSIS — Z8679 Personal history of other diseases of the circulatory system: Secondary | ICD-10-CM | POA: Diagnosis not present

## 2014-07-03 DIAGNOSIS — R413 Other amnesia: Secondary | ICD-10-CM

## 2014-07-03 DIAGNOSIS — D509 Iron deficiency anemia, unspecified: Secondary | ICD-10-CM

## 2014-07-03 DIAGNOSIS — E039 Hypothyroidism, unspecified: Secondary | ICD-10-CM

## 2014-07-03 DIAGNOSIS — M353 Polymyalgia rheumatica: Secondary | ICD-10-CM | POA: Diagnosis not present

## 2014-07-03 LAB — SEDIMENTATION RATE: SED RATE: 9 mm/h (ref 0–22)

## 2014-07-03 MED ORDER — METOPROLOL SUCCINATE ER 25 MG PO TB24: 12.5000 mg | ORAL_TABLET | Freq: Every day | ORAL | Status: AC

## 2014-07-03 MED ORDER — ALPRAZOLAM 0.25 MG PO TABS: 0.2500 mg | ORAL_TABLET | Freq: Two times a day (BID) | ORAL | Status: AC | PRN

## 2014-07-03 MED ORDER — ESCITALOPRAM OXALATE 10 MG PO TABS: 10.0000 mg | ORAL_TABLET | Freq: Every day | ORAL | Status: AC

## 2014-07-03 NOTE — Patient Instructions (Signed)
Decrease Toprol to 1 half tablet daily  Use alprazolam as needed  Call or return to clinic prn if these symptoms worsen or fail to improve as anticipated.

## 2014-07-03 NOTE — Telephone Encounter (Signed)
Called pt to discuss his recent CT head to see if he was having any symptoms. Pt states that he is having extreme feelings of dizziness and fatigue. We spoke about him having a cerebral catheter angiogram with Dr. Estanislado Pandy. He states that he is about to leave the country and go back to his home country, [place]. He states he will talk this over with his daughter and call me back. JM

## 2014-07-03 NOTE — Progress Notes (Signed)
Subjective:    Patient ID: [name], male    DOB: [birth year], 81 y.o.   MRN: [id number]  HPI 81 year old patient who is seen today in follow-up.  He has not done well since his last visit.  He has been quite weak anxious and also complains of dizziness, poor balance and short-term memory deficits.  He also complains of some mild headaches.  He has been on Lexapro due to the anxiety.  He has also been prescribed low-dose alprazolam, which she has not yet taken.  This was prescribed mainly to assist with anxiety and sleep on a transcontinental trip to [place], which is anticipated soon.  He has had a recent fall but no apparent head trauma  Hospital records including lab and recent head CT all reviewed  Past Medical History  Diagnosis Date  . IBS (irritable bowel syndrome)   . Hemorrhoids   . Diverticulosis   . GERD (gastroesophageal reflux disease)   . Tubular adenoma of colon 07/1998  . Anxiety and depression   . Elevated prostate specific antigen (PSA)   . Vitamin B12 deficiency   . UTI (lower urinary tract infection)   . Arthritis   . Sciatica   . Anemia   . TIA (transient ischemic attack)   . Peptic ulcer disease   . Hyperlipidemia   . CAD (coronary artery disease)     PCI 2006, Stress Perfusion Study 2011  . Hypertension   . Rheumatoid arthritis(714.0)   . Polymyalgia   . Hiatal hernia     History   Social History  . Marital Status: Married    Spouse Name: N/A  . Number of Children: 1  . Years of Education: N/A   Occupational History  . Retired English as a second language teacher    Social History Main Topics  . Smoking status: Former Smoker -- 0.25 packs/day for 20 years    Types: Cigarettes    Quit date: 03/30/1971  . Smokeless tobacco: Never Used  . Alcohol Use: No     Comment: occasional  . Drug Use: No  . Sexual Activity: Not Currently   Other Topics Concern  . Not on file   Social History Narrative    Past Surgical History  Procedure Laterality Date  . Ptca  2006   Stenting of proximal LAD w/ 75 % stenosis reduced to 0%  . Cataract extraction Bilateral   . Cholecystectomy  2001  . Hernia repair    . Hip fracture surgery  03/2007    Right  . Salivary stone removal  11/2007  . Radiology with anesthesia N/A 11/16/2012    Procedure: EMBOLIZATION  OF FISTULA;  Surgeon: Rob Hickman, MD;  Location: [facility];  Service: Radiology;  Laterality: N/A;  . 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 Colon cancer Maternal Uncle 55    ish  . Coronary artery disease Father     Allergies  Allergen Reactions  . Amoxicillin Rash  . Penicillins Rash    Current Outpatient Prescriptions on File Prior to Visit  Medication Sig Dispense Refill  . ALPRAZolam (XANAX) 0.25 MG tablet Take 1 tablet (0.25 mg total) by mouth 2 (two) times daily as needed for anxiety. 40 tablet 0  . aspirin 81 MG tablet Take 81 mg by mouth daily.    . Cholecalciferol (VITAMIN D PO) Take 2,000 mg by mouth daily.    . Coenzyme Q10 (CO Q-10) 100 MG  CAPS Take 1 capsule by mouth daily.    Marland Kitchen desonide (DESOWEN) 0.05 % ointment Apply 1 application topically 2 (two) times daily as needed (for skin).     Marland Kitchen escitalopram (LEXAPRO) 10 MG tablet Take 1 tablet (10 mg total) by mouth daily. 90 tablet 3  . fluticasone (FLONASE) 50 MCG/ACT nasal spray Place 2 sprays into both nostrils daily. (Patient taking differently: Place 2 sprays into both nostrils daily as needed for allergies or rhinitis. ) 15.8 g 6  . metoprolol succinate (TOPROL-XL) 25 MG 24 hr tablet Take 1 tablet (25 mg total) by mouth daily. 90 tablet 1  . multivitamin-lutein (OCUVITE-LUTEIN) CAPS capsule Take 1 capsule by mouth 2 (two) times daily.    . nitroGLYCERIN (NITROSTAT) 0.4 MG SL tablet Place 1 tablet (0.4 mg total) under the tongue every 5 (five) minutes as needed. (Patient taking differently: Place 0.4 mg under the tongue every 5 (five) minutes as needed for chest pain. ) 25 tablet  6  . omeprazole (PRILOSEC) 40 MG capsule Take 1 capsule (40 mg total) by mouth 2 (two) times daily. 60 capsule 11  . vitamin B-12 (CYANOCOBALAMIN) 1000 MCG tablet Take 1,000 mcg by mouth daily.    . Dietary Management Product (AXONA) packet Take 40 g by mouth daily. (Patient not taking: Reported on 07/03/2014) 30 packet 6   No current facility-administered medications on file prior to visit.    BP 120/62 mmHg  Pulse 71  Temp(Src) 98.5 F (36.9 C) (Oral)  Resp 18  Ht 5\' 9"  (1.753 m)  Wt 167 lb (75.751 kg)  BMI 24.65 kg/m2  SpO2 98%      Review of Systems  Constitutional: Positive for activity change and fatigue. Negative for fever, chills and appetite change.  HENT: Negative for congestion, dental problem, ear pain, hearing loss, sore throat, tinnitus, trouble swallowing and voice change.   Eyes: Negative for pain, discharge and visual disturbance.  Respiratory: Negative for cough, chest tightness, wheezing and stridor.   Cardiovascular: Negative for chest pain, palpitations and leg swelling.  Gastrointestinal: Negative for nausea, vomiting, abdominal pain, diarrhea, constipation, blood in stool and abdominal distention.  Genitourinary: Negative for urgency, hematuria, flank pain, discharge, difficulty urinating and genital sores.  Musculoskeletal: Negative for myalgias, back pain, joint swelling, arthralgias, gait problem and neck stiffness.  Skin: Negative for rash.  Neurological: Positive for dizziness and weakness. Negative for syncope, speech difficulty, numbness and headaches.  Hematological: Negative for adenopathy. Does not bruise/bleed easily.  Psychiatric/Behavioral: Positive for behavioral problems and decreased concentration. Negative for dysphoric mood. The patient is nervous/anxious.        Objective:   Physical Exam  Constitutional: He is oriented to person, place, and time. He appears well-developed.  Weak, frail, ambulatory.  Able to stand from a sitting  position and ambulate to the examining table without assistance Slightly unsteady  Blood pressure 110/60  HENT:  Head: Normocephalic.  Right Ear: External ear normal.  Left Ear: External ear normal.  Eyes: Conjunctivae and EOM are normal.  Neck: Normal range of motion.  Right bruit  Cardiovascular: Normal rate and normal heart sounds.   Pulmonary/Chest: Breath sounds normal.  Abdominal: Bowel sounds are normal.  Musculoskeletal: Normal range of motion. He exhibits no edema or tenderness.  Neurological: He is alert and oriented to person, place, and time.  Psychiatric: He has a normal mood and affect. His behavior is normal.          Assessment & Plan:   Anxiety,  depression.  Will increase Lexapro to 20 mg daily Coronary artery disease.  Blood pressure low normal.  We'll decrease metoprolol to 25 mg daily Carotid artery disease History of polymyalgia rheumatica.  No change in therapy  The patient will be relocating to [place] Will return here when necessary

## 2014-07-03 NOTE — Progress Notes (Signed)
Pre visit review using our clinic review tool, if applicable. No additional management support is needed unless otherwise documented below in the visit note. 

## 2014-07-08 ENCOUNTER — Telehealth: Payer: Self-pay | Admitting: *Deleted

## 2014-07-08 NOTE — Telephone Encounter (Signed)
Pt's wife Jana Half calling for pt's lab result from 4/6, told her Sed rate was normal at 9. Jana Half verbalized understanding and will let pt know.

## 2014-07-10 ENCOUNTER — Ambulatory Visit: Payer: Medicare Other | Admitting: Internal Medicine

## 2014-07-12 DIAGNOSIS — H9311 Tinnitus, right ear: Secondary | ICD-10-CM | POA: Diagnosis not present

## 2014-07-12 DIAGNOSIS — H905 Unspecified sensorineural hearing loss: Secondary | ICD-10-CM | POA: Diagnosis not present

## 2014-07-12 DIAGNOSIS — J310 Chronic rhinitis: Secondary | ICD-10-CM | POA: Diagnosis not present

## 2014-07-29 ENCOUNTER — Telehealth: Payer: Self-pay | Admitting: Internal Medicine

## 2014-07-29 ENCOUNTER — Ambulatory Visit (INDEPENDENT_AMBULATORY_CARE_PROVIDER_SITE_OTHER): Payer: Medicare Other | Admitting: Adult Health

## 2014-07-29 VITALS — BP 110/60 | HR 72 | Temp 98.1°F | Wt 165.4 lb

## 2014-07-29 DIAGNOSIS — R630 Anorexia: Secondary | ICD-10-CM

## 2014-07-29 DIAGNOSIS — R42 Dizziness and giddiness: Secondary | ICD-10-CM

## 2014-07-29 DIAGNOSIS — R079 Chest pain, unspecified: Secondary | ICD-10-CM

## 2014-07-29 MED ORDER — MIRTAZAPINE 15 MG PO TABS: 15.0000 mg | ORAL_TABLET | Freq: Every day | ORAL | Status: AC

## 2014-07-29 NOTE — Telephone Encounter (Signed)
Attempted to call patient to confirm stability but contact # states # is no longer in service. Will ensure patient makes appointment and will follow-up once arrived for acuteness and update of contact info.

## 2014-07-29 NOTE — Telephone Encounter (Signed)
Patient Name: [name]  DOB: [birth year]    Initial Comment Caller states husband is very weak and tired, bp 90/50, can't hardy walk, can't remember things   Nurse Assessment  Nurse: Leilani Merl, RN, Nira Conn Date/Time (Eastern Time): 07/29/2014 8:59:51 AM  Confirm and document reason for call. If symptomatic, describe symptoms. ---Caller states husband is very weak and tired, bp 90/50, he is dizzy, can't hardy walk, can't remember things. This started on Friday night  Has the patient traveled out of the country within the last 30 days? ---Not Applicable  Does the patient require triage? ---Yes  Related visit to physician within the last 2 weeks? ---No  Does the PT have any chronic conditions? (i.e. diabetes, asthma, etc.) ---Unknown     Guidelines    Guideline Title Affirmed Question Affirmed Notes  Chest Pain [1] Chest pain lasts > 5 minutes AND [2] age > 45    Final Disposition User   Call EMS [facility] [place], RN, [facility] office backline, [phone] and information given to Mariano Colan about patient refusal to call 911. Pt. wants to be seen at the office.

## 2014-07-29 NOTE — Progress Notes (Signed)
Pre visit review using our clinic review tool, if applicable. No additional management support is needed unless otherwise documented below in the visit note. 

## 2014-07-29 NOTE — Progress Notes (Signed)
   Subjective:    Patient ID: [name], male    DOB: [birth year], 81 y.o.   MRN: [id number]  HPI Patient presents to the office today for multiple complaints   He states " I am weak and I do not have an appetite anymore". He states that food does not taste good, this has been going on for a week. He eats very little and drinks very little fluids.   He also feels that he is off balance and dizzy- this has been going on for acouple of months but has been getting worse.   He feels as though his memory is not as good as it once was.   He had chest pain over the weekend but did not go to the ER. It happens intermittently and has been going on for months as well. He was seen by Cardiology one week ago and was told that " nothing was wrong." Chest pain is becoming more frequent and is mid sternal, feels as though it is a "squeezing" pain.   Is not taking Lexapro for mood stabilization  They are moving back to [place] this month.   Review of Systems  Constitutional: Positive for activity change, appetite change, fatigue and unexpected weight change. Negative for fever.  HENT: Negative.   Eyes: Negative.   Respiratory: Negative.   Cardiovascular: Positive for chest pain and palpitations. Negative for leg swelling.  Gastrointestinal: Negative.   Musculoskeletal: Positive for gait problem. Negative for myalgias, back pain, joint swelling, arthralgias and neck pain.  Skin: Negative.   Neurological: Positive for dizziness, weakness and light-headedness. Negative for facial asymmetry, speech difficulty and headaches.  All other systems reviewed and are negative.      Objective:   Physical Exam  Constitutional: He is oriented to person, place, and time. He appears well-developed and well-nourished. No distress.  HENT:  Head: Normocephalic and atraumatic.  Right Ear: External ear normal.  Left Ear: External ear normal.  Eyes: Right eye exhibits no discharge. Left eye exhibits no discharge.   Neck: No thyromegaly present.  Cardiovascular: Normal rate, regular rhythm, normal heart sounds and intact distal pulses.  Exam reveals no gallop and no friction rub.   No murmur heard. Pulmonary/Chest: Effort normal and breath sounds normal. No respiratory distress. He has no wheezes. He has no rales. He exhibits no tenderness.  Abdominal: Soft. Bowel sounds are normal. He exhibits no distension and no mass. There is no tenderness. There is no rebound and no guarding.  Musculoskeletal: Normal range of motion. He exhibits no tenderness.  Lymphadenopathy:    He has no cervical adenopathy.  Neurological: He is alert and oriented to person, place, and time. No cranial nerve deficit. Coordination normal.  Skin: Skin is warm and dry. No rash noted. No erythema. No pallor.  Psychiatric:  Flat affect His memory does not appear to be diminished.   Vitals reviewed.      Assessment & Plan:  1. Intermittent chest pain - EKG 12-Lead - unremarkable - Follow up with Cardiology as needed - Go to the ER if he continues to experience chest discomfort.   2. Loss of appetite - Remeron 15mg  nightly prescribed  3. Dizziness - Likely due to dehydration and malnutrition.  - Follow up if no better after starting Remeron

## 2014-07-29 NOTE — Patient Instructions (Signed)
I have sent a prescription for Remeron to your pharmacy. Please take this every evening. This medication will help with your mood and also with your appetite. I think a majority of your symptoms are related to not eating and not drinking. Please be careful moving around until you feel as though your balance is better.   Take the Metoprolol every other day for the next two weeks and then you can stop it all together. Continue to take check your blood pressure.   Follow up as needed and good luck with your move.

## 2014-08-14 ENCOUNTER — Telehealth: Payer: Self-pay | Admitting: Internal Medicine

## 2014-08-14 NOTE — Telephone Encounter (Signed)
Patient is going on vacation and needs extended refills-- the ones he needs is Prednisone and Omeprazole.   Mr. Windsor is staying at [facility], the phone number listed, Room 245.

## 2014-08-15 MED ORDER — PREDNISONE 2.5 MG PO TABS: 2.5000 mg | ORAL_TABLET | Freq: Every day | ORAL | Status: AC

## 2014-08-15 MED ORDER — OMEPRAZOLE 40 MG PO CPDR: 40.0000 mg | DELAYED_RELEASE_CAPSULE | Freq: Two times a day (BID) | ORAL | Status: AC

## 2014-08-15 NOTE — Telephone Encounter (Signed)
Spoke to pt, told him Rx's sent to pharmacy for 90 day supply. Pt verbalized understanding.

## 2014-09-23 ENCOUNTER — Other Ambulatory Visit: Payer: Self-pay

## 2014-12-27 IMAGING — CR DG CHEST 2V
2 series · 2 of 2 positions shown · non-contrast
Comparison: 11/15/2012; 11/07/2012; 04/24/2007

CLINICAL DATA: Cough for 4 months, subsequent encounter.

CHEST - 2 VIEW

[view not recorded (1 of 2)]
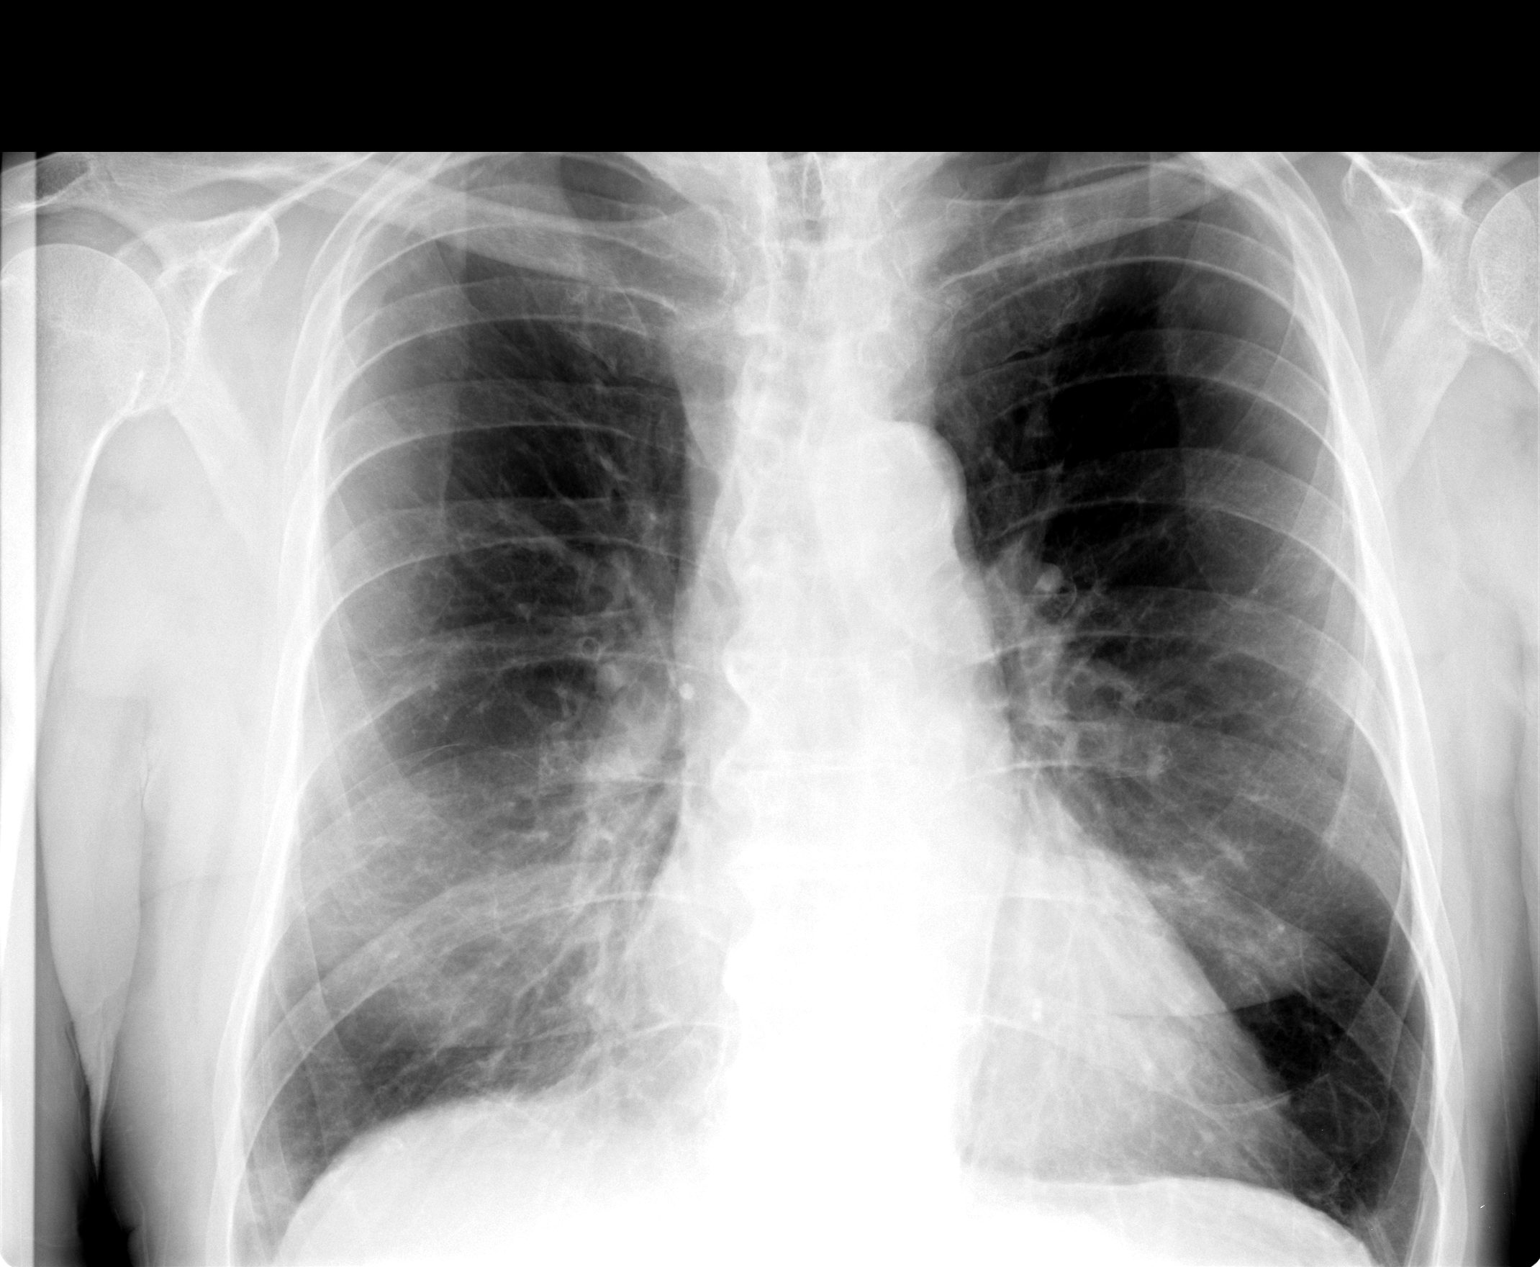

[view not recorded (2 of 2)]
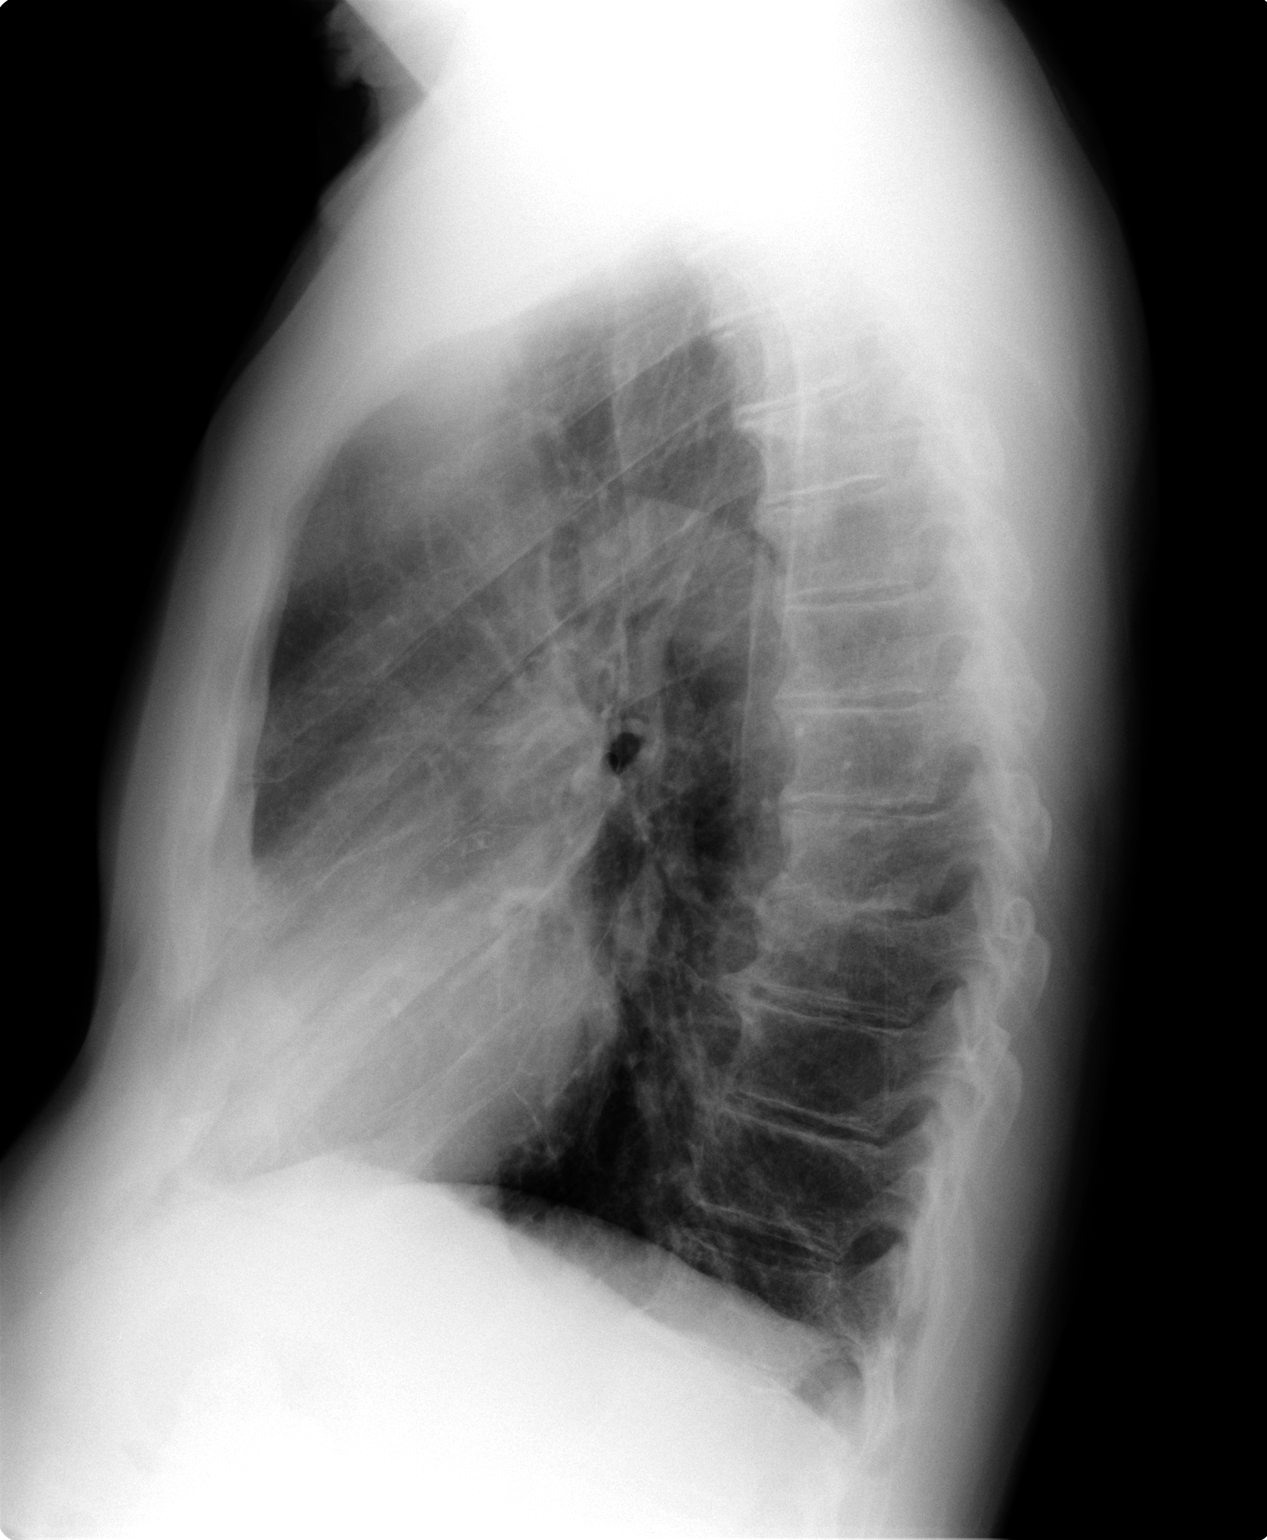

[2 of 2 positions shown; findings below may reference images not displayed]

FINDINGS: Grossly unchanged enlarged cardiac silhouette and mediastinal
contours.  The lungs remain hyperexpanded with flattening of the
bilateral hemidiaphragms and mild diffuse slightly nodular
thickening of the pulmonary interstitium. The previously question
nipple shadow overlying the peripheral aspect of the right lower
lung is not definitely seen on the present examination.  No focal
airspace opacities.  No pleural effusion or pneumothorax.  No
evidence of edema.  Grossly unchanged bones.
IMPRESSION: Hyperexpanded lungs and bronchitic change without acute
cardiopulmonary disease.

## 2015-02-03 ENCOUNTER — Encounter: Payer: Self-pay | Admitting: Gastroenterology

## 2016-12-16 ENCOUNTER — Encounter: Payer: Self-pay | Admitting: Internal Medicine

## 2019-02-27 DEATH — deceased
